# Patient Record
Sex: Male | Born: 1965 | Race: White | Hispanic: No | Marital: Married | Smoking: Current every day smoker
Health system: Southern US, Community
[De-identification: ages and names within clinical notes are randomized; demographics above are authoritative.]

## PROBLEM LIST (undated history)

## (undated) DIAGNOSIS — K219 Gastro-esophageal reflux disease without esophagitis: Secondary | ICD-10-CM

## (undated) DIAGNOSIS — Z8614 Personal history of Methicillin resistant Staphylococcus aureus infection: Secondary | ICD-10-CM

## (undated) DIAGNOSIS — R0602 Shortness of breath: Secondary | ICD-10-CM

## (undated) DIAGNOSIS — I471 Supraventricular tachycardia, unspecified: Secondary | ICD-10-CM

## (undated) DIAGNOSIS — M7110 Other infective bursitis, unspecified site: Secondary | ICD-10-CM

## (undated) DIAGNOSIS — J189 Pneumonia, unspecified organism: Secondary | ICD-10-CM

## (undated) DIAGNOSIS — M1990 Unspecified osteoarthritis, unspecified site: Secondary | ICD-10-CM

## (undated) DIAGNOSIS — Z9889 Other specified postprocedural states: Principal | ICD-10-CM

## (undated) DIAGNOSIS — J449 Chronic obstructive pulmonary disease, unspecified: Secondary | ICD-10-CM

## (undated) DIAGNOSIS — M0630 Rheumatoid nodule, unspecified site: Secondary | ICD-10-CM

## (undated) DIAGNOSIS — T7840XA Allergy, unspecified, initial encounter: Secondary | ICD-10-CM

## (undated) DIAGNOSIS — A4901 Methicillin susceptible Staphylococcus aureus infection, unspecified site: Secondary | ICD-10-CM

## (undated) HISTORY — DX: Personal history of Methicillin resistant Staphylococcus aureus infection: Z86.14

## (undated) HISTORY — PX: CHOLECYSTECTOMY: SHX55

## (undated) HISTORY — DX: Other infective bursitis, unspecified site: M71.10

## (undated) HISTORY — DX: Methicillin susceptible Staphylococcus aureus infection, unspecified site: A49.01

## (undated) HISTORY — DX: Gastro-esophageal reflux disease without esophagitis: K21.9

## (undated) HISTORY — DX: Other specified postprocedural states: Z98.890

## (undated) HISTORY — PX: COLONOSCOPY: SHX174

## (undated) HISTORY — PX: EPIDURAL BLOCK INJECTION: SHX1516

## (undated) HISTORY — PX: EYE SURGERY: SHX253

## (undated) HISTORY — DX: Rheumatoid nodule, unspecified site: M06.30

## (undated) HISTORY — PX: BACK SURGERY: SHX140

## (undated) HISTORY — PX: OTHER SURGICAL HISTORY: SHX169

## (undated) HISTORY — DX: Allergy, unspecified, initial encounter: T78.40XA

---

## 2000-09-15 ENCOUNTER — Emergency Department (HOSPITAL_COMMUNITY): Admission: EM | Admit: 2000-09-15 | Discharge: 2000-09-15 | Payer: Self-pay | Admitting: Internal Medicine

## 2003-08-11 ENCOUNTER — Encounter: Admission: RE | Admit: 2003-08-11 | Discharge: 2003-08-11 | Payer: Self-pay | Admitting: Family Medicine

## 2004-05-30 ENCOUNTER — Ambulatory Visit: Payer: Self-pay | Admitting: Internal Medicine

## 2004-10-22 ENCOUNTER — Encounter: Admission: RE | Admit: 2004-10-22 | Discharge: 2004-10-22 | Payer: Self-pay | Admitting: Family Medicine

## 2004-11-22 ENCOUNTER — Ambulatory Visit (HOSPITAL_COMMUNITY): Admission: RE | Admit: 2004-11-22 | Discharge: 2004-11-22 | Payer: Self-pay | Admitting: General Surgery

## 2005-01-03 ENCOUNTER — Ambulatory Visit (HOSPITAL_COMMUNITY): Admission: RE | Admit: 2005-01-03 | Discharge: 2005-01-03 | Payer: Self-pay | Admitting: General Surgery

## 2005-01-03 ENCOUNTER — Encounter (INDEPENDENT_AMBULATORY_CARE_PROVIDER_SITE_OTHER): Payer: Self-pay | Admitting: Specialist

## 2005-04-15 ENCOUNTER — Emergency Department (HOSPITAL_COMMUNITY): Admission: EM | Admit: 2005-04-15 | Discharge: 2005-04-15 | Payer: Self-pay | Admitting: Emergency Medicine

## 2005-11-28 ENCOUNTER — Emergency Department (HOSPITAL_COMMUNITY): Admission: EM | Admit: 2005-11-28 | Discharge: 2005-11-28 | Payer: Self-pay | Admitting: Emergency Medicine

## 2006-03-10 ENCOUNTER — Ambulatory Visit: Payer: Self-pay | Admitting: Gastroenterology

## 2006-03-26 ENCOUNTER — Ambulatory Visit: Payer: Self-pay | Admitting: Gastroenterology

## 2006-06-12 ENCOUNTER — Emergency Department (HOSPITAL_COMMUNITY): Admission: EM | Admit: 2006-06-12 | Discharge: 2006-06-12 | Payer: Self-pay | Admitting: Emergency Medicine

## 2009-05-18 ENCOUNTER — Ambulatory Visit (HOSPITAL_COMMUNITY): Admission: RE | Admit: 2009-05-18 | Discharge: 2009-05-18 | Payer: Self-pay | Admitting: Rheumatology

## 2010-08-23 NOTE — Op Note (Signed)
NAME:  Jeff Russell, Jeff Russell               ACCOUNT NO.:  [id number]   MEDICAL RECORD NO.:  [id number]          PATIENT TYPE:  AMB   LOCATION:  DAY                          FACILITY:  [facility]   PHYSICIAN:  Leonie Man, M.D.   DATE OF BIRTH:  [birth year]   DATE OF PROCEDURE:  01/03/2005  DATE OF DISCHARGE:                                 OPERATIVE REPORT   PREOPERATIVE DIAGNOSIS:  Chronic cholecystitis and biliary dyskinesia.   POSTOPERATIVE DIAGNOSIS:  Chronic cholecystitis and biliary dyskinesia.   PROCEDURE:  Laparoscopic cholecystectomy with intraoperative cholangiogram.   SURGEON:  Leonie Man, M.D.   ASSISTANT:  Consuello Bossier, M.D.   ANESTHESIA:  General.   SPECIMENS TO LAB:  Gallbladder.   ESTIMATED BLOOD LOSS:  Minimal.   COMPLICATIONS:  None.   The patient returned to the PACU in excellent condition.   HISTORY:  The patient is a 45 year old man presenting with epigastric pain  and associated nausea. He had an ultrasound which showed sludge and some  gallbladder wall thickening. Gallbladder function studies showed he had a  gallbladder ejection fraction of approximately 20 percent at 1 hour and at 4  hours. The patient comes to the operation today for laparoscopic  cholecystectomy and cholangiogram after the risks and potential benefits of  surgery have been fully discussed with him all, questions answered and  consent obtained.   DESCRIPTION OF PROCEDURE:  Following induction of satisfactory general  anesthesia with the patient positioned supinely, the abdomen was routinely  prepped and draped to be included in a sterile operative field. Open  laparoscopy created at the umbilicus with insertion of a Hassan cannula and  insufflation of the peritoneal cavity to 14 mmHg pressure using carbon  dioxide. Camera was inserted and the patient positioned. Visual exploration  of the abdomen showed the gallbladder be chronically scarred. The liver  edges were sharp, liver  surfaces smooth, duodenal sweep and anterior gastric  wall appeared to be normal. There were no abnormalities noted within the  area of the small bowel or large intestine noted. Under direct vision,  epigastric and lateral ports were placed gallbladder was grasped and  retracted cephalad. Dissection carried down to the region of the ampulla  with isolation of the cystic artery and cystic duct, the cystic duct being  traced up to the gallbladder cystic duct junction and cystic artery being  traced to its entry into the gallbladder wall. The cystic duct was clipped  proximally and opened, the cystic duct cholangiogram was done by passing a  Cook catheter into the abdomen into the cystic duct through which I injected  one-half strength Hypaque dye under fluoroscopic guidance. The resulting  cholangiogram showed free flow of contrast into the duodenum, normal caliber  ducts and no filling defects. The cholangiocatheter was removed and the  cystic duct triply clipped and transected. The cystic artery isolated,  triply clipped and transected and the gallbladder dissected free from the  liver bed using electrocautery and maintaining hemostasis throughout the  course of the dissection. At the end of this dissection, the liver bed was  again thoroughly checked  for hemostasis and additional bleeding points  treated electrocautery. The gallbladder was placed in an Endopouch and  retrieved through the umbilical wound without difficulty. The right upper  quadrant was thoroughly irrigated and aspirated. The sponge, instrument and  sharp counts were verified and the pneumoperitoneum released after the  trocars were removed under direct vision. The wounds were then closed in  layers as follows. The umbilical wound in two layers with #0 Vicryl and 4-0  Monocryl, epigastric and lateral flank wounds closed with 4-0 Monocryl  sutures. All wounds were reinforced with Steri-Strips and sterile dressings  applied.  Anesthetic reversed. The patient removed from the operating room to  the recovery room in stable condition. He tolerated the procedure well.      Leonie Man, M.D.  Electronically Signed     PB/MEDQ  D:  01/03/2005  T:  01/03/2005  Job:  [id number]

## 2010-10-02 ENCOUNTER — Ambulatory Visit
Admission: RE | Admit: 2010-10-02 | Discharge: 2010-10-02 | Disposition: A | Discharge status: Home / Self Care | Payer: BC Managed Care – PPO | Source: Ambulatory Visit | Attending: Rheumatology | Admitting: Rheumatology

## 2010-10-02 ENCOUNTER — Other Ambulatory Visit: Payer: Self-pay | Admitting: Rheumatology

## 2010-10-02 DIAGNOSIS — M0520 Rheumatoid vasculitis with rheumatoid arthritis of unspecified site: Secondary | ICD-10-CM

## 2011-01-14 ENCOUNTER — Other Ambulatory Visit: Payer: Self-pay | Admitting: Family Medicine

## 2011-01-14 ENCOUNTER — Ambulatory Visit
Admission: RE | Admit: 2011-01-14 | Discharge: 2011-01-14 | Disposition: A | Discharge status: Home / Self Care | Payer: BC Managed Care – PPO | Source: Ambulatory Visit | Attending: Family Medicine | Admitting: Family Medicine

## 2011-03-24 ENCOUNTER — Encounter (HOSPITAL_COMMUNITY): Payer: Self-pay

## 2011-03-24 ENCOUNTER — Encounter (HOSPITAL_COMMUNITY): Payer: Self-pay | Admitting: General Practice

## 2011-03-24 ENCOUNTER — Inpatient Hospital Stay (HOSPITAL_COMMUNITY)
Admission: AD | Admit: 2011-03-24 | Discharge: 2011-03-28 | DRG: 264 | Disposition: A | Discharge status: Home Health | Payer: BC Managed Care – PPO | Source: Ambulatory Visit | Attending: Orthopedic Surgery | Admitting: Orthopedic Surgery

## 2011-03-24 ENCOUNTER — Other Ambulatory Visit: Payer: Self-pay

## 2011-03-24 ENCOUNTER — Other Ambulatory Visit: Payer: Self-pay | Admitting: Physician Assistant

## 2011-03-24 ENCOUNTER — Encounter (HOSPITAL_COMMUNITY)
Admission: AD | Disposition: A | Discharge status: Home Health | Payer: Self-pay | Source: Ambulatory Visit | Attending: Orthopedic Surgery

## 2011-03-24 ENCOUNTER — Inpatient Hospital Stay (HOSPITAL_COMMUNITY): Payer: BC Managed Care – PPO

## 2011-03-24 DIAGNOSIS — M76899 Other specified enthesopathies of unspecified lower limb, excluding foot: Secondary | ICD-10-CM | POA: Diagnosis present

## 2011-03-24 DIAGNOSIS — K219 Gastro-esophageal reflux disease without esophagitis: Secondary | ICD-10-CM | POA: Diagnosis present

## 2011-03-24 DIAGNOSIS — M00869 Arthritis due to other bacteria, unspecified knee: Secondary | ICD-10-CM

## 2011-03-24 DIAGNOSIS — L988 Other specified disorders of the skin and subcutaneous tissue: Secondary | ICD-10-CM | POA: Diagnosis present

## 2011-03-24 DIAGNOSIS — F17200 Nicotine dependence, unspecified, uncomplicated: Secondary | ICD-10-CM | POA: Diagnosis present

## 2011-03-24 DIAGNOSIS — R0602 Shortness of breath: Secondary | ICD-10-CM | POA: Diagnosis present

## 2011-03-24 DIAGNOSIS — L03119 Cellulitis of unspecified part of limb: Principal | ICD-10-CM | POA: Diagnosis present

## 2011-03-24 DIAGNOSIS — A4901 Methicillin susceptible Staphylococcus aureus infection, unspecified site: Secondary | ICD-10-CM | POA: Diagnosis present

## 2011-03-24 DIAGNOSIS — L02419 Cutaneous abscess of limb, unspecified: Principal | ICD-10-CM | POA: Diagnosis present

## 2011-03-24 DIAGNOSIS — M069 Rheumatoid arthritis, unspecified: Secondary | ICD-10-CM | POA: Diagnosis present

## 2011-03-24 HISTORY — PX: I&D EXTREMITY: SHX5045

## 2011-03-24 HISTORY — DX: Pneumonia, unspecified organism: J18.9

## 2011-03-24 HISTORY — DX: Unspecified osteoarthritis, unspecified site: M19.90

## 2011-03-24 HISTORY — DX: Shortness of breath: R06.02

## 2011-03-24 LAB — URINE MICROSCOPIC-ADD ON

## 2011-03-24 LAB — SURGICAL PCR SCREEN: MRSA, PCR: NEGATIVE

## 2011-03-24 LAB — URINALYSIS, ROUTINE W REFLEX MICROSCOPIC
Glucose, UA: NEGATIVE mg/dL
Ketones, ur: NEGATIVE mg/dL
Nitrite: NEGATIVE
Specific Gravity, Urine: 1.025 (ref 1.005–1.030)
pH: 7 (ref 5.0–8.0)

## 2011-03-24 LAB — DIFFERENTIAL
Basophils Relative: 0 % (ref 0–1)
Lymphocytes Relative: 10 % — ABNORMAL LOW (ref 12–46)
Lymphs Abs: 1.4 10*3/uL (ref 0.7–4.0)
Monocytes Absolute: 1.1 10*3/uL — ABNORMAL HIGH (ref 0.1–1.0)
Monocytes Relative: 8 % (ref 3–12)
Neutro Abs: 11.7 10*3/uL — ABNORMAL HIGH (ref 1.7–7.7)
Neutrophils Relative %: 82 % — ABNORMAL HIGH (ref 43–77)

## 2011-03-24 LAB — PROTIME-INR: INR: 1.08 (ref 0.00–1.49)

## 2011-03-24 LAB — CBC
HCT: 42.5 % (ref 39.0–52.0)
MCHC: 33.9 g/dL (ref 30.0–36.0)
MCV: 99.1 fL (ref 78.0–100.0)
Platelets: 216 10*3/uL (ref 150–400)
RDW: 12.9 % (ref 11.5–15.5)
WBC: 14.3 10*3/uL — ABNORMAL HIGH (ref 4.0–10.5)

## 2011-03-24 LAB — COMPREHENSIVE METABOLIC PANEL
AST: 24 U/L (ref 0–37)
Albumin: 3.7 g/dL (ref 3.5–5.2)
BUN: 9 mg/dL (ref 6–23)
Calcium: 9.8 mg/dL (ref 8.4–10.5)
Chloride: 99 mEq/L (ref 96–112)
Creatinine, Ser: 0.93 mg/dL (ref 0.50–1.35)
Total Bilirubin: 0.6 mg/dL (ref 0.3–1.2)
Total Protein: 8.4 g/dL — ABNORMAL HIGH (ref 6.0–8.3)

## 2011-03-24 LAB — APTT: aPTT: 29 seconds (ref 24–37)

## 2011-03-24 SURGERY — IRRIGATION AND DEBRIDEMENT EXTREMITY
Anesthesia: General | Site: Knee | Laterality: Right | Wound class: Dirty or Infected

## 2011-03-24 MED ORDER — HYDROMORPHONE 0.3 MG/ML IV SOLN
INTRAVENOUS | Status: DC
  Administered 2011-03-24: 22:00:00 via INTRAVENOUS
  Administered 2011-03-25: 1.8 mg via INTRAVENOUS
  Administered 2011-03-25 (×2): 0.9 mg via INTRAVENOUS
  Administered 2011-03-25: 19:00:00 via INTRAVENOUS
  Administered 2011-03-25: 1.13 mg via INTRAVENOUS
  Administered 2011-03-25 (×2): 0.6 mg via INTRAVENOUS
  Administered 2011-03-25: 2.7 mg via INTRAVENOUS
  Administered 2011-03-26 (×3): 0.6 mg via INTRAVENOUS
  Administered 2011-03-27: 0.9 mg via INTRAVENOUS
  Filled 2011-03-24 (×2): qty 25

## 2011-03-24 MED ORDER — METOCLOPRAMIDE HCL 10 MG PO TABS: 5.0000 mg | ORAL_TABLET | Freq: Three times a day (TID) | ORAL | Status: DC | PRN

## 2011-03-24 MED ORDER — ONDANSETRON HCL 4 MG/2ML IJ SOLN: 4.0000 mg | Freq: Four times a day (QID) | INTRAMUSCULAR | Status: DC | PRN

## 2011-03-24 MED ORDER — SODIUM CHLORIDE 0.9 % IJ SOLN: 9.0000 mL | INTRAMUSCULAR | Status: DC | PRN

## 2011-03-24 MED ORDER — MORPHINE SULFATE 2 MG/ML IJ SOLN: 0.0500 mg/kg | INTRAMUSCULAR | Status: DC | PRN

## 2011-03-24 MED ORDER — DIPHENHYDRAMINE HCL 12.5 MG/5ML PO ELIX
12.5000 mg | ORAL_SOLUTION | ORAL | Status: DC | PRN
Start: 2011-03-24 — End: 2011-03-28
  Filled 2011-03-24: qty 10

## 2011-03-24 MED ORDER — POLYETHYLENE GLYCOL 3350 17 G PO PACK
17.0000 g | PACK | Freq: Every day | ORAL | Status: DC | PRN
  Administered 2011-03-25: 17 g via ORAL
  Filled 2011-03-24 (×2): qty 1

## 2011-03-24 MED ORDER — SODIUM CHLORIDE 0.9 % IR SOLN
Status: DC | PRN
  Administered 2011-03-24: 1000 mL

## 2011-03-24 MED ORDER — MAGNESIUM CITRATE PO SOLN
1.0000 | Freq: Once | ORAL | Status: AC | PRN
  Filled 2011-03-24: qty 296

## 2011-03-24 MED ORDER — ONDANSETRON HCL 4 MG/2ML IJ SOLN
INTRAMUSCULAR | Status: DC | PRN
  Administered 2011-03-24: 4 mg via INTRAVENOUS

## 2011-03-24 MED ORDER — SODIUM CHLORIDE 0.9 % IV SOLN
INTRAVENOUS | Status: DC
  Administered 2011-03-24 – 2011-03-27 (×4): via INTRAVENOUS

## 2011-03-24 MED ORDER — VANCOMYCIN HCL 1000 MG IV SOLR
1000.0000 mg | INTRAVENOUS | Status: DC | PRN
  Administered 2011-03-24: 1000 mg via INTRAVENOUS

## 2011-03-24 MED ORDER — DIPHENHYDRAMINE HCL 12.5 MG/5ML PO ELIX
12.5000 mg | ORAL_SOLUTION | Freq: Four times a day (QID) | ORAL | Status: DC | PRN
  Filled 2011-03-24: qty 5

## 2011-03-24 MED ORDER — LACTATED RINGERS IV SOLN
INTRAVENOUS | Status: DC | PRN
  Administered 2011-03-24: 17:00:00 via INTRAVENOUS

## 2011-03-24 MED ORDER — DIPHENHYDRAMINE HCL 50 MG/ML IJ SOLN: 12.5000 mg | Freq: Four times a day (QID) | INTRAMUSCULAR | Status: DC | PRN

## 2011-03-24 MED ORDER — VANCOMYCIN HCL IN DEXTROSE 1-5 GM/200ML-% IV SOLN
INTRAVENOUS | Status: AC
  Filled 2011-03-24: qty 200

## 2011-03-24 MED ORDER — CHLORHEXIDINE GLUCONATE 4 % EX LIQD
60.0000 mL | Freq: Once | CUTANEOUS | Status: AC
  Administered 2011-03-24: 4 via TOPICAL
  Filled 2011-03-24: qty 60

## 2011-03-24 MED ORDER — MIDAZOLAM HCL 5 MG/5ML IJ SOLN
INTRAMUSCULAR | Status: DC | PRN
  Administered 2011-03-24: 2 mg via INTRAVENOUS

## 2011-03-24 MED ORDER — PROPOFOL 10 MG/ML IV EMUL
INTRAVENOUS | Status: DC | PRN
  Administered 2011-03-24: 200 mg via INTRAVENOUS

## 2011-03-24 MED ORDER — DOCUSATE SODIUM 100 MG PO CAPS
100.0000 mg | ORAL_CAPSULE | Freq: Two times a day (BID) | ORAL | Status: DC
  Administered 2011-03-24 – 2011-03-28 (×8): 100 mg via ORAL
  Filled 2011-03-24 (×9): qty 1

## 2011-03-24 MED ORDER — SENNA 8.6 MG PO TABS
1.0000 | ORAL_TABLET | Freq: Two times a day (BID) | ORAL | Status: DC
  Administered 2011-03-24 – 2011-03-28 (×8): 8.6 mg via ORAL
  Filled 2011-03-24 (×9): qty 1

## 2011-03-24 MED ORDER — LACTATED RINGERS IV SOLN
INTRAVENOUS | Status: DC
  Administered 2011-03-24: 16:00:00 via INTRAVENOUS

## 2011-03-24 MED ORDER — FENTANYL CITRATE 0.05 MG/ML IJ SOLN
INTRAMUSCULAR | Status: DC | PRN
  Administered 2011-03-24 (×4): 50 ug via INTRAVENOUS

## 2011-03-24 MED ORDER — PROMETHAZINE HCL 25 MG/ML IJ SOLN: 6.2500 mg | INTRAMUSCULAR | Status: DC | PRN

## 2011-03-24 MED ORDER — NALOXONE HCL 0.4 MG/ML IJ SOLN: 0.4000 mg | INTRAMUSCULAR | Status: DC | PRN

## 2011-03-24 MED ORDER — HYDROMORPHONE HCL PF 1 MG/ML IJ SOLN
0.2500 mg | INTRAMUSCULAR | Status: DC | PRN
  Administered 2011-03-24 (×2): 0.5 mg via INTRAVENOUS

## 2011-03-24 MED ORDER — METOCLOPRAMIDE HCL 5 MG/ML IJ SOLN
5.0000 mg | Freq: Three times a day (TID) | INTRAMUSCULAR | Status: DC | PRN
  Filled 2011-03-24: qty 2

## 2011-03-24 MED ORDER — SODIUM CHLORIDE 0.9 % IR SOLN
Status: DC | PRN
  Administered 2011-03-24: 3000 mL

## 2011-03-24 MED ORDER — ONDANSETRON HCL 4 MG PO TABS: 4.0000 mg | ORAL_TABLET | Freq: Four times a day (QID) | ORAL | Status: DC | PRN

## 2011-03-24 MED ORDER — VANCOMYCIN HCL IN DEXTROSE 1-5 GM/200ML-% IV SOLN
1000.0000 mg | Freq: Two times a day (BID) | INTRAVENOUS | Status: DC
  Administered 2011-03-25: 1000 mg via INTRAVENOUS
  Filled 2011-03-24 (×2): qty 200

## 2011-03-24 MED ORDER — LACTATED RINGERS IV SOLN: INTRAVENOUS | Status: DC

## 2011-03-24 MED ORDER — FOLIC ACID 1 MG PO TABS
1.0000 mg | ORAL_TABLET | Freq: Two times a day (BID) | ORAL | Status: DC
  Administered 2011-03-24 – 2011-03-28 (×8): 1 mg via ORAL
  Filled 2011-03-24 (×9): qty 1

## 2011-03-24 MED ORDER — MIDAZOLAM HCL 2 MG/2ML IJ SOLN: 0.5000 mg | Freq: Once | INTRAMUSCULAR | Status: DC | PRN

## 2011-03-24 MED ORDER — HYDROCODONE-ACETAMINOPHEN 5-325 MG PO TABS
1.0000 | ORAL_TABLET | ORAL | Status: DC | PRN
  Administered 2011-03-24: 1 via ORAL
  Filled 2011-03-24: qty 1

## 2011-03-24 MED ORDER — ENOXAPARIN SODIUM 30 MG/0.3ML ~~LOC~~ SOLN
30.0000 mg | Freq: Two times a day (BID) | SUBCUTANEOUS | Status: DC
  Administered 2011-03-24 – 2011-03-28 (×8): 30 mg via SUBCUTANEOUS
  Filled 2011-03-24 (×11): qty 0.3

## 2011-03-24 MED ORDER — HYDROMORPHONE HCL PF 1 MG/ML IJ SOLN: 0.2500 mg | INTRAMUSCULAR | Status: DC | PRN

## 2011-03-24 MED ORDER — MEPERIDINE HCL 25 MG/ML IJ SOLN: 6.2500 mg | INTRAMUSCULAR | Status: DC | PRN

## 2011-03-24 MED ORDER — NICOTINE 14 MG/24HR TD PT24
14.0000 mg | MEDICATED_PATCH | Freq: Every day | TRANSDERMAL | Status: DC
  Administered 2011-03-25 – 2011-03-28 (×5): 14 mg via TRANSDERMAL
  Filled 2011-03-24 (×5): qty 1

## 2011-03-24 SURGICAL SUPPLY — 51 items
BANDAGE ELASTIC 4 VELCRO ST LF (GAUZE/BANDAGES/DRESSINGS) IMPLANT
BANDAGE ELASTIC 6 VELCRO ST LF (GAUZE/BANDAGES/DRESSINGS) IMPLANT
BANDAGE ESMARK 6X9 LF (GAUZE/BANDAGES/DRESSINGS) IMPLANT
BANDAGE GAUZE ELAST BULKY 4 IN (GAUZE/BANDAGES/DRESSINGS) IMPLANT
BLADE SURG 10 STRL SS (BLADE) ×2 IMPLANT
BNDG COHESIVE 4X5 TAN STRL (GAUZE/BANDAGES/DRESSINGS) ×2 IMPLANT
BNDG ESMARK 6X9 LF (GAUZE/BANDAGES/DRESSINGS)
CANISTER WOUND CARE 500ML ATS (WOUND CARE) ×2 IMPLANT
CLOTH BEACON ORANGE TIMEOUT ST (SAFETY) ×2 IMPLANT
CUFF TOURNIQUET SINGLE 18IN (TOURNIQUET CUFF) ×2 IMPLANT
CUFF TOURNIQUET SINGLE 24IN (TOURNIQUET CUFF) IMPLANT
CUFF TOURNIQUET SINGLE 34IN LL (TOURNIQUET CUFF) ×2 IMPLANT
CUFF TOURNIQUET SINGLE 44IN (TOURNIQUET CUFF) IMPLANT
DRAPE INCISE IOBAN 66X45 STRL (DRAPES) ×2 IMPLANT
DRAPE ORTHO SPLIT 77X108 STRL (DRAPES) ×2
DRAPE SURG ORHT 6 SPLT 77X108 (DRAPES) ×2 IMPLANT
DRAPE U-SHAPE 47X51 STRL (DRAPES) ×2 IMPLANT
DRSG ADAPTIC 3X8 NADH LF (GAUZE/BANDAGES/DRESSINGS) IMPLANT
DRSG EMULSION OIL 3X3 NADH (GAUZE/BANDAGES/DRESSINGS) IMPLANT
DRSG PAD ABDOMINAL 8X10 ST (GAUZE/BANDAGES/DRESSINGS) IMPLANT
DRSG VAC ATS SM SENSATRAC (GAUZE/BANDAGES/DRESSINGS) ×4 IMPLANT
ELECT REM PT RETURN 9FT ADLT (ELECTROSURGICAL)
ELECTRODE REM PT RTRN 9FT ADLT (ELECTROSURGICAL) IMPLANT
GLOVE BIOGEL PI IND STRL 6.5 (GLOVE) ×1 IMPLANT
GLOVE BIOGEL PI IND STRL 8 (GLOVE) ×2 IMPLANT
GLOVE BIOGEL PI INDICATOR 6.5 (GLOVE) ×1
GLOVE BIOGEL PI INDICATOR 8 (GLOVE) ×2
GLOVE ORTHO TXT STRL SZ7.5 (GLOVE) ×4 IMPLANT
GLOVE SURG ORTHO 8.0 STRL STRW (GLOVE) ×4 IMPLANT
GOWN PREVENTION PLUS XLARGE (GOWN DISPOSABLE) ×4 IMPLANT
GOWN STRL NON-REIN LRG LVL3 (GOWN DISPOSABLE) ×4 IMPLANT
HANDPIECE INTERPULSE COAX TIP (DISPOSABLE)
KIT BASIN OR (CUSTOM PROCEDURE TRAY) ×2 IMPLANT
KIT ROOM TURNOVER OR (KITS) ×2 IMPLANT
MANIFOLD NEPTUNE II (INSTRUMENTS) ×2 IMPLANT
NS IRRIG 1000ML POUR BTL (IV SOLUTION) ×2 IMPLANT
PACK ORTHO EXTREMITY (CUSTOM PROCEDURE TRAY) ×2 IMPLANT
PAD ARMBOARD 7.5X6 YLW CONV (MISCELLANEOUS) ×4 IMPLANT
PADDING CAST COTTON 6X4 STRL (CAST SUPPLIES) IMPLANT
SET HNDPC FAN SPRY TIP SCT (DISPOSABLE) IMPLANT
SPONGE GAUZE 4X4 12PLY (GAUZE/BANDAGES/DRESSINGS) IMPLANT
SPONGE LAP 18X18 X RAY DECT (DISPOSABLE) ×2 IMPLANT
SPONGE LAP 4X18 X RAY DECT (DISPOSABLE) IMPLANT
STOCKINETTE IMPERVIOUS 9X36 MD (GAUZE/BANDAGES/DRESSINGS) ×2 IMPLANT
TOWEL OR 17X24 6PK STRL BLUE (TOWEL DISPOSABLE) ×2 IMPLANT
TOWEL OR 17X26 10 PK STRL BLUE (TOWEL DISPOSABLE) ×2 IMPLANT
TUBE ANAEROBIC SPECIMEN COL (MISCELLANEOUS) IMPLANT
TUBE CONNECTING 12X1/4 (SUCTIONS) ×2 IMPLANT
UNDERPAD 30X30 INCONTINENT (UNDERPADS AND DIAPERS) ×2 IMPLANT
WATER STERILE IRR 1000ML POUR (IV SOLUTION) ×2 IMPLANT
YANKAUER SUCT BULB TIP NO VENT (SUCTIONS) ×2 IMPLANT

## 2011-03-24 NOTE — H&P (View-Only) (Signed)
Jeff Russell is an 45 y.o. male.   Chief Complaint: Infected Right Knee HPI: Patient is a 45 yo male with history of RA, presented to the orthopaedic urgent care this weekend with erythema, drainage from his right knee.  States he first became symptomatic Thursday of last week ( 03/20/11) with some erythema he then hyperflexed the knee and the skin broke open with copious, malodorous drainage.  It was recommended he be admitted in the for surgical intervention this weekend but patient refused, he presented to the office today, now positive labs showing abundant gram positive cocci in pairs and clusters.  Patient is taking Doxycycline started 03/21/11.  He has stopped his methotrexate.    PMH: Rheumatoid arhritis, GERD No past surgical history on file. Gallbladder, Eye surgery,   No family history on file. Prostate Cancer  Social History: Married, smoker, works in lawn care  Allergies: NKDA  No current outpatient prescriptions on file as of 03/24/2011.     No current facility-administered medications on file as of 03/24/2011.  Current Medications: Sulfasalazine, methotrexate, plaquenil, folic acid, zegerid, hydrocodone, tramadol, arthrotec  No results found for this or any previous visit (from the past 48 hour(s)). No results found.  Review of Systems  Constitutional: Positive for chills. Negative for fever.  HENT: Negative.   Eyes: Negative.   Respiratory: Positive for cough and shortness of breath.   Cardiovascular: Negative.   Gastrointestinal: Negative.   Genitourinary: Negative.   Musculoskeletal: Positive for joint pain.  Skin:       Redness and drainage with foul odor right knee  Neurological: Negative.     There were no vitals taken for this visit. Physical Exam  Constitutional: He appears well-developed.  HENT:  Head: Normocephalic.  Eyes: EOM are normal. Pupils are equal, round, and reactive to light.  Neck: Normal range of motion. Neck supple.    Musculoskeletal: He exhibits tenderness.       Right knee: He exhibits decreased range of motion, swelling and erythema. tenderness found.  Skin: There is erythema.        Assessment/Plan Infected right knee  Plan is for OR today, I&D possible wound vac application.  We will consult ID for antibiotic recommendation.  Risks and benefits of surgery discussed with patient and he wishes to proceed.    Jeff Russell 03/24/2011, 9:36 AM    

## 2011-03-24 NOTE — Transfer of Care (Signed)
Immediate Anesthesia Transfer of Care Note  Patient: Jeff Russell  Procedure(s) Performed:  IRRIGATION AND DEBRIDEMENT EXTREMITY - Irrigation and debridement right knee abcess and Bursa,  application of wound vac  Patient Location: PACU  Anesthesia Type: General  Level of Consciousness: awake, alert , oriented and patient cooperative  Airway & Oxygen Therapy: Patient Spontanous Breathing and Patient connected to face mask  Post-op Assessment: Report given to PACU RN, Post -op Vital signs reviewed and stable and Patient moving all extremities X 4  Post vital signs: Reviewed and stable  Complications: No apparent anesthesia complications

## 2011-03-24 NOTE — Anesthesia Postprocedure Evaluation (Signed)
  Anesthesia Post-op Note  Patient: Jeff Russell  Procedure(s) Performed:  IRRIGATION AND DEBRIDEMENT EXTREMITY - Irrigation and debridement right knee abcess and Bursa,  application of wound vac  Patient Location: PACU  Anesthesia Type: General  Level of Consciousness: awake  Airway and Oxygen Therapy: Patient Spontanous Breathing  Post-op Pain: mild  Post-op Assessment: Post-op Vital signs reviewed  Post-op Vital Signs: stable  Complications: No apparent anesthesia complications

## 2011-03-24 NOTE — Interval H&P Note (Signed)
History and Physical Interval Note:  03/24/2011 4:51 PM  Jeff Russell  has presented today for surgery, with the diagnosis of right knee infection  The various methods of treatment have been discussed with the patient and family. After consideration of risks, benefits and other options for treatment, the patient has consented to  Procedure(s): IRRIGATION AND DEBRIDEMENT EXTREMITY as a surgical intervention .  The patients' history has been reviewed, patient examined, no change in status, stable for surgery.  I have reviewed the patients' chart and labs.  Questions were answered to the patient's satisfaction.     Dorthula Bier JR,W D

## 2011-03-24 NOTE — Interval H&P Note (Signed)
History and Physical Interval Note:  03/24/2011 4:54 PM  Jeff Russell  has presented today for surgery, with the diagnosis of right knee infection  The various methods of treatment have been discussed with the patient and family. After consideration of risks, benefits and other options for treatment, the patient has consented to  Procedure(s): IRRIGATION AND DEBRIDEMENT EXTREMITY as a surgical intervention .  The patients' history has been reviewed, patient examined, no change in status, stable for surgery.  I have reviewed the patients' chart and labs.  Questions were answered to the patient's satisfaction.     [name],W D

## 2011-03-24 NOTE — Op Note (Signed)
Dictated [phone]

## 2011-03-24 NOTE — Anesthesia Preprocedure Evaluation (Addendum)
Anesthesia Evaluation  Patient identified by MRN, date of birth, ID band Patient awake    Reviewed: Allergy & Precautions, H&P , NPO status , Patient's Chart, lab work & pertinent test results, reviewed documented beta blocker date and time   Airway Mallampati: II TM Distance: >3 FB   Mouth opening: Limited Mouth Opening  Dental  (+) Teeth Intact and Dental Advisory Given   Pulmonary shortness of breath, with exertion, at rest and lying, pneumonia , Current Smoker,  Sob past 3 days per pt.         Cardiovascular neg cardio ROS     Neuro/Psych    GI/Hepatic GERD-  Medicated,  Endo/Other  Negative Endocrine ROS  Renal/GU negative Renal ROS     Musculoskeletal  (+) Arthritis -, Rheumatoid disorders,    Abdominal   Peds  Hematology negative hematology ROS (+)   Anesthesia Other Findings   Reproductive/Obstetrics negative OB ROS                           Anesthesia Physical Anesthesia Plan  ASA: II  Anesthesia Plan: General   Post-op Pain Management:    Induction: Intravenous  Airway Management Planned: LMA  Additional Equipment:   Intra-op Plan:   Post-operative Plan: Extubation in OR  Informed Consent: I have reviewed the patients History and Physical, chart, labs and discussed the procedure including the risks, benefits and alternatives for the proposed anesthesia with the patient or authorized representative who has indicated his/her understanding and acceptance.     Plan Discussed with: CRNA and Surgeon  Anesthesia Plan Comments:         Anesthesia Quick Evaluation

## 2011-03-24 NOTE — H&P (Signed)
Jeff Russell is an 45 y.o. male.   Chief Complaint: Infected Right Knee HPI: Patient is a 45 yo male with history of RA, presented to the orthopaedic urgent care this weekend with erythema, drainage from his right knee.  States he first became symptomatic Thursday of last week ( 03/20/11) with some erythema he then hyperflexed the knee and the skin broke open with copious, malodorous drainage.  It was recommended he be admitted in the for surgical intervention this weekend but patient refused, he presented to the office today, now positive labs showing abundant gram positive cocci in pairs and clusters.  Patient is taking Doxycycline started 03/21/11.  He has stopped his methotrexate.    PMH: Rheumatoid arhritis, GERD No past surgical history on file. Gallbladder, Eye surgery,   No family history on file. Prostate Cancer  Social History: Married, smoker, works in Therapist, music care  Allergies: NKDA  No current outpatient prescriptions on file as of 03/24/2011.     No current facility-administered medications on file as of 03/24/2011.  Current Medications: Sulfasalazine, methotrexate, plaquenil, folic acid, zegerid, hydrocodone, tramadol, arthrotec  No results found for this or any previous visit (from the past 48 hour(s)). No results found.  Review of Systems  Constitutional: Positive for chills. Negative for fever.  HENT: Negative.   Eyes: Negative.   Respiratory: Positive for cough and shortness of breath.   Cardiovascular: Negative.   Gastrointestinal: Negative.   Genitourinary: Negative.   Musculoskeletal: Positive for joint pain.  Skin:       Redness and drainage with foul odor right knee  Neurological: Negative.     There were no vitals taken for this visit. Physical Exam  Constitutional: He appears well-developed.  HENT:  Head: Normocephalic.  Eyes: EOM are normal. Pupils are equal, round, and reactive to light.  Neck: Normal range of motion. Neck supple.    Musculoskeletal: He exhibits tenderness.       Right knee: He exhibits decreased range of motion, swelling and erythema. tenderness found.  Skin: There is erythema.        Assessment/Plan Infected right knee  Plan is for OR today, I&D possible wound vac application.  We will consult ID for antibiotic recommendation.  Risks and benefits of surgery discussed with patient and he wishes to proceed.    Margart Sickles 03/24/2011, 9:36 AM

## 2011-03-24 NOTE — Brief Op Note (Signed)
03/24/2011  6:16 PM  PATIENT:  Jeff Russell  45 y.o. male  PRE-OPERATIVE DIAGNOSIS:  right knee infection  POST-OPERATIVE DIAGNOSIS:  right knee infection  PROCEDURE:  Procedure(s): IRRIGATION AND DEBRIDEMENT EXTREMITY  SURGEON:  Surgeon(s): Thera Flake., MD  PHYSICIAN ASSISTANT:   ASSISTANTS: Margart Sickles, PA-C  ANESTHESIA:   general  EBL:  Total I/O In: 1000 [I.V.:1000] Out: -   BLOOD ADMINISTERED:none  DRAINS: Wound Vac continuous suction 125  LOCAL MEDICATIONS USED:  NONE  SPECIMEN:  No Specimen  DISPOSITION OF SPECIMEN:  N/A  COUNTS:  YES  TOURNIQUET:   Total Tourniquet Time Documented: Thigh (Right) - 22 minutes  DICTATION: .Other Dictation: Dictation Number   PLAN OF CARE: Admit to inpatient   PATIENT DISPOSITION:  PACU - hemodynamically stable.   Delay start of Pharmacological VTE agent (>24hrs) due to surgical blood loss or risk of bleeding:  {YES/NO/NOT APPLICABLE:20182

## 2011-03-25 ENCOUNTER — Encounter (HOSPITAL_COMMUNITY): Payer: Self-pay | Admitting: Orthopedic Surgery

## 2011-03-25 DIAGNOSIS — A4901 Methicillin susceptible Staphylococcus aureus infection, unspecified site: Secondary | ICD-10-CM

## 2011-03-25 DIAGNOSIS — L089 Local infection of the skin and subcutaneous tissue, unspecified: Secondary | ICD-10-CM

## 2011-03-25 LAB — URINE CULTURE
Colony Count: NO GROWTH
Culture  Setup Time: 201212171704

## 2011-03-25 LAB — COMPREHENSIVE METABOLIC PANEL
ALT: 30 U/L (ref 0–53)
AST: 23 U/L (ref 0–37)
Alkaline Phosphatase: 133 U/L — ABNORMAL HIGH (ref 39–117)
CO2: 26 mEq/L (ref 19–32)
Calcium: 8.9 mg/dL (ref 8.4–10.5)
GFR calc Af Amer: >90 mL/min (ref >90)
Glucose, Bld: 102 mg/dL — ABNORMAL HIGH (ref 70–99)
Potassium: 3.6 mEq/L (ref 3.5–5.1)
Sodium: 137 mEq/L (ref 135–145)
Total Protein: 6.8 g/dL (ref 6.0–8.3)

## 2011-03-25 MED ORDER — SULFASALAZINE 500 MG PO TBEC
1000.0000 mg | DELAYED_RELEASE_TABLET | Freq: Two times a day (BID) | ORAL | Status: DC
  Administered 2011-03-25 – 2011-03-28 (×7): 1000 mg via ORAL
  Filled 2011-03-25 (×8): qty 2

## 2011-03-25 MED ORDER — PANTOPRAZOLE SODIUM 40 MG PO TBEC
40.0000 mg | DELAYED_RELEASE_TABLET | Freq: Every day | ORAL | Status: DC
  Administered 2011-03-25 – 2011-03-28 (×4): 40 mg via ORAL
  Filled 2011-03-25 (×4): qty 1

## 2011-03-25 MED ORDER — HYDROXYCHLOROQUINE SULFATE 200 MG PO TABS
200.0000 mg | ORAL_TABLET | Freq: Two times a day (BID) | ORAL | Status: DC
  Administered 2011-03-25 – 2011-03-28 (×7): 200 mg via ORAL
  Filled 2011-03-25 (×8): qty 1

## 2011-03-25 MED ORDER — DEXTROSE 5 % IV SOLN
2000.0000 mg | Freq: Three times a day (TID) | INTRAVENOUS | Status: DC
  Administered 2011-03-25 – 2011-03-28 (×10): 2000 mg via INTRAVENOUS
  Filled 2011-03-25 (×12): qty 20

## 2011-03-25 NOTE — Consult Note (Addendum)
Date of Admission:  03/24/2011  Date of Consult:  03/25/2011  Reason for Consult: MSSA soft tissue infection right knee (but not in joint)_ Referring Physician: Dr. Madelon Lips   HPI: Jeff Russell is an 45 y.o. male with hx of RA on methotrexate, who presented with worsening pain, erythema and spontaneous drainage of purulent material from right knee. States he first became symptomatic Thursday of last week ( 03/20/11) with some erythema he then hyperflexed the knee and the skin broke open with copious, malodorous drainage. He was seen in orthopedics office and culture obtained from purulent abscess that is now growing MSSA (PCN r only). He was brought to the OR yesterday and underwent I and D with cultures sent.    Past Medical History  Diagnosis Date  . Shortness of breath   . Pneumonia   . Arthritis     Past Surgical History  Procedure Date  . Cholecystectomy   . Carpel tunnel   . I&d extremity 03/24/2011    Procedure: IRRIGATION AND DEBRIDEMENT EXTREMITY;  Surgeon: Thera Flake., MD;  Location: [facility] OR;  Service: Orthopedics;  Laterality: Right;  Irrigation and debridement right knee abcess and Bursa,  application of wound vac  ergies:   No Known Allergies   Medications: I have reviewed patients current medications as documented in Epic Anti-infectives     Start     Dose/Rate Route Frequency Ordered Stop   03/25/11 1400   ceFAZolin (ANCEF) 2,000 mg in dextrose 5 % 50 mL IVPB        2,000 mg 140 mL/hr over 30 Minutes Intravenous 3 times per day 03/25/11 1245     03/25/11 1030  hydroxychloroquine (PLAQUENIL) tablet 200 mg       200 mg Oral 2 times daily 03/25/11 0940     03/25/11 0530   vancomycin (VANCOCIN) IVPB 1000 mg/200 mL premix  Status:  Discontinued        1,000 mg 200 mL/hr over 60 Minutes Intravenous Every 12 hours 03/24/11 2050 03/25/11 1144          Social History:  reports that he has been smoking Cigarettes.  He has a 60 pack-year smoking history. He has  never used smokeless tobacco. He reports that he does not drink alcohol or use illicit drugs.  History reviewed. No pertinent family history.  As in HPI and primary teams notes otherwise 12 point review of systems is negative  Blood pressure 109/69, pulse 84, temperature 98.5 F (36.9 C), temperature source Oral, resp. rate 18, height 6\' 3"  (1.905 m), weight 240 lb (108.863 kg), SpO2 93.00%. General: Alert and awake, oriented x3, not in any acute distress. HEENT: anicteric sclera, pupils reactive to light and accommodation, EOMI, oropharynx clear and without exudate CVS regular rate, normal r,  no murmur rubs or gallops Chest: clear to auscultation bilaterally, no wheezing, rales or rhonchi Abdomen: soft nontender, nondistended, normal bowel sounds, Extremities: right side with wound vaccuum, Skin: excoriated areas on hands bilaterallly Neuro: nonfocal, strength and sensation intact   Results for orders placed during the hospital encounter of 03/24/11 (from the past 48 hour(s))  APTT     Status: Normal   Collection Time   03/24/11 10:45 AM      Component Value Range Comment   aPTT 29  24 - 37 (seconds)   CBC     Status: Abnormal   Collection Time   03/24/11 10:45 AM      Component Value Range Comment  WBC 14.3 (*) 4.0 - 10.5 (K/uL)    RBC 4.29  4.22 - 5.81 (MIL/uL)    Hemoglobin 14.4  13.0 - 17.0 (g/dL)    HCT 16.1  09.6 - 04.5 (%)    MCV 99.1  78.0 - 100.0 (fL)    MCH 33.6  26.0 - 34.0 (pg)    MCHC 33.9  30.0 - 36.0 (g/dL)    RDW 40.9  81.1 - 91.4 (%)    Platelets 216  150 - 400 (K/uL)   COMPREHENSIVE METABOLIC PANEL     Status: Abnormal   Collection Time   03/24/11 10:45 AM      Component Value Range Comment   Sodium 137  135 - 145 (mEq/L)    Potassium 3.7  3.5 - 5.1 (mEq/L)    Chloride 99  96 - 112 (mEq/L)    CO2 27  19 - 32 (mEq/L)    Glucose, Bld 91  70 - 99 (mg/dL)    BUN 9  6 - 23 (mg/dL)    Creatinine, Ser 7.82  0.50 - 1.35 (mg/dL)    Calcium 9.8  8.4 - 10.5  (mg/dL)    Total Protein 8.4 (*) 6.0 - 8.3 (g/dL)    Albumin 3.7  3.5 - 5.2 (g/dL)    AST 24  0 - 37 (U/L)    ALT 30  0 - 53 (U/L)    Alkaline Phosphatase 167 (*) 39 - 117 (U/L)    Total Bilirubin 0.6  0.3 - 1.2 (mg/dL)    GFR calc non Af Amer >90  >90 (mL/min)    GFR calc Af Amer >90  >90 (mL/min)   DIFFERENTIAL     Status: Abnormal   Collection Time   03/24/11 10:45 AM      Component Value Range Comment   Neutrophils Relative 82 (*) 43 - 77 (%)    Neutro Abs 11.7 (*) 1.7 - 7.7 (K/uL)    Lymphocytes Relative 10 (*) 12 - 46 (%)    Lymphs Abs 1.4  0.7 - 4.0 (K/uL)    Monocytes Relative 8  3 - 12 (%)    Monocytes Absolute 1.1 (*) 0.1 - 1.0 (K/uL)    Eosinophils Relative 0  0 - 5 (%)    Eosinophils Absolute 0.0  0.0 - 0.7 (K/uL)    Basophils Relative 0  0 - 1 (%)    Basophils Absolute 0.0  0.0 - 0.1 (K/uL)   PROTIME-INR     Status: Normal   Collection Time   03/24/11 10:45 AM      Component Value Range Comment   Prothrombin Time 14.2  11.6 - 15.2 (seconds)    INR 1.08  0.00 - 1.49    SURGICAL PCR SCREEN     Status: Normal   Collection Time   03/24/11 10:48 AM      Component Value Range Comment   MRSA, PCR NEGATIVE  NEGATIVE     Staphylococcus aureus NEGATIVE  NEGATIVE    URINALYSIS, ROUTINE W REFLEX MICROSCOPIC     Status: Abnormal   Collection Time   03/24/11  4:01 PM      Component Value Range Comment   Color, Urine AMBER (*) YELLOW  BIOCHEMICALS MAY BE AFFECTED BY COLOR   APPearance CLEAR  CLEAR     Specific Gravity, Urine 1.025  1.005 - 1.030     pH 7.0  5.0 - 8.0     Glucose, UA NEGATIVE  NEGATIVE (mg/dL)  Hgb urine dipstick NEGATIVE  NEGATIVE     Bilirubin Urine SMALL (*) NEGATIVE     Ketones, ur NEGATIVE  NEGATIVE (mg/dL)    Protein, ur 30 (*) NEGATIVE (mg/dL)    Urobilinogen, UA 1.0  0.0 - 1.0 (mg/dL)    Nitrite NEGATIVE  NEGATIVE     Leukocytes, UA TRACE (*) NEGATIVE    URINE MICROSCOPIC-ADD ON     Status: Normal   Collection Time   03/24/11  4:01 PM       Component Value Range Comment   Squamous Epithelial / LPF RARE  RARE     WBC, UA 0-2  <3 (WBC/hpf)    Bacteria, UA RARE  RARE     Urine-Other MUCOUS PRESENT     COMPREHENSIVE METABOLIC PANEL     Status: Abnormal   Collection Time   03/25/11  6:15 AM      Component Value Range Comment   Sodium 137  135 - 145 (mEq/L)    Potassium 3.6  3.5 - 5.1 (mEq/L)    Chloride 102  96 - 112 (mEq/L)    CO2 26  19 - 32 (mEq/L)    Glucose, Bld 102 (*) 70 - 99 (mg/dL)    BUN 10  6 - 23 (mg/dL)    Creatinine, Ser 1.61  0.50 - 1.35 (mg/dL)    Calcium 8.9  8.4 - 10.5 (mg/dL)    Total Protein 6.8  6.0 - 8.3 (g/dL)    Albumin 2.8 (*) 3.5 - 5.2 (g/dL)    AST 23  0 - 37 (U/L)    ALT 30  0 - 53 (U/L)    Alkaline Phosphatase 133 (*) 39 - 117 (U/L)    Total Bilirubin 0.5  0.3 - 1.2 (mg/dL)    GFR calc non Af Amer >90  >90 (mL/min)    GFR calc Af Amer >90  >90 (mL/min)    No results found for this basename: sdes, specrequest, cult, reptstatus   Dg Chest Portable 1 View  03/24/2011  *RADIOLOGY REPORT*  Clinical Data: Preop, cough, short of breath, smoking history  PORTABLE CHEST - 1 VIEW  Comparison: Chest x-ray of 01/14/2011  Findings: Basilar linear atelectasis and scarring is present.  The lungs are not optimally aerated.  No focal infiltrate or effusion is seen.  The heart is mildly enlarged and stable.  No bony abnormality is seen.  IMPRESSION: Suboptimal inspiration with bibasilar atelectasis and/or scarring. No definite active process.  Original Report Authenticated By: Juline Patch, M.D.     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

## 2011-03-25 NOTE — Progress Notes (Signed)
Subjective: 1 Day Post-Op Procedure(s) (LRB): IRRIGATION AND DEBRIDEMENT EXTREMITY (Right) Patient reports pain as mild.    Objective: Vital signs in last 24 hours: Temp:  [97.9 F (36.6 C)-100.7 F (38.2 C)] 97.9 F (36.6 C) (12/18 0600) Pulse Rate:  [68-106] 68  (12/18 0600) Resp:  [16-23] 20  (12/18 0859) BP: (101-134)/(57-75) 101/57 mmHg (12/18 0600) SpO2:  [90 %-98 %] 93 % (12/18 0859) FiO2 (%):  [32 %] 32 % (12/18 0025) Weight:  [108.863 kg (240 lb)] 240 lb (108.863 kg) (12/17 1550)  Intake/Output from previous day: 12/17 0701 - 12/18 0700 In: 1000 [I.V.:1000] Out: 400 [Urine:400] Intake/Output this shift:     Basename 03/24/11 1045  HGB 14.4    Basename 03/24/11 1045  WBC 14.3*  RBC 4.29  HCT 42.5  PLT 216    Basename 03/25/11 0615 03/24/11 1045  NA 137 137  K 3.6 3.7  CL 102 99  CO2 26 27  BUN 10 9  CREATININE 0.86 0.93  GLUCOSE 102* 91  CALCIUM 8.9 9.8    Basename 03/24/11 1045  LABPT --  INR 1.08    Neurovascular intact Sensation intact distally Intact pulses distally Dorsiflexion/Plantar flexion intact Incision: wound vac in place, functioning properly, little output  Assessment/Plan: 1 Day Post-Op Procedure(s) (LRB): IRRIGATION AND DEBRIDEMENT EXTREMITY (Right) Continue antibiotic therapy, pt on vancomycin for abundant staph aureus hx of MRSA in same leg Consult ID for antibiotic rec's and continued treatment Plan for wound vac take down and possible skin closure 03/28/11 with Dr. Madelon Lips Pain control as needed Start his RA meds as ordered Continue TDWB RLE with wound vac   Jeff Russell 03/25/2011, 9:58 AM

## 2011-03-25 NOTE — Op Note (Signed)
NAME:  Jeff Russell, Jeff Russell               ACCOUNT NO.:  [id number]  MEDICAL RECORD NO.:  [id number]  LOCATION:  5006                         FACILITY:  [facility]  PHYSICIAN:  Dyke Brackett, M.D.    DATE OF BIRTH:  1966/03/11  DATE OF PROCEDURE:  03/24/2011 DATE OF DISCHARGE:                              OPERATIVE REPORT   INDICATION:  This is a 45 year old patient of mine who I have not seen. He had been seen in Urgent Care this past Thursday and whole Friday, and over the weekend he had been advised that he should be admitted to the hospital for definitive I and D, but he deferred that despite clear advice.  He presented to my office on Monday morning at approximately 8:30 with a fluctuant abscess in the front of his leg.  He has a history being on methotrexate and Plaquenil.  We immediately advised admission for preoperative evaluation, IV antibiotics and I and D.  PREOPERATIVE DIAGNOSIS:  Severe infection of right knee with septic bursitis with extension into soft tissues with necrotic tissue.  POSTOPERATIVE DIAGNOSIS:  Severe infection of right knee with septic bursitis with extension into soft tissues with necrotic tissue.  OPERATION: 1. I and D abscess of leg. 2. I and D bursa of leg. 3. Application of VAC.  SURGEON:  Dyke Brackett, MD  ASSISTANT:  Margart Sickles, PA-C  TOURNIQUET TIME:  Approximately 35 minutes.  DESCRIPTION OF PROCEDURE:  Sterile prep and drape, elevation of legs, with inflation of the tourniquet to 250 without exsanguination. Necrotic skin tissue was debrided.  Midline incision was made.  Large amount of bloody purulent material was expressed, and then we irrigated this with 3000 mL of pulsatile lavage.  Aggressive debridement of the soft tissues was carried out, excising necrotic the subcutaneous tissues that were aggressively debrided and this was a severe soft tissue abscess with no obvious extension into the joint.  We then applied a VAC and a light  compressive sterile dressing, taken to recovery room in stable condition.     Dyke Brackett, M.D.     WDC/MEDQ  D:  03/24/2011  T:  03/25/2011  Job:  [id number]

## 2011-03-25 NOTE — Consult Note (Signed)
ANTIBIOTIC CONSULT NOTE - INITIAL  Pharmacy Consult for Ancef (Cefazolin) Indication: S/P  I & D Infected Right Knee  No Known Allergies  Patient Measurements: Height: 6\' 3"  (190.5 cm) Weight: 240 lb (108.863 kg) IBW/kg (Calculated) : 84.5    Vital Signs: Temp: 97.9 F (36.6 C) (12/18 0600) Temp src: Oral (12/18 0600) BP: 101/57 mmHg (12/18 0600) Pulse Rate: 68  (12/18 0600) Intake/Output from previous day: 12/17 0701 - 12/18 0700 In: 1000 [I.V.:1000] Out: 400 [Urine:400] Intake/Output from this shift:    Labs:  Basename 03/25/11 0615 03/24/11 1045  WBC -- 14.3*  HGB -- 14.4  PLT -- 216  LABCREA -- --  CREATININE 0.86 0.93   Estimated Creatinine Clearance: 144.7 ml/min (by C-G formula based on Cr of 0.86).  Microbiology: Recent Results (from the past 720 hour(s))  SURGICAL PCR SCREEN     Status: Normal   Collection Time   03/24/11 10:48 AM      Component Value Range Status Comment   MRSA, PCR NEGATIVE  NEGATIVE  Final    Staphylococcus aureus NEGATIVE  NEGATIVE  Final     Medical History: Past Medical History  Diagnosis Date  . Shortness of breath   . Pneumonia   . Arthritis     Medications:  Prescriptions prior to admission  Medication Sig Dispense Refill  . folic acid (FOLVITE) 1 MG tablet Take 1 mg by mouth 2 (two) times daily.        Marland Kitchen DISCONTD: aspirin EC 81 MG tablet Take 81 mg by mouth daily.        Marland Kitchen DISCONTD: diclofenac-misoprostol (ARTHROTEC 75) 75-200 MG-MCG per tablet Take 1 tablet by mouth 2 (two) times daily.        Marland Kitchen DISCONTD: doxycycline (VIBRA-TABS) 100 MG tablet Take 100 mg by mouth 3 (three) times daily.        Marland Kitchen DISCONTD: HYDROcodone-acetaminophen (LORTAB) 10-500 MG per tablet Take 1 tablet by mouth at bedtime.        Marland Kitchen DISCONTD: hydroxychloroquine (PLAQUENIL) 200 MG tablet Take 200 mg by mouth 2 (two) times daily.        Marland Kitchen DISCONTD: Omeprazole-Sodium Bicarbonate (ZEGERID PO) Take 1 tablet by mouth daily.        Marland Kitchen DISCONTD:  sulfaSALAzine (AZULFIDINE) 500 MG tablet Take 500 mg by mouth 2 (two) times daily.        Marland Kitchen DISCONTD: traMADol (ULTRAM) 50 MG tablet Take 50 mg by mouth every 8 (eight) hours as needed. Maximum dose= 8 tablets per day. As needed for pain.       Marland Kitchen DISCONTD: methotrexate (RHEUMATREX) 2.5 MG tablet Take 2.5 mg by mouth every Wednesday. Caution:Chemotherapy. Protect from light.        Scheduled:    . docusate sodium  100 mg Oral BID  . enoxaparin  30 mg Subcutaneous Q12H  . folic acid  1 mg Oral BID  . HYDROmorphone PCA 0.3 mg/mL   Intravenous Q4H  . hydroxychloroquine  200 mg Oral BID  . nicotine  14 mg Transdermal Daily  . pantoprazole  40 mg Oral Q1200  . senna  1 tablet Oral BID  . sulfaSALAzine  1,000 mg Oral BID  . DISCONTD: vancomycin  1,000 mg Intravenous Q12H   Infusions:    . sodium chloride 75 mL/hr at 03/24/11 2207  . DISCONTD: lactated ringers 20 mL/hr at 03/24/11 1618  . DISCONTD: lactated ringers     Anti-infectives     Start  Dose/Rate Route Frequency Ordered Stop   03/25/11 1030   hydroxychloroquine (PLAQUENIL) tablet 200 mg        200 mg Oral 2 times daily 03/25/11 0940     03/25/11 0530   vancomycin (VANCOCIN) IVPB 1000 mg/200 mL premix  Status:  Discontinued        1,000 mg 200 mL/hr over 60 Minutes Intravenous Every 12 hours 03/24/11 2050 03/25/11 1144         Assessment: 1.  Patient is a 45 y/o male s/p Irrigation and Debridement R-knee infection.  He has been on Doxycycline prior to admission. 2.  MRSA PCR negative, Staph Aureus negative.    Plan:  1.  Begin Ancef 2 gms IV q 8 hours. 2.  Follow cultures/sensitivities.  Velda Shell J 03/25/2011,12:33 PM

## 2011-03-26 LAB — COMPREHENSIVE METABOLIC PANEL
AST: 24 U/L (ref 0–37)
BUN: 8 mg/dL (ref 6–23)
CO2: 27 mEq/L (ref 19–32)
Calcium: 9 mg/dL (ref 8.4–10.5)
Chloride: 104 mEq/L (ref 96–112)
Creatinine, Ser: 0.85 mg/dL (ref 0.50–1.35)
GFR calc Af Amer: >90 mL/min (ref >90)
GFR calc non Af Amer: >90 mL/min (ref >90)
Total Bilirubin: 0.2 mg/dL — ABNORMAL LOW (ref 0.3–1.2)

## 2011-03-26 LAB — HIV ANTIBODY (ROUTINE TESTING W REFLEX): HIV: NONREACTIVE

## 2011-03-26 LAB — C-REACTIVE PROTEIN: CRP: 10.57 mg/dL — ABNORMAL HIGH (ref <0.60)

## 2011-03-26 LAB — SEDIMENTATION RATE: Sed Rate: 51 mm/hr — ABNORMAL HIGH (ref 0–16)

## 2011-03-26 NOTE — Progress Notes (Signed)
Subjective: No new complaints  Objective: Weight change:   Intake/Output Summary (Last 24 hours) at 03/26/11 1830 Last data filed at 03/26/11 1100  Gross per 24 hour  Intake 4266.25 ml  Output   1050 ml  Net 3216.25 ml   Blood pressure 104/69, pulse 76, temperature 97.8 F (36.6 C), temperature source Oral, resp. rate 18, height 6\' 3"  (1.905 m), weight 240 lb (108.863 kg), SpO2 96.00%. Temp:  [97.8 F (36.6 C)-98 F (36.7 C)] 97.8 F (36.6 C) (12/19 1256) Pulse Rate:  [76] 76  (12/19 0530) Resp:  [18-20] 18  (12/19 1256) BP: (104-113)/(65-69) 104/69 mmHg (12/19 1256) SpO2:  [95 %-97 %] 96 % (12/19 1256)  Physical Exam: General: Alert and awake, oriented x3, not in any acute distress. HEENT: anicteric sclera, pupils reactive to light and accommodation, EOMI CVS regular rate, normal r,  no murmur rubs or gallops Chest: clear to auscultation bilaterally, no wheezing, rales or rhonchi Abdomen: soft nontender, nondistended, normal bowel sounds, Extremities: no  clubbing or edema noted bilaterally Skin: no rashes Lymph: no new lymphadenopathy Neuro: nonfocal  Lab Results:  Basename 03/24/11 1045  WBC 14.3*  HGB 14.4  HCT 42.5  PLT 216   BMET  Basename 03/26/11 0522 03/25/11 0615  NA 139 137  K 3.6 3.6  CL 104 102  CO2 27 26  GLUCOSE 93 102*  BUN 8 10  CREATININE 0.85 0.86  CALCIUM 9.0 8.9    Micro Results: Recent Results (from the past 240 hour(s))  SURGICAL PCR SCREEN     Status: Normal   Collection Time   03/24/11 10:48 AM      Component Value Range Status Comment   MRSA, PCR NEGATIVE  NEGATIVE  Final    Staphylococcus aureus NEGATIVE  NEGATIVE  Final   URINE CULTURE     Status: Normal   Collection Time   03/24/11  4:01 PM      Component Value Range Status Comment   Specimen Description URINE, CLEAN CATCH   Final    Special Requests Immunocompromised   Final    Setup Time [id number]   Final    Colony Count NO GROWTH   Final    Culture NO GROWTH    Final    Report Status 03/25/2011 FINAL   Final   CULTURE, BLOOD (ROUTINE X 2)     Status: Normal (Preliminary result)   Collection Time   03/24/11  5:30 PM      Component Value Range Status Comment   Specimen Description BLOOD ARM RIGHT   Final    Special Requests BOTTLES DRAWN AEROBIC AND ANAEROBIC 10CC   Final    Setup Time [id number]   Final    Culture     Final    Value:        BLOOD CULTURE RECEIVED NO GROWTH TO DATE CULTURE WILL BE HELD FOR 5 DAYS BEFORE ISSUING A FINAL NEGATIVE REPORT   Report Status PENDING   Incomplete   CULTURE, BLOOD (ROUTINE X 2)     Status: Normal (Preliminary result)   Collection Time   03/24/11  6:25 PM      Component Value Range Status Comment   Specimen Description BLOOD RIGHT ARM   Final    Special Requests BOTTLES DRAWN AEROBIC AND ANAEROBIC 10CC   Final    Setup Time [id number]   Final    Culture     Final    Value:        BLOOD  CULTURE RECEIVED NO GROWTH TO DATE CULTURE WILL BE HELD FOR 5 DAYS BEFORE ISSUING A FINAL NEGATIVE REPORT   Report Status PENDING   Incomplete     Studies/Results: Dg Chest Portable 1 View  03/24/2011  *RADIOLOGY REPORT*  Clinical Data: Preop, cough, short of breath, smoking history  PORTABLE CHEST - 1 VIEW  Comparison: Chest x-ray of 01/14/2011  Findings: Basilar linear atelectasis and scarring is present.  The lungs are not optimally aerated.  No focal infiltrate or effusion is seen.  The heart is mildly enlarged and stable.  No bony abnormality is seen.  IMPRESSION: Suboptimal inspiration with bibasilar atelectasis and/or scarring. No definite active process.  Original Report Authenticated By: Juline Patch, M.D.    Antibiotics:  Anti-infectives     Start     Dose/Rate Route Frequency Ordered Stop   03/25/11 1400   ceFAZolin (ANCEF) 2,000 mg in dextrose 5 % 50 mL IVPB        2,000 mg 140 mL/hr over 30 Minutes Intravenous 3 times per day 03/25/11 1245     03/25/11 1030   hydroxychloroquine (PLAQUENIL) tablet 200  mg        200 mg Oral 2 times daily 03/25/11 0940     03/25/11 0530   vancomycin (VANCOCIN) IVPB 1000 mg/200 mL premix  Status:  Discontinued        1,000 mg 200 mL/hr over 60 Minutes Intravenous Every 12 hours 03/24/11 2050 03/25/11 1144          Medications: Scheduled Meds:   . ceFAZolin (ANCEF) IVPB - custom  2,000 mg Intravenous Q8H  . docusate sodium  100 mg Oral BID  . enoxaparin  30 mg Subcutaneous Q12H  . folic acid  1 mg Oral BID  . HYDROmorphone PCA 0.3 mg/mL   Intravenous Q4H  . hydroxychloroquine  200 mg Oral BID  . nicotine  14 mg Transdermal Daily  . pantoprazole  40 mg Oral Q1200  . senna  1 tablet Oral BID  . sulfaSALAzine  1,000 mg Oral BID   Continuous Infusions:   . sodium chloride 75 mL/hr at 03/26/11 1658   PRN Meds:.diphenhydrAMINE, diphenhydrAMINE, diphenhydrAMINE, metoCLOPramide (REGLAN) injection, metoCLOPramide, naloxone, ondansetron (ZOFRAN) IV, ondansetron (ZOFRAN) IV, ondansetron, polyethylene glycol, sodium chloride  Assessment/Plan: Jeff Russell is a 45 y.o. 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

## 2011-03-26 NOTE — Progress Notes (Signed)
Subjective: 2 Days Post-Op Procedure(s) (LRB): IRRIGATION AND DEBRIDEMENT EXTREMITY (Right) Patient reports pain as mild.    Objective: Vital signs in last 24 hours: Temp:  [98 F (36.7 C)-98.5 F (36.9 C)] 98 F (36.7 C) (12/19 0530) Pulse Rate:  [76-84] 76  (12/19 0530) Resp:  [18-20] 20  (12/19 0530) BP: (109-113)/(65-69) 113/65 mmHg (12/19 0530) SpO2:  [93 %-97 %] 96 % (12/19 0530)  Intake/Output from previous day: 12/18 0701 - 12/19 0700 In: 3426.3 [P.O.:960; I.V.:2466.3] Out: 500 [Urine:500] Intake/Output this shift:     Basename 03/24/11 1045  HGB 14.4    Basename 03/24/11 1045  WBC 14.3*  RBC 4.29  HCT 42.5  PLT 216    Basename 03/26/11 0522 03/25/11 0615  NA 139 137  K 3.6 3.6  CL 104 102  CO2 27 26  BUN 8 10  CREATININE 0.85 0.86  GLUCOSE 93 102*  CALCIUM 9.0 8.9    Basename 03/24/11 1045  LABPT --  INR 1.08    Neurovascular intact Sensation intact distally Dorsiflexion/Plantar flexion intact Incision: wound vac in place and working properly, minimal output  Assessment/Plan: 2 Days Post-Op Procedure(s) (LRB): IRRIGATION AND DEBRIDEMENT EXTREMITY (Right) Continue ABX per ID rec's  Plan for OR Friday wound vac removal, repeat I&D, possible closure  Christine Morton 03/26/2011, 8:33 AM

## 2011-03-27 LAB — COMPREHENSIVE METABOLIC PANEL
AST: 24 U/L (ref 0–37)
Albumin: 2.9 g/dL — ABNORMAL LOW (ref 3.5–5.2)
BUN: 7 mg/dL (ref 6–23)
Calcium: 9.3 mg/dL (ref 8.4–10.5)
Creatinine, Ser: 0.82 mg/dL (ref 0.50–1.35)
Total Bilirubin: 0.2 mg/dL — ABNORMAL LOW (ref 0.3–1.2)
Total Protein: 6.4 g/dL (ref 6.0–8.3)

## 2011-03-27 LAB — CBC
HCT: 38.8 % — ABNORMAL LOW (ref 39.0–52.0)
MCHC: 33 g/dL (ref 30.0–36.0)
MCV: 99.5 fL (ref 78.0–100.0)
RDW: 12.8 % (ref 11.5–15.5)
WBC: 7.4 10*3/uL (ref 4.0–10.5)

## 2011-03-27 LAB — BASIC METABOLIC PANEL
BUN: 7 mg/dL (ref 6–23)
CO2: 27 mEq/L (ref 19–32)
Chloride: 101 mEq/L (ref 96–112)
Creatinine, Ser: 0.84 mg/dL (ref 0.50–1.35)

## 2011-03-27 LAB — TYPE AND SCREEN

## 2011-03-27 MED ORDER — OXYCODONE HCL 5 MG PO TABS: 10.0000 mg | ORAL_TABLET | ORAL | Status: DC | PRN

## 2011-03-27 MED ORDER — SODIUM CHLORIDE 0.9 % IJ SOLN
10.0000 mL | INTRAMUSCULAR | Status: DC | PRN
  Administered 2011-03-28: 10 mL

## 2011-03-27 MED ORDER — HYDROMORPHONE HCL PF 1 MG/ML IJ SOLN: 1.0000 mg | INTRAMUSCULAR | Status: DC | PRN

## 2011-03-27 MED ORDER — OXYCODONE HCL 5 MG PO TABS
5.0000 mg | ORAL_TABLET | ORAL | Status: DC | PRN
  Administered 2011-03-28: 5 mg via ORAL
  Filled 2011-03-27: qty 1
  Filled 2011-03-27: qty 2

## 2011-03-27 NOTE — Progress Notes (Signed)
Utilization review completed. Makarios Madlock, RN, BSN. 03/27/11  

## 2011-03-27 NOTE — Discharge Summary (Signed)
PATIENT ID:      Jeff Russell  MRN:     [id number] DOB/AGE:    [birth year] / 45 y.o.     DISCHARGE SUMMARY  ADMISSION DATE:    2011-03-27 DISCHARGE DATE:   03/27/2011   ADMISSION DIAGNOSIS: right knee infection infection right knee  (right knee infection)  DISCHARGE DIAGNOSIS:  right knee infection    ADDITIONAL DIAGNOSIS: Active Problems:  * No active hospital problems. *   Past Medical History  Diagnosis Date  . Shortness of breath   . Pneumonia   . Arthritis     PROCEDURE: Procedure(s): IRRIGATION AND DEBRIDEMENT EXTREMITY on 27-Mar-2011 REPEAT IRRIGATION AND DEBRIDEMENT EXTREMITY on 03/28/2011  CONSULTS:   Infectious Disease  HISTORY:  See H&P in chart  HOSPITAL COURSE:  Jeff Russell is a 45 y.o. admitted on 03-27-11 and found to have a diagnosis of right knee infection.  After appropriate laboratory studies were obtained  they were taken to the operating room on 03-27-2011 and underwent Procedure(s): IRRIGATION AND DEBRIDEMENT EXTREMITY.   They were given perioperative antibiotics:  Anti-infectives     Start     Dose/Rate Route Frequency Ordered Stop   03/25/11 1400   ceFAZolin (ANCEF) 2,000 mg in dextrose 5 % 50 mL IVPB        2,000 mg 140 mL/hr over 30 Minutes Intravenous 3 times per day 03/25/11 1245     03/25/11 1030  hydroxychloroquine (PLAQUENIL) tablet 200 mg       200 mg Oral 2 times daily 03/25/11 0940     03/25/11 0530   vancomycin (VANCOCIN) IVPB 1000 mg/200 mL premix  Status:  Discontinued        1,000 mg 200 mL/hr over 60 Minutes Intravenous Every 12 hours 03/27/2011 2050 03/25/11 1144        . Blood products given:none  A wound vac was placed in the OR which remained on with continuous suction until repeat I&D on 03/28/11.  The rest of the hospital course consisted of pain control and IV antibiotics as recommended by ID.  Home IV access was placed.     DIAGNOSTIC STUDIES: Recent vital signs: Patient Vitals for the past 24 hrs:  BP Temp  Temp src Pulse Resp SpO2  03/27/11 1400 123/77 mmHg 97.7 F (36.5 C) Oral 89  18  96 %  03/27/11 0844 - - - - 18  -  03/27/11 0600 117/69 mmHg 98 F (36.7 C) Oral 78  18  96 %  03/27/11 0410 - - - - 20  97 %  03/27/11 0200 115/71 mmHg 98.5 F (36.9 C) Oral 83  18  96 %  03/26/11 2358 - - - - 20  98 %  03/26/11 2000 122/80 mmHg 98.1 F (36.7 C) Oral 85  19  95 %       Recent laboratory studies:  Basename 03/27/11 1631 03-27-11 1045  WBC 7.4 14.3*  HGB 12.8* 14.4  HCT 38.8* 42.5  PLT 229 216    Basename 03/27/11 1631 03/27/11 0615 03/26/11 0522 03/25/11 0615 03-27-2011 1045  NA 136 138 139 137 137  K 3.7 3.9 3.6 3.6 3.7  CL 101 104 104 102 99  CO2 27 25 27 26 27   BUN 7 7 8 10 9   CREATININE 0.84 0.82 0.85 0.86 0.93  GLUCOSE 112* 95 93 102* 91  CALCIUM 9.1 9.3 9.0 8.9 9.8   Lab Results  Component Value Date   INR 1.08 03-27-11  Recent Radiographic Studies :  Dg Chest Portable 1 View  03/24/2011  *RADIOLOGY REPORT*  Clinical Data: Preop, cough, short of breath, smoking history  PORTABLE CHEST - 1 VIEW  Comparison: Chest x-ray of 01/14/2011  Findings: Basilar linear atelectasis and scarring is present.  The lungs are not optimally aerated.  No focal infiltrate or effusion is seen.  The heart is mildly enlarged and stable.  No bony abnormality is seen.  IMPRESSION: Suboptimal inspiration with bibasilar atelectasis and/or scarring. No definite active process.  Original Report Authenticated By: Juline Patch, M.D.    DISCHARGE INSTRUCTIONS: Discharge Orders    Future Appointments: Provider: Department: Dept Phone: Center:   04/17/2011 2:00 PM Acey Lav, MD [facility] [id number] [facility]      DISCHARGE MEDICATIONS:  Current Discharge Medication List    CONTINUE these medications which have NOT CHANGED   Details  folic acid (FOLVITE) 1 MG tablet Take 1 mg by mouth 2 (two) times daily.        STOP taking these medications     aspirin EC 81 MG tablet       diclofenac-misoprostol (ARTHROTEC 75) 75-200 MG-MCG per tablet      doxycycline (VIBRA-TABS) 100 MG tablet      HYDROcodone-acetaminophen (LORTAB) 10-500 MG per tablet      hydroxychloroquine (PLAQUENIL) 200 MG tablet      Omeprazole-Sodium Bicarbonate (ZEGERID PO)      sulfaSALAzine (AZULFIDINE) 500 MG tablet      traMADol (ULTRAM) 50 MG tablet      methotrexate (RHEUMATREX) 2.5 MG tablet         FOLLOW UP VISIT:   Follow-up Information    Follow up with Acey Lav, MD on 04/17/2011. (at 2pm)    Contact information:   [address] [place] [address] [phone]       Call CAFFREY JR,W D, MD. (03/31/11)    Contact information:   [facility]y [facility], [facility] [address] [place] [address] [phone]          DISPOSITION:  Home  Final discharge disposition not confirmed [facility] for [facility]. Patient will receive 3 weeks of IV ancef as recommended by ID, f/u with Dr. Daiva Eves.  Weekly lab draw CBC and BMP while on Ancef results to Dr. Daiva Eves.   F/u Dr. Madelon Lips on 03/31/11.  Planned D/C home 03/28/11 from PACU once stable.  CONDITION:  Stable   Margart Sickles 03/27/2011, 5:43 PM

## 2011-03-27 NOTE — Progress Notes (Signed)
Subjective: 3 Days Post-Op Procedure(s) (LRB): IRRIGATION AND DEBRIDEMENT EXTREMITY (Right) Patient reports pain as mild.    Objective: Vital signs in last 24 hours: Temp:  [98 F (36.7 C)-98.5 F (36.9 C)] 98 F (36.7 C) (12/20 0600) Pulse Rate:  [78-85] 78  (12/20 0600) Resp:  [18-20] 18  (12/20 0844) BP: (115-122)/(69-80) 117/69 mmHg (12/20 0600) SpO2:  [95 %-98 %] 96 % (12/20 0600)  Intake/Output from previous day: 12/19 0701 - 12/20 0700 In: 3280 [P.O.:1480; I.V.:1800] Out: 1550 [Urine:1550] Intake/Output this shift:    No results found for this basename: HGB:5 in the last 72 hours No results found for this basename: WBC:2,RBC:2,HCT:2,PLT:2 in the last 72 hours  Basename 03/27/11 0615 03/26/11 0522  NA 138 139  K 3.9 3.6  CL 104 104  CO2 25 27  BUN 7 8  CREATININE 0.82 0.85  GLUCOSE 95 93  CALCIUM 9.3 9.0   No results found for this basename: LABPT:2,INR:2 in the last 72 hours  Neurovascular intact Sensation intact distally Dorsiflexion/Plantar flexion intact Incision: wound vac in place functioning properly  Assessment/Plan: 3 Days Post-Op Procedure(s) (LRB): IRRIGATION AND DEBRIDEMENT EXTREMITY (Right) Plan for OR tomorrow for repeat I&D, possible closure Possible d/c home tomorrow with IV abx per ID rec's D/c PCA dilaudid, ordered po oxycodone with breakthrough ivp dilaudid NPO after midnight for OR tomorrow  Margart Sickles 03/27/2011, 1:52 PM

## 2011-03-27 NOTE — Progress Notes (Signed)
Subjective: No new complaints  Objective: Weight change:   Intake/Output Summary (Last 24 hours) at 03/27/11 1844 Last data filed at 03/27/11 1400  Gross per 24 hour  Intake   2440 ml  Output   1000 ml  Net   1440 ml   Blood pressure 123/77, pulse 89, temperature 97.7 F (36.5 C), temperature source Oral, resp. rate 18, height 6\' 3"  (1.905 m), weight 240 lb (108.863 kg), SpO2 96.00%. Temp:  [97.7 F (36.5 C)-98.5 F (36.9 C)] 97.7 F (36.5 C) (12/20 1400) Pulse Rate:  [78-89] 89  (12/20 1400) Resp:  [18-20] 18  (12/20 1400) BP: (115-123)/(69-80) 123/77 mmHg (12/20 1400) SpO2:  [95 %-98 %] 96 % (12/20 1400)  Physical Exam: General: Alert and awake, oriented x3, not in any acute distress. HEENT: anicteric sclera, pupils reactive to light and accommodation, EOMI CVS regular rate, normal r,  no murmur rubs or gallops Chest: clear to auscultation bilaterally, no wheezing, rales or rhonchi Abdomen: soft nontender, nondistended, normal bowel sounds, Extremities: no  clubbing or edema noted bilaterally Skin: no rashes Lymph: no new lymphadenopathy Neuro: nonfocal  Lab Results:  Basename 03/27/11 1631  WBC 7.4  HGB 12.8*  HCT 38.8*  PLT 229   BMET  Basename 03/27/11 1631 03/27/11 0615  NA 136 138  K 3.7 3.9  CL 101 104  CO2 27 25  GLUCOSE 112* 95  BUN 7 7  CREATININE 0.84 0.82  CALCIUM 9.1 9.3    Micro Results: Recent Results (from the past 240 hour(s))  SURGICAL PCR SCREEN     Status: Normal   Collection Time   03/24/11 10:48 AM      Component Value Range Status Comment   MRSA, PCR NEGATIVE  NEGATIVE  Final    Staphylococcus aureus NEGATIVE  NEGATIVE  Final   URINE CULTURE     Status: Normal   Collection Time   03/24/11  4:01 PM      Component Value Range Status Comment   Specimen Description URINE, CLEAN CATCH   Final    Special Requests Immunocompromised   Final    Setup Time [id number]   Final    Colony Count NO GROWTH   Final    Culture NO GROWTH    Final    Report Status 03/25/2011 FINAL   Final   CULTURE, BLOOD (ROUTINE X 2)     Status: Normal (Preliminary result)   Collection Time   03/24/11  5:30 PM      Component Value Range Status Comment   Specimen Description BLOOD ARM RIGHT   Final    Special Requests BOTTLES DRAWN AEROBIC AND ANAEROBIC 10CC   Final    Setup Time [id number]   Final    Culture     Final    Value:        BLOOD CULTURE RECEIVED NO GROWTH TO DATE CULTURE WILL BE HELD FOR 5 DAYS BEFORE ISSUING A FINAL NEGATIVE REPORT   Report Status PENDING   Incomplete   CULTURE, BLOOD (ROUTINE X 2)     Status: Normal (Preliminary result)   Collection Time   03/24/11  6:25 PM      Component Value Range Status Comment   Specimen Description BLOOD RIGHT ARM   Final    Special Requests BOTTLES DRAWN AEROBIC AND ANAEROBIC 10CC   Final    Setup Time [id number]   Final    Culture     Final    Value:  BLOOD CULTURE RECEIVED NO GROWTH TO DATE CULTURE WILL BE HELD FOR 5 DAYS BEFORE ISSUING A FINAL NEGATIVE REPORT   Report Status PENDING   Incomplete     Studies/Results: Dg Chest Portable 1 View  03/24/2011  *RADIOLOGY REPORT*  Clinical Data: Preop, cough, short of breath, smoking history  PORTABLE CHEST - 1 VIEW  Comparison: Chest x-ray of 01/14/2011  Findings: Basilar linear atelectasis and scarring is present.  The lungs are not optimally aerated.  No focal infiltrate or effusion is seen.  The heart is mildly enlarged and stable.  No bony abnormality is seen.  IMPRESSION: Suboptimal inspiration with bibasilar atelectasis and/or scarring. No definite active process.  Original Report Authenticated By: Juline Patch, M.D.    Antibiotics:  Anti-infectives     Start     Dose/Rate Route Frequency Ordered Stop   03/25/11 1400   ceFAZolin (ANCEF) 2,000 mg in dextrose 5 % 50 mL IVPB        2,000 mg 140 mL/hr over 30 Minutes Intravenous 3 times per day 03/25/11 1245     03/25/11 1030   hydroxychloroquine (PLAQUENIL) tablet  200 mg        200 mg Oral 2 times daily 03/25/11 0940     03/25/11 0530   vancomycin (VANCOCIN) IVPB 1000 mg/200 mL premix  Status:  Discontinued        1,000 mg 200 mL/hr over 60 Minutes Intravenous Every 12 hours 03/24/11 2050 03/25/11 1144          Medications: Scheduled Meds:    . ceFAZolin (ANCEF) IVPB - custom  2,000 mg Intravenous Q8H  . docusate sodium  100 mg Oral BID  . enoxaparin  30 mg Subcutaneous Q12H  . folic acid  1 mg Oral BID  . hydroxychloroquine  200 mg Oral BID  . nicotine  14 mg Transdermal Daily  . pantoprazole  40 mg Oral Q1200  . senna  1 tablet Oral BID  . sulfaSALAzine  1,000 mg Oral BID  . DISCONTD: HYDROmorphone PCA 0.3 mg/mL   Intravenous Q4H   Continuous Infusions:    . sodium chloride 75 mL/hr at 03/27/11 0610   PRN Meds:.diphenhydrAMINE, HYDROmorphone (DILAUDID) injection, metoCLOPramide (REGLAN) injection, metoCLOPramide, ondansetron (ZOFRAN) IV, ondansetron, oxyCODONE, polyethylene glycol, DISCONTD: diphenhydrAMINE, DISCONTD: diphenhydrAMINE, DISCONTD: naloxone, DISCONTD: ondansetron (ZOFRAN) IV, DISCONTD: oxyCODONE, DISCONTD: sodium chloride  Assessment/Plan: Jeff Russell is a 45 y.o. male ith RA and prior hx of MRSA infection in "leg" now with necrotizing MSSA soft tissue in bursa sp I and D  1) Right knee MSSA bursistis: --continue IV ancef for 3 weeks post surgery on Friday --home care will need to check weekly cbc and bmp while pt is on ancef   2)Screening: HIV negative, hep panel pending   Patient has hospital follow-up with me on 04/17/2011 at 2pm  and has my card and I put this information into the discharge manager.  I will sign off for now. Please call with further questions.    LOS: 3 days   Acey Lav 03/27/2011, 6:44 PM

## 2011-03-28 ENCOUNTER — Encounter (HOSPITAL_COMMUNITY): Payer: Self-pay | Admitting: Anesthesiology

## 2011-03-28 ENCOUNTER — Inpatient Hospital Stay (HOSPITAL_COMMUNITY): Payer: BC Managed Care – PPO | Admitting: Anesthesiology

## 2011-03-28 ENCOUNTER — Encounter (HOSPITAL_COMMUNITY)
Admission: AD | Disposition: A | Discharge status: Home Health | Payer: Self-pay | Source: Ambulatory Visit | Attending: Orthopedic Surgery

## 2011-03-28 HISTORY — PX: I&D EXTREMITY: SHX5045

## 2011-03-28 SURGERY — IRRIGATION AND DEBRIDEMENT EXTREMITY
Anesthesia: General | Site: Knee | Laterality: Right | Wound class: Clean

## 2011-03-28 MED ORDER — MIDAZOLAM HCL 5 MG/5ML IJ SOLN
INTRAMUSCULAR | Status: DC | PRN
  Administered 2011-03-28: 2 mg via INTRAVENOUS

## 2011-03-28 MED ORDER — ONDANSETRON HCL 4 MG/2ML IJ SOLN: 4.0000 mg | Freq: Four times a day (QID) | INTRAMUSCULAR | Status: DC | PRN

## 2011-03-28 MED ORDER — LIDOCAINE HCL (CARDIAC) 10 MG/ML IV SOLN
INTRAVENOUS | Status: DC | PRN
  Administered 2011-03-28: 60 mg via INTRAVENOUS

## 2011-03-28 MED ORDER — HYDROMORPHONE HCL PF 1 MG/ML IJ SOLN
0.2500 mg | INTRAMUSCULAR | Status: DC | PRN
  Administered 2011-03-28 (×4): 0.5 mg via INTRAVENOUS

## 2011-03-28 MED ORDER — FENTANYL CITRATE 0.05 MG/ML IJ SOLN
INTRAMUSCULAR | Status: DC | PRN
  Administered 2011-03-28: 50 ug via INTRAVENOUS
  Administered 2011-03-28 (×2): 100 ug via INTRAVENOUS
  Administered 2011-03-28: 50 ug via INTRAVENOUS
  Administered 2011-03-28: 100 ug via INTRAVENOUS

## 2011-03-28 MED ORDER — HYDROMORPHONE HCL PF 1 MG/ML IJ SOLN
INTRAMUSCULAR | Status: AC
  Filled 2011-03-28: qty 1

## 2011-03-28 MED ORDER — BUPIVACAINE HCL 0.25 % IJ SOLN
INTRAMUSCULAR | Status: DC | PRN
  Administered 2011-03-28: 10 mL

## 2011-03-28 MED ORDER — SODIUM CHLORIDE 0.9 % IR SOLN
Status: DC | PRN
  Administered 2011-03-28: 1000 mL

## 2011-03-28 MED ORDER — LACTATED RINGERS IV SOLN
INTRAVENOUS | Status: DC | PRN
  Administered 2011-03-28: 11:00:00 via INTRAVENOUS

## 2011-03-28 MED ORDER — PROPOFOL 10 MG/ML IV EMUL
INTRAVENOUS | Status: DC | PRN
  Administered 2011-03-28: 200 mg via INTRAVENOUS

## 2011-03-28 MED ORDER — ONDANSETRON HCL 4 MG/2ML IJ SOLN
INTRAMUSCULAR | Status: DC | PRN
  Administered 2011-03-28: 4 mg via INTRAVENOUS

## 2011-03-28 MED ORDER — OXYCODONE HCL 5 MG PO TABS: 5.0000 mg | ORAL_TABLET | ORAL | Status: AC | PRN

## 2011-03-28 SURGICAL SUPPLY — 46 items
BANDAGE ACE 4 STERILE (GAUZE/BANDAGES/DRESSINGS) ×2 IMPLANT
BANDAGE ELASTIC 4 VELCRO ST LF (GAUZE/BANDAGES/DRESSINGS) IMPLANT
BANDAGE ELASTIC 6 VELCRO ST LF (GAUZE/BANDAGES/DRESSINGS) ×2 IMPLANT
BANDAGE ESMARK 6X9 LF (GAUZE/BANDAGES/DRESSINGS) IMPLANT
BANDAGE GAUZE ELAST BULKY 4 IN (GAUZE/BANDAGES/DRESSINGS) ×2 IMPLANT
BLADE SURG 10 STRL SS (BLADE) ×2 IMPLANT
BNDG COHESIVE 4X5 TAN STRL (GAUZE/BANDAGES/DRESSINGS) ×2 IMPLANT
BNDG ESMARK 6X9 LF (GAUZE/BANDAGES/DRESSINGS)
CLOTH BEACON ORANGE TIMEOUT ST (SAFETY) ×2 IMPLANT
CUFF TOURNIQUET SINGLE 18IN (TOURNIQUET CUFF) IMPLANT
CUFF TOURNIQUET SINGLE 24IN (TOURNIQUET CUFF) IMPLANT
CUFF TOURNIQUET SINGLE 34IN LL (TOURNIQUET CUFF) IMPLANT
CUFF TOURNIQUET SINGLE 44IN (TOURNIQUET CUFF) IMPLANT
DRSG ADAPTIC 3X8 NADH LF (GAUZE/BANDAGES/DRESSINGS) ×2 IMPLANT
DRSG EMULSION OIL 3X3 NADH (GAUZE/BANDAGES/DRESSINGS) IMPLANT
DRSG PAD ABDOMINAL 8X10 ST (GAUZE/BANDAGES/DRESSINGS) ×2 IMPLANT
ELECT REM PT RETURN 9FT ADLT (ELECTROSURGICAL)
ELECTRODE REM PT RTRN 9FT ADLT (ELECTROSURGICAL) IMPLANT
GLOVE BIOGEL PI IND STRL 8 (GLOVE) ×1 IMPLANT
GLOVE BIOGEL PI INDICATOR 8 (GLOVE) ×1
GLOVE ORTHO TXT STRL SZ7.5 (GLOVE) ×2 IMPLANT
GLOVE SURG ORTHO 8.0 STRL STRW (GLOVE) ×2 IMPLANT
GOWN PREVENTION PLUS XLARGE (GOWN DISPOSABLE) ×2 IMPLANT
GOWN STRL NON-REIN LRG LVL3 (GOWN DISPOSABLE) ×2 IMPLANT
HANDPIECE INTERPULSE COAX TIP (DISPOSABLE)
KIT BASIN OR (CUSTOM PROCEDURE TRAY) ×2 IMPLANT
KIT ROOM TURNOVER OR (KITS) ×2 IMPLANT
MANIFOLD NEPTUNE II (INSTRUMENTS) ×2 IMPLANT
NS IRRIG 1000ML POUR BTL (IV SOLUTION) ×2 IMPLANT
PACK ORTHO EXTREMITY (CUSTOM PROCEDURE TRAY) ×2 IMPLANT
PAD ARMBOARD 7.5X6 YLW CONV (MISCELLANEOUS) ×2 IMPLANT
PADDING CAST COTTON 6X4 STRL (CAST SUPPLIES) ×2 IMPLANT
SET HNDPC FAN SPRY TIP SCT (DISPOSABLE) IMPLANT
SPONGE GAUZE 4X4 12PLY (GAUZE/BANDAGES/DRESSINGS) ×2 IMPLANT
SPONGE LAP 18X18 X RAY DECT (DISPOSABLE) ×2 IMPLANT
SPONGE LAP 4X18 X RAY DECT (DISPOSABLE) IMPLANT
STOCKINETTE IMPERVIOUS 9X36 MD (GAUZE/BANDAGES/DRESSINGS) ×2 IMPLANT
SUT ETHILON 2 0 FS 18 (SUTURE) ×2 IMPLANT
SUT PROLENE 1 CT (SUTURE) ×4 IMPLANT
TOWEL OR 17X24 6PK STRL BLUE (TOWEL DISPOSABLE) ×2 IMPLANT
TOWEL OR 17X26 10 PK STRL BLUE (TOWEL DISPOSABLE) ×2 IMPLANT
TUBE ANAEROBIC SPECIMEN COL (MISCELLANEOUS) IMPLANT
TUBE CONNECTING 12X1/4 (SUCTIONS) ×2 IMPLANT
UNDERPAD 30X30 INCONTINENT (UNDERPADS AND DIAPERS) ×2 IMPLANT
WATER STERILE IRR 1000ML POUR (IV SOLUTION) IMPLANT
YANKAUER SUCT BULB TIP NO VENT (SUCTIONS) ×2 IMPLANT

## 2011-03-28 NOTE — Op Note (Signed)
Dictated

## 2011-03-28 NOTE — Anesthesia Postprocedure Evaluation (Signed)
Anesthesia Post Note  Patient: Jeff Russell  Procedure(s) Performed:  IRRIGATION AND DEBRIDEMENT EXTREMITY - I&D right knee with wound closure  Anesthesia type: General  Patient location: PACU  Post pain: Pain level controlled and Adequate analgesia  Post assessment: Post-op Vital signs reviewed, Patient's Cardiovascular Status Stable, Respiratory Function Stable, Patent Airway and Pain level controlled  Last Vitals:  Filed Vitals:   03/28/11 1233  BP:   Pulse: 91  Temp:   Resp: 18    Post vital signs: Reviewed and stable  Level of consciousness: awake, alert  and oriented  Complications: No apparent anesthesia complications

## 2011-03-28 NOTE — Transfer of Care (Signed)
Immediate Anesthesia Transfer of Care Note  Patient: Jeff Russell  Procedure(s) Performed:  IRRIGATION AND DEBRIDEMENT EXTREMITY - I&D right knee with wound closure  Patient Location: PACU  Anesthesia Type: General  Level of Consciousness: awake, alert , oriented and patient cooperative  Airway & Oxygen Therapy: Patient Spontanous Breathing and Patient connected to face mask oxygen  Post-op Assessment: Report given to PACU RN, Post -op Vital signs reviewed and stable and Patient moving all extremities X 4  Post vital signs: Reviewed and stable  Complications: No apparent anesthesia complications

## 2011-03-28 NOTE — Anesthesia Procedure Notes (Signed)
Procedure Name: LMA Insertion Date/Time: 03/28/2011 11:10 AM Performed by: Leona Singleton A. Oxygen Delivery Method: Circle System Utilized Preoxygenation: Pre-oxygenation with 100% oxygen Intubation Type: IV induction LMA: LMA inserted LMA Size: 5.0 Tube type: Oral Number of attempts: 1 Placement Confirmation: positive ETCO2 and breath sounds checked- equal and bilateral Tube secured with: Tape Dental Injury: Teeth and Oropharynx as per pre-operative assessment

## 2011-03-28 NOTE — Anesthesia Preprocedure Evaluation (Addendum)
Anesthesia Evaluation  Patient identified by MRN, date of birth, ID band Patient awake    Reviewed: Allergy & Precautions, H&P , NPO status , Patient's Chart, lab work & pertinent test results  Airway Mallampati: II  Neck ROM: full    Dental  (+) Teeth Intact and Dental Advisory Given   Pulmonary shortness of breath, pneumonia , Current Smoker,  clear to auscultation        Cardiovascular Exercise Tolerance: Good neg cardio ROS Regular Normal    Neuro/Psych Negative Neurological ROS     GI/Hepatic negative GI ROS, Neg liver ROS,   Endo/Other  Negative Endocrine ROS  Renal/GU negative Renal ROS  Genitourinary negative   Musculoskeletal  (+) Arthritis -, Rheumatoid disorders,    Abdominal (+) obese,   Peds  Hematology negative hematology ROS (+)   Anesthesia Other Findings   Reproductive/Obstetrics negative OB ROS                          Anesthesia Physical Anesthesia Plan  ASA: II  Anesthesia Plan: General   Post-op Pain Management:    Induction: Intravenous  Airway Management Planned: LMA  Additional Equipment:   Intra-op Plan:   Post-operative Plan:   Informed Consent: I have reviewed the patients History and Physical, chart, labs and discussed the procedure including the risks, benefits and alternatives for the proposed anesthesia with the patient or authorized representative who has indicated his/her understanding and acceptance.     Plan Discussed with: CRNA and Surgeon  Anesthesia Plan Comments:         Anesthesia Quick Evaluation

## 2011-03-28 NOTE — Progress Notes (Addendum)
CARE MANAGEMENT NOTE 03/28/2011 Discharge planning. Patient will need IV Ancef 2 grams every 8 hrs.  until Jan.11, 2013.Will need CBC & BMP results faxed to Dr. Algis Liming [phone].

## 2011-03-28 NOTE — Preoperative (Signed)
Beta Blockers   Reason not to administer Beta Blockers:Not Applicable 

## 2011-03-28 NOTE — Interval H&P Note (Signed)
History and Physical Interval Note:  03/28/2011 10:03 AM  Jeff Russell  has presented today for surgery, with the diagnosis of infection right knee  The various methods of treatment have been discussed with the patient and family. After consideration of risks, benefits and other options for treatment, the patient has consented to  Procedure(s): IRRIGATION AND DEBRIDEMENT EXTREMITY as a surgical intervention .  The patients' history has been reviewed, patient examined, no change in status, stable for surgery.  I have reviewed the patients' chart and labs.  Questions were answered to the patient's satisfaction.     Kirke Breach JR,W D

## 2011-03-28 NOTE — Progress Notes (Signed)
ANTIBIOTIC CONSULT NOTE - FOLLOW UP  Pharmacy Consult for: Ancef Indication: R knee MSSA bursitis  No Known Allergies  Vital Signs: Temp: 97.6 F (36.4 C) (12/21 0515) Temp src: Oral (12/21 0515) BP: 126/80 mmHg (12/21 0515) Pulse Rate: 83  (12/21 0515) Intake/Output from previous day: 12/20 0701 - 12/21 0700 In: 600 [I.V.:600] Out: 850 [Urine:850]  Labs:  [facility] 03/27/11 1631 03/27/11 0615 03/26/11 0522  WBC 7.4 -- --  HGB 12.8* -- --  PLT 229 -- --  LABCREA -- -- --  CREATININE 0.84 0.82 0.85   Estimated Creatinine Clearance: 148.1 ml/min (by C-G formula based on Cr of 0.84).  Microbiology: Recent Results (from the past 720 hour(s))  SURGICAL PCR SCREEN     Status: Normal   Collection Time   03/24/11 10:48 AM      Component Value Range Status Comment   MRSA, PCR NEGATIVE  NEGATIVE  Final    Staphylococcus aureus NEGATIVE  NEGATIVE  Final   URINE CULTURE     Status: Normal   Collection Time   03/24/11  4:01 PM      Component Value Range Status Comment   Specimen Description URINE, CLEAN CATCH   Final    Special Requests Immunocompromised   Final    Setup Time [id number]   Final    Colony Count NO GROWTH   Final    Culture NO GROWTH   Final    Report Status 03/25/2011 FINAL   Final   CULTURE, BLOOD (ROUTINE X 2)     Status: Normal (Preliminary result)   Collection Time   03/24/11  5:30 PM      Component Value Range Status Comment   Specimen Description BLOOD ARM RIGHT   Final    Special Requests BOTTLES DRAWN AEROBIC AND ANAEROBIC 10CC   Final    Setup Time [id number]   Final    Culture     Final    Value:        BLOOD CULTURE RECEIVED NO GROWTH TO DATE CULTURE WILL BE HELD FOR 5 DAYS BEFORE ISSUING A FINAL NEGATIVE REPORT   Report Status PENDING   Incomplete   CULTURE, BLOOD (ROUTINE X 2)     Status: Normal (Preliminary result)   Collection Time   03/24/11  6:25 PM      Component Value Range Status Comment   Specimen Description BLOOD RIGHT ARM    Final    Special Requests BOTTLES DRAWN AEROBIC AND ANAEROBIC 10CC   Final    Setup Time [id number]   Final    Culture     Final    Value:        BLOOD CULTURE RECEIVED NO GROWTH TO DATE CULTURE WILL BE HELD FOR 5 DAYS BEFORE ISSUING A FINAL NEGATIVE REPORT   Report Status PENDING   Incomplete     Anti-infectives     Start     Dose/Rate Route Frequency Ordered Stop   03/25/11 1400   ceFAZolin (ANCEF) 2,000 mg in dextrose 5 % 50 mL IVPB        2,000 mg 140 mL/hr over 30 Minutes Intravenous 3 times per day 03/25/11 1245     03/25/11 1030   hydroxychloroquine (PLAQUENIL) tablet 200 mg        200 mg Oral 2 times daily 03/25/11 0940     03/25/11 0530   vancomycin (VANCOCIN) IVPB 1000 mg/200 mL premix  Status:  Discontinued        1,000  mg 200 mL/hr over 60 Minutes Intravenous Every 12 hours 03/24/11 2050 03/25/11 1144         Assessment: 45yom continues on ancef day #4 for right knee MSSA bursitis s/p I&D 12/17 and back to OR today for repeat I&D. Renal function has remained stable. Ancef dose appropriate. Noted plan to continue ancef for 3 weeks upon d/c today.  Goal of Therapy:  Resolution of infection  Plan:  1) Continue ancef 2g IV q8  Fredrik Rigger 03/28/2011,8:58 AM

## 2011-03-29 NOTE — Op Note (Signed)
NAME:  Jeff Russell, Jeff Russell               ACCOUNT NO.:  [id number]  MEDICAL RECORD NO.:  [id number]  LOCATION:  5006                         FACILITY:  [facility]  PHYSICIAN:  Dyke Brackett, M.D.    DATE OF BIRTH:  02-Oct-1965  DATE OF PROCEDURE:  03/28/2011 DATE OF DISCHARGE:  03/28/2011                              OPERATIVE REPORT   INDICATIONS:  A 45 year old, severe septic infection relative to bacterial Staph infection of his knee status post I and D, VAC application thought to be amenable to repeat I and D and possible closure.  PREOPERATIVE DIAGNOSIS:  Infected right knee.  POSTOPERATIVE DIAGNOSIS:  Infected right knee.  OPERATION: 1. Irrigation and debridement of right knee. 2. Closure.  SURGEON:  Dyke Brackett, MD  DESCRIPTION OF PROCEDURE:  Sterile prep.  We did not use tourniquet.  We lavaged the knee with approximately 1500 mL saline.  There was a really significant amount of healthy granulation tissue.  He had some skin necrosis in the very center of the wound where it created somewhat of a stellate wound due to skin necrosis.  We used some very heavy Prolene #1, got to the central area of the wound and then through the combination of using smaller nylon, we basically closed the wound or I would say that the necrotic area that was transversely oriented was somewhat tenuous when we closed it although this was to me not a large enough area to leave open.  We then applied a bulky dressing with a lighter sterile dressing with the knee in full extension, and an immobilizer.  Followup plans include that we discharge the patient.  IV antibiotics per Infectious Disease.  I will probably change his dressing myself Monday in the office and he will probably be discharged home today.     Dyke Brackett, M.D.     WDC/MEDQ  D:  03/28/2011  T:  03/29/2011  Job:  [id number]

## 2011-03-31 LAB — CULTURE, BLOOD (ROUTINE X 2)
Culture  Setup Time: 201212180145
Culture: NO GROWTH

## 2011-04-03 ENCOUNTER — Encounter (HOSPITAL_COMMUNITY): Payer: Self-pay | Admitting: Orthopedic Surgery

## 2011-04-17 ENCOUNTER — Inpatient Hospital Stay: Payer: BC Managed Care – PPO | Admitting: Infectious Disease

## 2011-04-17 ENCOUNTER — Telehealth: Payer: Self-pay | Admitting: Licensed Clinical Social Worker

## 2011-04-17 NOTE — Telephone Encounter (Signed)
Patient's antibiotics will end tomorrow via his picc line and he would like to know if the home health nurse could pull his picc tomorrow. He had an appointment today but it was rescheduled by our office. Per Dr. Daiva Eves ok to have picc pulled and stop antibiotics.

## 2011-04-23 ENCOUNTER — Inpatient Hospital Stay: Payer: BC Managed Care – PPO | Admitting: Infectious Disease

## 2011-04-23 ENCOUNTER — Telehealth: Payer: Self-pay | Admitting: *Deleted

## 2011-04-23 NOTE — Telephone Encounter (Signed)
Called patient to reschedule his missed appointment. Patient said he thought his appt was at 2pm today. Advised him that the person who schedules hospital F/U is not in until this afternoon and I will have her call him back with available appointments.

## 2011-04-29 ENCOUNTER — Encounter: Payer: Self-pay | Admitting: Infectious Disease

## 2011-05-01 ENCOUNTER — Ambulatory Visit (INDEPENDENT_AMBULATORY_CARE_PROVIDER_SITE_OTHER): Payer: BC Managed Care – PPO | Admitting: Infectious Disease

## 2011-05-01 ENCOUNTER — Encounter: Payer: Self-pay | Admitting: Infectious Disease

## 2011-05-01 VITALS — BP 103/70 | HR 80 | Temp 97.8°F | Ht 76.0 in | Wt 253.8 lb

## 2011-05-01 DIAGNOSIS — A4901 Methicillin susceptible Staphylococcus aureus infection, unspecified site: Secondary | ICD-10-CM

## 2011-05-01 DIAGNOSIS — M7110 Other infective bursitis, unspecified site: Secondary | ICD-10-CM

## 2011-05-01 DIAGNOSIS — M7150 Other bursitis, not elsewhere classified, unspecified site: Secondary | ICD-10-CM

## 2011-05-01 DIAGNOSIS — T148XXA Other injury of unspecified body region, initial encounter: Secondary | ICD-10-CM

## 2011-05-01 DIAGNOSIS — M069 Rheumatoid arthritis, unspecified: Secondary | ICD-10-CM

## 2011-05-01 DIAGNOSIS — T07XXXA Unspecified multiple injuries, initial encounter: Secondary | ICD-10-CM

## 2011-05-01 HISTORY — DX: Methicillin susceptible Staphylococcus aureus infection, unspecified site: A49.01

## 2011-05-01 HISTORY — DX: Other infective bursitis, unspecified site: M71.10

## 2011-05-01 MED ORDER — CHLORHEXIDINE GLUCONATE 4 % EX LIQD: 60.0000 mL | Freq: Every day | CUTANEOUS | Status: AC

## 2011-05-01 MED ORDER — MUPIROCIN 2 % EX OINT: TOPICAL_OINTMENT | Freq: Two times a day (BID) | CUTANEOUS | Status: AC

## 2011-05-01 NOTE — Progress Notes (Signed)
  Subjective:    Patient ID: Jeff Russell, male    DOB: September 10, 1965, 46 y.o.   MRN: [id number]  HPI  Jeff Russell is a 46 y.o. male ith RA and prior hx of MRSA infection in "leg" now with necrotizing MSSA soft tissue in bursa sp I and D and nearly a month of IV ancef that was stopped 10 days ago. Knee swellign is much improved. HE has no fevers, chills, or malaise. He had a furuncle show up on his right thigh without drainage. He continues to have problems with scars on hands and erythema surrounding this which a Dermatologist Dr.Lupton has worked up and tried to treat. WE discussed trying a decolonization regimen of hibiclens and intranasal mupirocin for both the pt and his wife.   Review of Systems  Constitutional: Negative for fever, chills, diaphoresis, activity change, appetite change, fatigue and unexpected weight change.  HENT: Negative for congestion, sore throat, rhinorrhea, sneezing, trouble swallowing and sinus pressure.   Eyes: Negative for photophobia and visual disturbance.  Respiratory: Negative for cough, chest tightness, shortness of breath, wheezing and stridor.   Cardiovascular: Negative for chest pain, palpitations and leg swelling.  Gastrointestinal: Negative for nausea, vomiting, abdominal pain, diarrhea, constipation, blood in stool, abdominal distention and anal bleeding.  Genitourinary: Negative for dysuria, hematuria, flank pain and difficulty urinating.  Musculoskeletal: Positive for arthralgias. Negative for myalgias, back pain, joint swelling and gait problem.  Skin: Positive for rash and wound. Negative for color change and pallor.  Neurological: Negative for dizziness, tremors, weakness and light-headedness.  Hematological: Negative for adenopathy. Does not bruise/bleed easily.  Psychiatric/Behavioral: Negative for behavioral problems, confusion, sleep disturbance, dysphoric mood, decreased concentration and agitation.       Objective:   Physical Exam    Constitutional: He is oriented to person, place, and time. He appears well-developed and well-nourished. No distress.  HENT:  Head: Normocephalic and atraumatic.  Mouth/Throat: Oropharynx is clear and moist. No oropharyngeal exudate.  Eyes: Conjunctivae and EOM are normal. Pupils are equal, round, and reactive to light. No scleral icterus.  Neck: Normal range of motion. Neck supple. No JVD present.  Cardiovascular: Normal rate, regular rhythm and normal heart sounds.  Exam reveals no gallop and no friction rub.   No murmur heard. Pulmonary/Chest: Effort normal and breath sounds normal. No respiratory distress. He has no wheezes. He has no rales. He exhibits no tenderness.  Abdominal: He exhibits no distension and no mass. There is no tenderness. There is no rebound and no guarding.  Musculoskeletal: He exhibits no edema and no tenderness.  Lymphadenopathy:    He has no cervical adenopathy.  Neurological: He is alert and oriented to person, place, and time. He has normal reflexes. He exhibits normal muscle tone. Coordination normal.  Skin: Skin is warm and dry. He is not diaphoretic. No erythema. No pallor.     Psychiatric: He has a normal mood and affect. His behavior is normal. Judgment and thought content normal.          Assessment & Plan:  Septic bursitis Sp 4 weeks of therapy post I and D. SHould have cleared.   MSSA (methicillin susceptible Staphylococcus aureus) infection Try to decolononize with hibiclens and intranasal mupirocin along with his wife  Rheumatoid arthritis Currently not being aggressively treated. ESR may be high from this rather than infection  Excoriation ? Trauma fro his work causing this. >? Erythema that pops up around this> Defer to Dr. Terri Piedra

## 2011-05-01 NOTE — Assessment & Plan Note (Signed)
?   Trauma fro his work causing this. >? Erythema that pops up around this> Defer to Dr. Terri Piedra

## 2011-05-01 NOTE — Assessment & Plan Note (Signed)
Sp 4 weeks of therapy post I and D. SHould have cleared.

## 2011-05-01 NOTE — Assessment & Plan Note (Signed)
Currently not being aggressively treated. ESR may be high from this rather than infection

## 2011-05-01 NOTE — Assessment & Plan Note (Signed)
Try to decolononize with hibiclens and intranasal mupirocin along with his wife

## 2011-10-02 ENCOUNTER — Ambulatory Visit: Payer: BC Managed Care – PPO | Admitting: Infectious Disease

## 2011-11-05 ENCOUNTER — Encounter (HOSPITAL_COMMUNITY): Payer: Self-pay

## 2011-11-05 ENCOUNTER — Emergency Department (HOSPITAL_COMMUNITY)
Admission: EM | Admit: 2011-11-05 | Discharge: 2011-11-06 | Disposition: A | Discharge status: Home / Self Care | Payer: BC Managed Care – PPO | Attending: Emergency Medicine | Admitting: Emergency Medicine

## 2011-11-05 DIAGNOSIS — Y998 Other external cause status: Secondary | ICD-10-CM | POA: Insufficient documentation

## 2011-11-05 DIAGNOSIS — R21 Rash and other nonspecific skin eruption: Secondary | ICD-10-CM | POA: Insufficient documentation

## 2011-11-05 DIAGNOSIS — S0180XA Unspecified open wound of other part of head, initial encounter: Secondary | ICD-10-CM | POA: Insufficient documentation

## 2011-11-05 DIAGNOSIS — Y9389 Activity, other specified: Secondary | ICD-10-CM | POA: Insufficient documentation

## 2011-11-05 DIAGNOSIS — S0181XA Laceration without foreign body of other part of head, initial encounter: Secondary | ICD-10-CM

## 2011-11-05 DIAGNOSIS — M129 Arthropathy, unspecified: Secondary | ICD-10-CM | POA: Insufficient documentation

## 2011-11-05 DIAGNOSIS — F17200 Nicotine dependence, unspecified, uncomplicated: Secondary | ICD-10-CM | POA: Insufficient documentation

## 2011-11-05 DIAGNOSIS — W208XXA Other cause of strike by thrown, projected or falling object, initial encounter: Secondary | ICD-10-CM | POA: Insufficient documentation

## 2011-11-05 NOTE — ED Notes (Signed)
Pt states he was working on his garage door and the spring broke free and hit him in the chin causing a laceration-happened at 1930-unknown last DT

## 2011-11-06 MED ORDER — TETANUS-DIPHTH-ACELL PERTUSSIS 5-2.5-18.5 LF-MCG/0.5 IM SUSP
0.5000 mL | Freq: Once | INTRAMUSCULAR | Status: AC
  Administered 2011-11-06: 0.5 mL via INTRAMUSCULAR
  Filled 2011-11-06: qty 0.5

## 2011-11-06 MED ORDER — PERMETHRIN 5 % EX CREA: TOPICAL_CREAM | CUTANEOUS | Status: AC

## 2011-11-06 NOTE — ED Provider Notes (Signed)
Medical screening examination/treatment/procedure(s) were performed by non-physician practitioner and as supervising physician I was immediately available for consultation/collaboration.   Lyanne Co, MD 11/06/11 [phone]

## 2011-11-06 NOTE — ED Provider Notes (Signed)
History     CSN: [id number]  Arrival date & time 11/05/11  2059   First MD Initiated Contact with Patient 11/05/11 2323      Chief Complaint  Patient presents with  . Facial Laceration    (Consider location/radiation/quality/duration/timing/severity/associated sxs/prior treatment) Patient is a 46 y.o. male presenting with skin laceration. The history is provided by the patient.  Laceration  The incident occurred 3 to 5 hours ago. Pain location: chin. The laceration mechanism was a a blunt object. The pain is at a severity of 3/10. The pain is mild. His tetanus status is out of date.  Pt states he was messing around with a garage door when it fell and hit him on a chin. Pt reports laceration to the chin. Bleeding. No headache, no LOC, no other injuries  Past Medical History  Diagnosis Date  . Shortness of breath   . Pneumonia   . Arthritis     Past Surgical History  Procedure Date  . Cholecystectomy   . Carpel tunnel   . I&d extremity 03/24/2011    Procedure: IRRIGATION AND DEBRIDEMENT EXTREMITY;  Surgeon: Thera Flake., MD;  Location: [facility] OR;  Service: Orthopedics;  Laterality: Right;  Irrigation and debridement right knee abcess and Bursa,  application of wound vac  . I&d extremity 03/28/2011    Procedure: IRRIGATION AND DEBRIDEMENT EXTREMITY;  Surgeon: Thera Flake., MD;  Location: [facility] OR;  Service: Orthopedics;  Laterality: Right;  I&D right knee with wound closure    No family history on file.  History  Substance Use Topics  . Smoking status: Current Everyday Smoker -- 1.5 packs/day for 30 years    Types: Cigarettes  . Smokeless tobacco: Never Used  . Alcohol Use: No      Review of Systems  Constitutional: Negative for fever and chills.  Respiratory: Negative.   Cardiovascular: Negative.   Skin: Positive for wound.  Neurological: Negative for dizziness, weakness and headaches.    Allergies  Review of patient's allergies indicates no known  allergies.  Home Medications   Current Outpatient Rx  Name Route Sig Dispense Refill  . ASPIRIN 81 MG PO TABS Oral Take 81 mg by mouth daily.    Marland Kitchen DICLOFENAC-MISOPROSTOL 75-200 MG-MCG PO TABS Oral Take 1 tablet by mouth 2 (two) times daily.    Marland Kitchen FOLIC ACID 1 MG PO TABS Oral Take 1 mg by mouth 2 (two) times daily.      Marland Kitchen HYDROCODONE-ACETAMINOPHEN 5-500 MG PO TABS Oral Take 1 tablet by mouth at bedtime.    Marland Kitchen HYDROXYCHLOROQUINE SULFATE 200 MG PO TABS Oral Take 200 mg by mouth 2 (two) times daily.    Marland Kitchen OMEPRAZOLE-SODIUM BICARBONATE 20-1100 MG PO CAPS Oral Take 1 capsule by mouth daily before breakfast.    . SULFASALAZINE 500 MG PO TBEC Oral Take 500 mg by mouth 2 (two) times daily.    . TRAMADOL HCL 50 MG PO TABS Oral Take 50 mg by mouth every 6 (six) hours as needed.      BP 149/91  Pulse 77  Temp 97.8 F (36.6 C) (Oral)  Resp 20  Ht 6\' 3"  (1.905 m)  Wt 230 lb (104.327 kg)  BMI 28.75 kg/m2  SpO2 99%  Physical Exam  Nursing note and vitals reviewed. Constitutional: He is oriented to person, place, and time. He appears well-developed and well-nourished. No distress.  HENT:  Head: Normocephalic.       3cm gaping laceration to the right chin.  hemostatic  Eyes: Conjunctivae and EOM are normal. Pupils are equal, round, and reactive to light.  Neck: Neck supple.  Cardiovascular: Normal rate, regular rhythm and normal heart sounds.   Pulmonary/Chest: Effort normal and breath sounds normal. No respiratory distress. He has no wheezes. He has no rales.  Neurological: He is alert and oriented to person, place, and time.  Skin: Skin is warm and dry.  Psychiatric: He has a normal mood and affect.    ED Course  Procedures (including critical care time)  Laceration to the chin. No other injuries. No HA, n/v. No neurodeficits.   LACERATION REPAIR Performed by: Lottie Mussel Authorized by: Jaynie Crumble A Consent: Verbal consent obtained. Risks and benefits: risks, benefits  and alternatives were discussed Consent given by: patient Patient identity confirmed: provided demographic data Prepped and Draped in normal sterile fashion Wound explored  Laceration Location: right chin  Laceration Length: 3cm  No Foreign Bodies seen or palpated  Anesthesia: local infiltration  Local anesthetic: lidocaine 2% w epinephrine  Anesthetic total: 3 ml  Irrigation method: syringe Amount of cleaning: standard  Skin closure: prolene 6.0  Number of sutures: 6  Technique: simple interrupted.   Patient tolerance: Patient tolerated the procedure well with no immediate complications.  12:06 AM Laceration repaired. Will d/c home with follow up for suture repair. Tetanus updated.    1. Laceration of chin   2. Rash       MDM          Lottie Mussel, PA 11/06/11 0134

## 2013-05-29 IMAGING — CR DG CHEST 1V PORT
1 series · 1 of 1 positions shown · non-contrast
Comparison: Chest x-ray of 01/14/2011

CLINICAL DATA: Preop, cough, short of breath, smoking history

PORTABLE CHEST - 1 VIEW

[view not recorded]
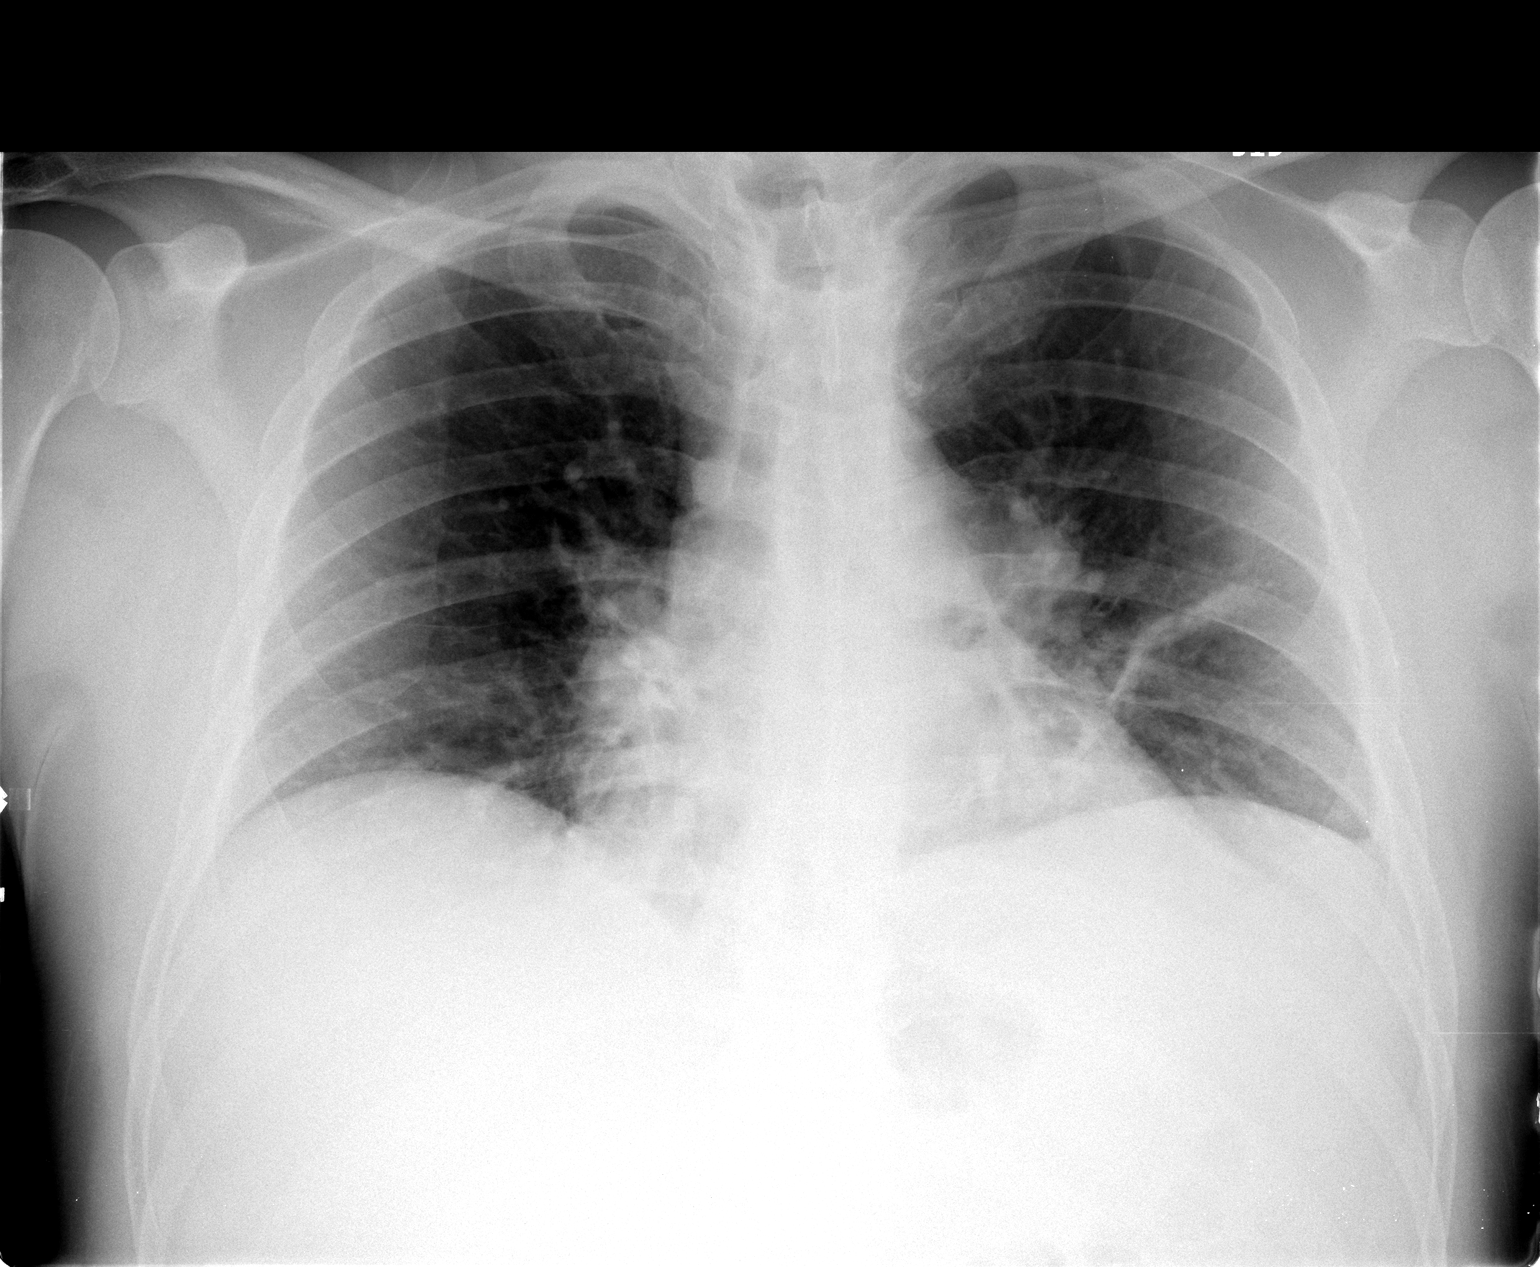

[1 of 1 positions shown; findings below may reference images not displayed]

FINDINGS: Basilar linear atelectasis and scarring is present.  The
lungs are not optimally aerated.  No focal infiltrate or effusion
is seen.  The heart is mildly enlarged and stable.  No bony
abnormality is seen.
IMPRESSION: Suboptimal inspiration with bibasilar atelectasis and/or scarring.
No definite active process.

## 2014-05-16 ENCOUNTER — Encounter: Payer: Self-pay | Admitting: Family Medicine

## 2014-05-16 ENCOUNTER — Ambulatory Visit (INDEPENDENT_AMBULATORY_CARE_PROVIDER_SITE_OTHER): Payer: BLUE CROSS/BLUE SHIELD | Admitting: Family Medicine

## 2014-05-16 VITALS — BP 100/64 | HR 77 | Temp 97.9°F | Ht 72.5 in | Wt 250.9 lb

## 2014-05-16 DIAGNOSIS — F17210 Nicotine dependence, cigarettes, uncomplicated: Secondary | ICD-10-CM | POA: Insufficient documentation

## 2014-05-16 DIAGNOSIS — Z9109 Other allergy status, other than to drugs and biological substances: Secondary | ICD-10-CM | POA: Insufficient documentation

## 2014-05-16 DIAGNOSIS — Z720 Tobacco use: Secondary | ICD-10-CM

## 2014-05-16 DIAGNOSIS — Z7689 Persons encountering health services in other specified circumstances: Secondary | ICD-10-CM

## 2014-05-16 DIAGNOSIS — Z7189 Other specified counseling: Secondary | ICD-10-CM

## 2014-05-16 DIAGNOSIS — Z91048 Other nonmedicinal substance allergy status: Secondary | ICD-10-CM

## 2014-05-16 DIAGNOSIS — F17200 Nicotine dependence, unspecified, uncomplicated: Secondary | ICD-10-CM

## 2014-05-16 NOTE — Progress Notes (Signed)
Pre visit review using our clinic review tool, if applicable. No additional management support is needed unless otherwise documented below in the visit note. 

## 2014-05-16 NOTE — Patient Instructions (Signed)
BEFORE YOU LEAVE: -physical exam in 3-4 months - come fasting but drink plenty of water  Quit smoking  Follow up with your opthomologist  We recommend the following healthy lifestyle measures: - eat a healthy diet consisting of lots of vegetables, fruits, beans, nuts, seeds, healthy meats such as white chicken and fish and whole grains.  - avoid fried foods, fast food, processed foods, sodas, red meet and other fattening foods.  - get a least 150 minutes of aerobic exercise per week.

## 2014-05-16 NOTE — Progress Notes (Signed)
HPI:  Jeff Russell is here to establish care. Used to see Dr. Rex Kras - this is more convenient as wife comes here and did not like Dr. Rex Kras. Gets labs every few months with rheumatoloists. Sees dentist and opthomologist on a regular basis.  Has the following chronic problems that require follow up and concerns today:  Seasonal Allergies: -reports saw and allergist in the past and allergic to everything -symptoms nasal congestion, clear discharge both eye -has apptoinment with his opthomologist about dry eyes  Rheumatoid Arthritis: -managed by rheumatologist, [name] -on enbrel, sulfasalazine, gabapentin, robaxin, tramadol during the day and norvco at night -works in Teacher, adult education care  Tobacco use: -"I don't want to quit" -he understands risks -does not want to quit -used to do cocaine and MJ, but does not want to quit smoking -has some cough -denies: hemoptysis, weight loss  ROS negative for unless reported above: fevers, unintentional weight loss, hearing or vision loss, chest pain, palpitations, struggling to breath, hemoptysis, melena, hematochezia, hematuria, falls, loc, si, thoughts of self harm  Past Medical History  Diagnosis Date  . Shortness of breath   . Pneumonia   . Arthritis   . GERD (gastroesophageal reflux disease)   . History of MRSA infection     right leg per patient  . Septic bursitis 05/01/2011  . MSSA (methicillin susceptible Staphylococcus aureus) infection 05/01/2011    Past Surgical History  Procedure Laterality Date  . Cholecystectomy    . Carpel tunnel    . I&d extremity  03/24/2011    Procedure: IRRIGATION AND DEBRIDEMENT EXTREMITY;  Surgeon: Yvette Rack., MD;  Location: [place];  Service: Orthopedics;  Laterality: Right;  Irrigation and debridement right knee abcess and Bursa,  application of wound vac  . I&d extremity  03/28/2011    Procedure: IRRIGATION AND DEBRIDEMENT EXTREMITY;  Surgeon: Yvette Rack., MD;  Location: [facility];   Service: Orthopedics;  Laterality: Right;  I&D right knee with wound closure  . Tonsillectomy      Family History  Problem Relation Age of Onset  . Arthritis Maternal Grandmother   . Colon cancer Paternal Grandfather     deceased  . Prostate cancer Father   . Hypercholesterolemia Mother   . Heart disease Mother     palpitations, ?heart failure  . Hypertension Mother     History   Social History  . Marital Status: Married    Spouse Name: N/A    Number of Children: N/A  . Years of Education: N/A   Social History Main Topics  . Smoking status: Current Every Day Smoker -- 2.00 packs/day for 30 years    Types: Cigarettes  . Smokeless tobacco: Never Used  . Alcohol Use: No  . Drug Use: No  . Sexual Activity: Yes   Other Topics Concern  . None   Social History Narrative   Work or School: Art gallery manager Situation: lives with wife whom sees me as well       Spiritual Beliefs: believes in God; does not go to church      Lifestyle: no CV exercise - but physically active at work; diet is poor           Current outpatient prescriptions:  .  ENBREL SURECLICK 50 MG/ML injection, 50 mg once a week. , Disp: , Rfl: 1 .  Esomeprazole Magnesium (NEXIUM PO), Take 22.3 mg by mouth daily. , Disp: , Rfl:  .  gabapentin (NEURONTIN) 300 MG capsule, 2 (two) times daily. , Disp: , Rfl: 1 .  HYDROcodone-acetaminophen (NORCO/VICODIN) 5-325 MG per tablet, , Disp: , Rfl: 0 .  methocarbamol (ROBAXIN) 500 MG tablet, , Disp: , Rfl: 2 .  sulfaSALAzine (AZULFIDINE) 500 MG EC tablet, Take 500 mg by mouth 2 (two) times daily., Disp: , Rfl:  .  traMADol (ULTRAM) 50 MG tablet, Take 50 mg by mouth 3 (three) times daily as needed. , Disp: , Rfl:   EXAM:  Filed Vitals:   05/16/14 1113  BP: 100/64  Pulse: 77  Temp: 97.9 F (36.6 C)    Body mass index is 33.54 kg/(m^2).  GENERAL: vitals reviewed and listed above, alert, oriented, appears well hydrated and in no acute distress  HEENT:  atraumatic, conjunttiva clear, no obvious abnormalities on inspection of external nose and ears  NECK: no obvious masses on inspection  LUNGS: clear to auscultation bilaterally, no wheezes, rales or rhonchi, good air movement  CV: HRRR, no peripheral edema  MS: moves all extremities without noticeable abnormality  PSYCH: pleasant and cooperative, no obvious depression or anxiety  ASSESSMENT AND PLAN:  Discussed the following assessment and plan:  Multiple environmental allergies -consider antihistamine  Tobacco use disorder -advised to quit and counseled on risks, options to help - > 3<10 minutes  Encounter to establish care -We reviewed the PMH, PSH, FH, SH, Meds and Allergies. -We provided refills for any medications we will prescribe as needed. -We addressed current concerns per orders and patient instructions. -We have asked for records for pertinent exams, studies, vaccines and notes from previous providers. -We have advised patient to follow up per instructions below.   -Patient advised to return or notify a doctor immediately if symptoms worsen or persist or new concerns arise.  Patient Instructions  BEFORE YOU LEAVE: -physical exam in 3-4 months - come fasting but drink plenty of water  Quit smoking  Follow up with your opthomologist  We recommend the following healthy lifestyle measures: - eat a healthy diet consisting of lots of vegetables, fruits, beans, nuts, seeds, healthy meats such as white chicken and fish and whole grains.  - avoid fried foods, fast food, processed foods, sodas, red meet and other fattening foods.  - get a least 150 minutes of aerobic exercise per week.       Colin Benton R.

## 2014-05-17 ENCOUNTER — Telehealth: Payer: Self-pay | Admitting: Family Medicine

## 2014-05-17 NOTE — Telephone Encounter (Signed)
emmi mailed  °

## 2014-08-06 DIAGNOSIS — M0630 Rheumatoid nodule, unspecified site: Secondary | ICD-10-CM

## 2014-08-06 HISTORY — DX: Rheumatoid nodule, unspecified site: M06.30

## 2014-08-08 ENCOUNTER — Other Ambulatory Visit: Payer: Self-pay | Admitting: Rheumatology

## 2014-08-08 ENCOUNTER — Ambulatory Visit
Admission: RE | Admit: 2014-08-08 | Discharge: 2014-08-08 | Disposition: A | Discharge status: Home / Self Care | Payer: BLUE CROSS/BLUE SHIELD | Source: Ambulatory Visit | Attending: Rheumatology | Admitting: Rheumatology

## 2014-08-08 DIAGNOSIS — F17200 Nicotine dependence, unspecified, uncomplicated: Secondary | ICD-10-CM

## 2014-08-09 ENCOUNTER — Other Ambulatory Visit: Payer: Self-pay | Admitting: Rheumatology

## 2014-08-09 DIAGNOSIS — R911 Solitary pulmonary nodule: Secondary | ICD-10-CM

## 2014-08-14 ENCOUNTER — Ambulatory Visit
Admission: RE | Admit: 2014-08-14 | Discharge: 2014-08-14 | Disposition: A | Discharge status: Home / Self Care | Payer: BLUE CROSS/BLUE SHIELD | Source: Ambulatory Visit | Attending: Rheumatology | Admitting: Rheumatology

## 2014-08-14 DIAGNOSIS — R911 Solitary pulmonary nodule: Secondary | ICD-10-CM

## 2014-08-14 MED ORDER — IOPAMIDOL (ISOVUE-300) INJECTION 61%
75.0000 mL | Freq: Once | INTRAVENOUS | Status: AC | PRN
  Administered 2014-08-14: 75 mL via INTRAVENOUS

## 2014-08-21 ENCOUNTER — Encounter: Payer: Self-pay | Admitting: Family Medicine

## 2014-08-21 ENCOUNTER — Ambulatory Visit (INDEPENDENT_AMBULATORY_CARE_PROVIDER_SITE_OTHER): Payer: BLUE CROSS/BLUE SHIELD | Admitting: Family Medicine

## 2014-08-21 VITALS — BP 108/68 | HR 75 | Temp 97.9°F | Ht 73.5 in | Wt 241.6 lb

## 2014-08-21 DIAGNOSIS — Z720 Tobacco use: Secondary | ICD-10-CM | POA: Diagnosis not present

## 2014-08-21 DIAGNOSIS — Z0000 Encounter for general adult medical examination without abnormal findings: Secondary | ICD-10-CM | POA: Diagnosis not present

## 2014-08-21 DIAGNOSIS — F17200 Nicotine dependence, unspecified, uncomplicated: Secondary | ICD-10-CM

## 2014-08-21 LAB — LIPID PANEL
CHOLESTEROL: 134 mg/dL (ref 0–200)
HDL: 31.1 mg/dL — AB (ref >39.00)
LDL Cholesterol: 73 mg/dL (ref 0–99)
NonHDL: 102.9
TRIGLYCERIDES: 148 mg/dL (ref 0.0–149.0)
Total CHOL/HDL Ratio: 4
VLDL: 29.6 mg/dL (ref 0.0–40.0)

## 2014-08-21 LAB — HEMOGLOBIN A1C: Hgb A1c MFr Bld: 5.1 % (ref 4.6–6.5)

## 2014-08-21 NOTE — Progress Notes (Signed)
d HPI:  Here for CPE:  -Concerns and/or follow up today:   Seasonal Allergies: -reports saw and allergist in the past and allergic to everything -symptoms nasal congestion, clear discharge both eye -has apptoinment with his opthomologist about dry eyes  Rheumatoid Arthritis: -managed by rheumatologist, 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 Corena Pilgrim  -Hx of HTN: no  -Vaccines: UTD  -sexual activity: yes, male partner, no new partners  -wants STI testing, Hep C screening (if born 65-1965): no  -FH colon or prstate ca: see FH Last colon cancer screening: n/a Last prostate ca screening: father had prostate, no symptoms - he declined  -Alcohol, Tobacco, drug use: see social history  Review of Systems - no fevers, unintentional weight loss, vision loss, hearing loss, chest pain, sob, hemoptysis, melena, hematochezia, hematuria, genital discharge, changing or concerning skin lesions, bleeding, bruising, loc, thoughts of self harm or SI  Past Medical History  Diagnosis Date  . Shortness of breath   . Pneumonia   . Arthritis   . GERD (gastroesophageal reflux disease)   . History of MRSA infection     right leg per patient  . Septic bursitis 05/01/2011  . MSSA (methicillin susceptible Staphylococcus aureus) infection 05/01/2011  . Rheumatoid nodules 08/06/2014    right lung-per CT scan ordered by Dr Estanislado Pandy    Past Surgical History  Procedure Laterality  Date  . Cholecystectomy    . Carpel tunnel    . I&d extremity  03/24/2011    Procedure: IRRIGATION AND DEBRIDEMENT EXTREMITY;  Surgeon: Yvette Rack., MD;  Location: [place];  Service: Orthopedics;  Laterality: Right;  Irrigation and debridement right knee abcess and Bursa,  application of wound vac  . I&d extremity  03/28/2011    Procedure: IRRIGATION AND DEBRIDEMENT EXTREMITY;  Surgeon: Yvette Rack., MD;  Location: [place];  Service: Orthopedics;  Laterality: Right;  I&D right knee with wound closure  . Tonsillectomy      Family History  Problem Relation Age of Onset  . Arthritis Maternal Grandmother   . Colon cancer Paternal Grandfather     deceased  . Prostate cancer Father   . Hypercholesterolemia Mother   . Heart disease Mother     palpitations, ?heart failure  . Hypertension Mother   . Congestive Heart Failure Paternal Grandmother   . Angina Maternal Grandmother   . Other Mother     Heart enlargement,left ventricle problems    History   Social History  . Marital Status: Married    Spouse Name: N/A  . Number of Children: N/A  . Years of Education: N/A   Social History Main Topics  . Smoking status: Current Every Day Smoker -- 2.00 packs/day for 30 years    Types: Cigarettes  . Smokeless tobacco: Never Used  . Alcohol Use: No  . Drug Use: No  . Sexual Activity: Yes   Other Topics Concern  . None   Social History Narrative   Work or  School: lawn care      Home Situation: lives with wife whom sees me as well       Spiritual Beliefs: believes in God; does not go to church      Lifestyle: no CV exercise - but physically active at work; diet is poor           Current outpatient prescriptions:  .  ENBREL SURECLICK 50 MG/ML injection, 50 mg once a week. , Disp: , Rfl: 1 .  Esomeprazole Magnesium (NEXIUM PO), Take 22.3 mg by mouth daily. , Disp: , Rfl:  .  gabapentin (NEURONTIN) 300 MG capsule, 2 (two) times daily. , Disp: , Rfl: 1 .   HYDROcodone-acetaminophen (NORCO/VICODIN) 5-325 MG per tablet, , Disp: , Rfl: 0 .  methocarbamol (ROBAXIN) 500 MG tablet, , Disp: , Rfl: 2 .  sulfaSALAzine (AZULFIDINE) 500 MG EC tablet, Take 500 mg by mouth 2 (two) times daily., Disp: , Rfl:  .  traMADol (ULTRAM) 50 MG tablet, Take 50 mg by mouth 3 (three) times daily as needed. , Disp: , Rfl:   EXAM:  Filed Vitals:   08/21/14 0806  BP: 108/68  Pulse: 75  Temp: 97.9 F (36.6 C)  TempSrc: Oral  Height: 6' 1.5" (1.867 m)  Weight: 241 lb 9.6 oz (109.589 kg)    Estimated body mass index is 31.44 kg/(m^2) as calculated from the following:   Height as of this encounter: 6' 1.5" (1.867 m).   Weight as of this encounter: 241 lb 9.6 oz (109.589 kg).  GENERAL: vitals reviewed and listed below, alert, oriented, appears well hydrated and in no acute distress  HEENT: head atraumatic, PERRLA, normal appearance of eyes, ears, nose and mouth. moist mucus membranes.  NECK: supple, no masses or lymphadenopathy  LUNGS: clear to auscultation bilaterally, no rales, rhonchi or wheeze  CV: HRRR, no peripheral edema or cyanosis, normal pedal pulses  ABDOMEN: bowel sounds normal, soft, non tender to palpation, no masses, no rebound or guarding  GU: declined  RECTAL: skin tag, int hemorrhoid  SKIN: no rash or abnormal lesions  MS: normal gait, moves all extremities normally  NEURO: CN II-XII grossly intact, normal muscle strength and sensation to light touch on extremities  PSYCH: normal affect, pleasant and cooperative  ASSESSMENT AND PLAN:  Discussed the following assessment and plan:  Visit for preventive health examination - Plan: Hemoglobin A1C, Lipid Panel  Tobacco use disorder  -Discussed and advised all [place] preventive services health task force level A and B recommendations for age, sex and risks.  -Advised at least 150 minutes of exercise per week and a healthy diet low in saturated fats and sweets and consisting of fresh  fruits and vegetables, lean meats such as fish and white chicken and whole grains.  -offered referral for rectal findings, declined  -FASTING labs, studies and vaccines per orders this encounter   Patient advised to return to clinic immediately if symptoms worsen or persist or new concerns.  Patient Instructions  BEFORE YOU LEAVE: -labs -follow up in 6 months  We recommend the following healthy lifestyle measures: - eat a healthy diet consisting of lots of vegetables, fruits, beans, nuts, seeds, healthy meats such as white chicken and fish and whole grains.  - avoid fried foods, fast food, processed foods, sodas, red meet and other fattening foods.  - get a least 150 minutes of aerobic exercise per week.   -We have ordered labs or studies at this visit. It can take up to 1-2  weeks for results and processing. We will contact you with instructions IF your results are abnormal. Normal results will be released to your [facility] If you have not heard from [place] or can not find your results in [facility] in 2 weeks please contact our office.  Please quit smoking and follow up with [name] about the CT                 No Follow-up on file.   Colin Benton R.

## 2014-08-21 NOTE — Progress Notes (Signed)
Pre visit review using our clinic review tool, if applicable. No additional management support is needed unless otherwise documented below in the visit note. 

## 2014-08-21 NOTE — Patient Instructions (Signed)
BEFORE YOU LEAVE: -labs -follow up in 6 months  We recommend the following healthy lifestyle measures: - eat a healthy diet consisting of lots of vegetables, fruits, beans, nuts, seeds, healthy meats such as white chicken and fish and whole grains.  - avoid fried foods, fast food, processed foods, sodas, red meet and other fattening foods.  - get a least 150 minutes of aerobic exercise per week.   -We have ordered labs or studies at this visit. It can take up to 1-2 weeks for results and processing. We will contact you with instructions IF your results are abnormal. Normal results will be released to your [facility]. If you have not heard from [place] or can not find your results in [facility] in 2 weeks please contact our office.  Please quit smoking and follow up with [name] about the CT

## 2015-02-06 ENCOUNTER — Ambulatory Visit: Payer: BLUE CROSS/BLUE SHIELD | Admitting: *Deleted

## 2015-02-14 ENCOUNTER — Ambulatory Visit: Payer: BLUE CROSS/BLUE SHIELD | Admitting: *Deleted

## 2015-02-20 ENCOUNTER — Ambulatory Visit: Payer: BLUE CROSS/BLUE SHIELD | Admitting: Family Medicine

## 2015-04-08 HISTORY — PX: MENISCUS REPAIR: SHX5179

## 2015-09-27 ENCOUNTER — Emergency Department (HOSPITAL_COMMUNITY): Payer: BLUE CROSS/BLUE SHIELD

## 2015-09-27 ENCOUNTER — Emergency Department (HOSPITAL_COMMUNITY)
Admission: EM | Admit: 2015-09-27 | Discharge: 2015-09-27 | Disposition: A | Discharge status: Home / Self Care | Payer: BLUE CROSS/BLUE SHIELD | Attending: Emergency Medicine | Admitting: Emergency Medicine

## 2015-09-27 ENCOUNTER — Encounter (HOSPITAL_COMMUNITY): Payer: Self-pay | Admitting: Nurse Practitioner

## 2015-09-27 DIAGNOSIS — M79661 Pain in right lower leg: Secondary | ICD-10-CM | POA: Insufficient documentation

## 2015-09-27 DIAGNOSIS — F17210 Nicotine dependence, cigarettes, uncomplicated: Secondary | ICD-10-CM | POA: Insufficient documentation

## 2015-09-27 DIAGNOSIS — Z79899 Other long term (current) drug therapy: Secondary | ICD-10-CM | POA: Diagnosis not present

## 2015-09-27 DIAGNOSIS — M79604 Pain in right leg: Secondary | ICD-10-CM

## 2015-09-27 MED ORDER — ENOXAPARIN SODIUM 120 MG/0.8ML ~~LOC~~ SOLN
110.0000 mg | Freq: Once | SUBCUTANEOUS | Status: AC
  Administered 2015-09-27: 110 mg via SUBCUTANEOUS
  Filled 2015-09-27: qty 0.8

## 2015-09-27 MED ORDER — SULFAMETHOXAZOLE-TRIMETHOPRIM 800-160 MG PO TABS
1.0000 | ORAL_TABLET | Freq: Once | ORAL | Status: AC
  Administered 2015-09-27: 1 via ORAL
  Filled 2015-09-27: qty 1

## 2015-09-27 MED ORDER — SULFAMETHOXAZOLE-TRIMETHOPRIM 800-160 MG PO TABS: 1.0000 | ORAL_TABLET | Freq: Two times a day (BID) | ORAL | Status: DC

## 2015-09-27 NOTE — ED Notes (Signed)
Pt c/o 1 week history RLE pain from knee to foot. Pain is worse today. He denies any injuries. He also reports a sore on his L index finger and fluid filled sac to L elbow this week that are painful. He reports his rheumatologist referred him to ED for evalation of leg pain to r/o blood clots and for possible wound cultures due to history of MRSA.

## 2015-09-27 NOTE — ED Provider Notes (Signed)
CSN: [id number]     Arrival date & time 09/27/15  1639 History  By signing my name below, I, [name], attest that this documentation has been prepared under the direction and in the presence of Jola Schmidt, MD. Electronically Signed: [name], ED Scribe. 09/27/2015. 8:54 PM.   Chief Complaint  Patient presents with  . Leg Pain   The history is provided by the patient. No language interpreter was used.    HPI Comments: Jeff Russell is a 50 y.o. male who presents to the Emergency Department complaining of gradual onset, constant, right lower leg pain radiating to dorsum of right foot x 1 week, worsening today. He also complains of a numbness/tingling sensation to his right foot that began today. The pain is exacerbated with ambulating. No injury, trauma, or fall. Pt has had similar pain a couple of years ago when his leg was infected. Pt's rheumatologist referred him to the ED today to rule out blood clots. Denies weakness, back pain, buttock pain, or any other associated symptoms. Pt is not currently on anticoagulants.   He also complains of left elbow pain and a sore to his left index finger. Pt has hx of MRSA and is concerned the sore could be MRSA.     Past Medical History  Diagnosis Date  . Shortness of breath   . Pneumonia   . Arthritis   . GERD (gastroesophageal reflux disease)   . History of MRSA infection     right leg per patient  . Septic bursitis 05/01/2011  . MSSA (methicillin susceptible Staphylococcus aureus) infection 05/01/2011  . Rheumatoid nodules ([place]) 08/06/2014    right lung-per CT scan ordered by Dr Estanislado Pandy   Past Surgical History  Procedure Laterality Date  . Cholecystectomy    . Carpel tunnel    . I&d extremity  03/24/2011    Procedure: IRRIGATION AND DEBRIDEMENT EXTREMITY;  Surgeon: Yvette Rack., MD;  Location: [place];  Service: Orthopedics;  Laterality: Right;  Irrigation and debridement right knee abcess and Bursa,  application of wound  vac  . I&d extremity  03/28/2011    Procedure: IRRIGATION AND DEBRIDEMENT EXTREMITY;  Surgeon: Yvette Rack., MD;  Location: [place];  Service: Orthopedics;  Laterality: Right;  I&D right knee with wound closure  . Tonsillectomy     Family History  Problem Relation Age of Onset  . Arthritis Maternal Grandmother   . Colon cancer Paternal Grandfather     deceased  . Prostate cancer Father   . Hypercholesterolemia Mother   . Heart disease Mother     palpitations, ?heart failure  . Hypertension Mother   . Congestive Heart Failure Paternal Grandmother   . Angina Maternal Grandmother   . Other Mother     Heart enlargement,left ventricle problems   Social History  Substance Use Topics  . Smoking status: Current Every Day Smoker -- 2.00 packs/day for 30 years    Types: Cigarettes  . Smokeless tobacco: Never Used  . Alcohol Use: No    Review of Systems  All other systems reviewed and are negative.  A complete 10 system review of systems was obtained and all systems are negative except as noted in the HPI and PMH.   Allergies  Doxycycline  Home Medications   Prior to Admission medications   Medication Sig Start Date End Date Taking? Authorizing Provider  ENBREL SURECLICK 50 MG/ML injection 50 mg once a week.  04/20/14   Historical Provider, MD  Esomeprazole Magnesium (NEXIUM PO) Take 22.3 mg by mouth daily.     Historical Provider, MD  gabapentin (NEURONTIN) 300 MG capsule 2 (two) times daily.  04/18/14   Historical Provider, MD  HYDROcodone-acetaminophen (NORCO/VICODIN) 5-325 MG per tablet  04/11/14   Historical Provider, MD  methocarbamol (ROBAXIN) 500 MG tablet  04/24/14   Historical Provider, MD  sulfaSALAzine (AZULFIDINE) 500 MG EC tablet Take 500 mg by mouth 2 (two) times daily.    Historical Provider, MD  traMADol (ULTRAM) 50 MG tablet Take 50 mg by mouth 3 (three) times daily as needed.     Historical Provider, MD   BP 136/92 mmHg  Pulse 70  Temp(Src) 98.1 F (36.7 C)  (Oral)  Resp 14  SpO2 99% Physical Exam  Constitutional: He is oriented to person, place, and time. He appears well-developed and well-nourished.  HENT:  Head: Normocephalic and atraumatic.  Eyes: EOM are normal.  Neck: Normal range of motion.  Cardiovascular: Normal rate, regular rhythm, normal heart sounds and intact distal pulses.   Normal PT and DP pulse right foot  Pulmonary/Chest: Effort normal and breath sounds normal. No respiratory distress.  Abdominal: Soft. He exhibits no distension. There is no tenderness.  Musculoskeletal: Normal range of motion. He exhibits tenderness. He exhibits no edema.  No increased warmth or erythema to right lower leg.  Mild tenderness anterior right tibia Full ROM right ankle and right knee No swelling of RLE as compared to left  Neurological: He is alert and oriented to person, place, and time.  Skin: Skin is warm and dry.  Psychiatric: He has a normal mood and affect. Judgment normal.  Nursing note and vitals reviewed.   ED Course  Procedures (including critical care time)  DIAGNOSTIC STUDIES: Oxygen Saturation is 99% on RA, normal by my interpretation.    COORDINATION OF CARE: 8:49 PM-Discussed treatment plan which includes DG R Tib/Fib with pt at bedside and pt agreed to plan.   Labs Review Labs Reviewed - No data to display  Imaging Review Dg Tibia/fibula Right  09/27/2015  CLINICAL DATA:  Right lower leg pain x 1 wk, pain from knee down, worse today. Multiple wounds and lesions to lower leg, pt sent to ED by rheumatologist for wound culture and possible blood clots. Smoker. No other chest complaints. EXAM: RIGHT TIBIA AND FIBULA - 2 VIEW COMPARISON:  06/25/2015 FINDINGS: There is no evidence of fracture or other focal bone lesions. Small calcaneal spurs. Soft tissues are unremarkable. No radiodense foreign body. No subcutaneous gas. IMPRESSION: No acute abnormality. Electronically Signed   By: Lucrezia Europe M.D.   On: 09/27/2015 22:34    I have personally reviewed and evaluated these images and lab results as part of my medical decision-making.   EKG Interpretation None      MDM   Final diagnoses:  Right leg pain    Normal pulses right foot.  No signs of infection or cellulitis.  No significant swelling of the right lower extremity as compared to left.  Patient be given a single dose Lovenox at this time and brought back to the hospital in the morning for a venous duplex of the right lower extremity.  Doubt myositis.  Plain film of the right tibia is normal.  Patient will need primary care follow-up and a vascular duplex in the morning.  He understands to return to the ER for new or worsening symptoms   I personally performed the services described in this documentation, which was scribed in  my presence. The recorded information has been reviewed and is accurate.         Jola Schmidt, MD 09/27/15 [phone]

## 2015-09-27 NOTE — ED Notes (Signed)
Patient transported to X-ray 

## 2015-09-27 NOTE — ED Notes (Signed)
Pt ambulated to the bathroom.  

## 2015-09-27 NOTE — Discharge Instructions (Signed)
IMPORTANT PATIENT INSTRUCTIONS:  ° °You have been scheduled for an Outpatient Vascular Study at [facility].   ° °If tomorrow is a Saturday or Sunday, please go to the [facility] registration desk at 8 AM tomorrow morning and tell them you are therefore a vascular study. ° °If tomorrow is a weekday (Monday - Friday), please go to the  Admitting Department at 8 AM and tell them you are therefore a vascular study ° ° °

## 2015-09-28 ENCOUNTER — Inpatient Hospital Stay (HOSPITAL_COMMUNITY): Admission: RE | Admit: 2015-09-28 | Payer: BLUE CROSS/BLUE SHIELD | Source: Ambulatory Visit

## 2015-09-28 ENCOUNTER — Other Ambulatory Visit (HOSPITAL_COMMUNITY): Payer: Self-pay | Admitting: Rheumatology

## 2015-09-28 ENCOUNTER — Ambulatory Visit (HOSPITAL_COMMUNITY)
Admission: RE | Admit: 2015-09-28 | Discharge: 2015-09-28 | Disposition: A | Discharge status: Home / Self Care | Payer: BLUE CROSS/BLUE SHIELD | Source: Ambulatory Visit | Attending: Emergency Medicine | Admitting: Emergency Medicine

## 2015-09-28 ENCOUNTER — Ambulatory Visit (HOSPITAL_COMMUNITY)
Admission: RE | Admit: 2015-09-28 | Discharge: 2015-09-28 | Disposition: A | Discharge status: Home / Self Care | Payer: BLUE CROSS/BLUE SHIELD | Source: Ambulatory Visit | Attending: Rheumatology | Admitting: Rheumatology

## 2015-09-28 DIAGNOSIS — M79604 Pain in right leg: Secondary | ICD-10-CM

## 2015-09-28 DIAGNOSIS — M79609 Pain in unspecified limb: Secondary | ICD-10-CM | POA: Diagnosis not present

## 2015-09-28 DIAGNOSIS — K219 Gastro-esophageal reflux disease without esophagitis: Secondary | ICD-10-CM | POA: Diagnosis not present

## 2015-09-28 NOTE — Progress Notes (Signed)
VASCULAR LAB PRELIMINARY  PRELIMINARY  PRELIMINARY  PRELIMINARY  Right lower extremity venous duplex has been completed.    Right:  No evidence of DVT, superficial thrombosis, or Baker's cyst.  Bilaterally: lymph nodes in groin areas.  Janifer Adie, RVT, RDMS 09/28/2015, 9:49 AM

## 2015-10-02 ENCOUNTER — Ambulatory Visit (INDEPENDENT_AMBULATORY_CARE_PROVIDER_SITE_OTHER): Payer: BLUE CROSS/BLUE SHIELD | Admitting: Family Medicine

## 2015-10-02 ENCOUNTER — Encounter: Payer: Self-pay | Admitting: Family Medicine

## 2015-10-02 VITALS — BP 118/78 | HR 84 | Temp 98.0°F | Ht 73.5 in | Wt 260.6 lb

## 2015-10-02 DIAGNOSIS — L03115 Cellulitis of right lower limb: Secondary | ICD-10-CM

## 2015-10-02 DIAGNOSIS — M7989 Other specified soft tissue disorders: Secondary | ICD-10-CM

## 2015-10-02 MED ORDER — SULFAMETHOXAZOLE-TRIMETHOPRIM 800-160 MG PO TABS: 1.0000 | ORAL_TABLET | Freq: Two times a day (BID) | ORAL | Status: DC

## 2015-10-02 MED ORDER — CEPHALEXIN 500 MG PO CAPS
500.0000 mg | ORAL_CAPSULE | Freq: Two times a day (BID) | ORAL | Status: DC
Start: 2015-10-02 — End: 2015-11-20

## 2015-10-02 NOTE — Patient Instructions (Signed)
BEFORE YOU LEAVE: -schedule follow up on Friday  Start the antibiotics  Elevate legs for 30 minutes twice daily  Follow up sooner if needed

## 2015-10-02 NOTE — Progress Notes (Signed)
HPI:  Jeff Russell is here for an acute visit for leg pain and swelling. He reports he had a few sores on his right leg last week. He then reports the development of pain and swelling in the right lower leg in the anterior tibial region. He does not think there was much redness. He saw his rheumatologist for this and was sent to the emergency department to rule out a blood clot or infection. X-rays, ultrasound duplex were negative. He then followed up with his rheumatologist again and an MRI and lab work were done. The labs including a CBC and CMP were unremarkable. The MRI report shows edema, but no soft tissue abscess, mild fasciitis, IMI sodas or osteomyelitis. He reports his rheumatologist told him everything looked good, but then he received a call from a nurse who told him he had cellulitis. He reports his rheumatologist then told him to follow up with me to see if he needed treatment for the cellulitis. Reports he was given one pill of Bactrim last week during this ordeal for possible cellulitis. He is very worried about cellulitis as has a reported history of MRSA infection. He has been on immunosuppressive therapy with his rheumatologist. Denies fevers, malaise, trauma, chest pain, shortness of breath or swelling elsewhere. Has history of venous insufficiency per his report, sees vascular doctor. ROS: See pertinent positives and negatives per HPI.  Past Medical History  Diagnosis Date  . Shortness of breath   . Pneumonia   . Arthritis   . GERD (gastroesophageal reflux disease)   . History of MRSA infection     right leg per patient  . Septic bursitis 05/01/2011  . MSSA (methicillin susceptible Staphylococcus aureus) infection 05/01/2011  . Rheumatoid nodules ([place]) 08/06/2014    right lung-per CT scan ordered by Dr Estanislado Pandy    Past Surgical History  Procedure Laterality Date  . Cholecystectomy    . Carpel tunnel    . I&d extremity  03/24/2011    Procedure: IRRIGATION AND  DEBRIDEMENT EXTREMITY;  Surgeon: Yvette Rack., MD;  Location: [place];  Service: Orthopedics;  Laterality: Right;  Irrigation and debridement right knee abcess and Bursa,  application of wound vac  . I&d extremity  03/28/2011    Procedure: IRRIGATION AND DEBRIDEMENT EXTREMITY;  Surgeon: Yvette Rack., MD;  Location: [place];  Service: Orthopedics;  Laterality: Right;  I&D right knee with wound closure  . Tonsillectomy      Family History  Problem Relation Age of Onset  . Arthritis Maternal Grandmother   . Colon cancer Paternal Grandfather     deceased  . Prostate cancer Father   . Hypercholesterolemia Mother   . Heart disease Mother     palpitations, ?heart failure  . Hypertension Mother   . Congestive Heart Failure Paternal Grandmother   . Angina Maternal Grandmother   . Other Mother     Heart enlargement,left ventricle problems    Social History   Social History  . Marital Status: Married    Spouse Name: N/A  . Number of Children: N/A  . Years of Education: N/A   Social History Main Topics  . Smoking status: Current Every Day Smoker -- 2.00 packs/day for 30 years    Types: Cigarettes  . Smokeless tobacco: Never Used  . Alcohol Use: No  . Drug Use: No  . Sexual Activity: Yes   Other Topics Concern  . 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  acetaminophen (TYLENOL ARTHRITIS PAIN) 650 MG CR tablet, Take 650 mg by mouth 2 (two) times daily. In the morning and at lunch(time), Disp: , Rfl:  .  cyclobenzaprine (FLEXERIL) 10 MG tablet, Take 10 mg by mouth at bedtime., Disp: , Rfl: 0 .  Esomeprazole Magnesium (NEXIUM PO), Take 22.3 mg by mouth every morning. , Disp: , Rfl:  .  fluconazole (DIFLUCAN) 200  MG tablet, , Disp: , Rfl: 0 .  gabapentin (NEURONTIN) 300 MG capsule, Take 600 mg by mouth 3 (three) times daily. , Disp: , Rfl: 1 .  HYDROcodone-acetaminophen (NORCO/VICODIN) 5-325 MG per tablet, Take 1 tablet by mouth at bedtime. , Disp: , Rfl: 0 .  methocarbamol (ROBAXIN) 500 MG tablet, Take 500 mg by mouth 2 (two) times daily as needed for muscle spasms. , Disp: , Rfl: 2 .  sulfaSALAzine (AZULFIDINE) 500 MG EC tablet, Take 500 mg by mouth 2 (two) times daily., Disp: , Rfl:  .  traMADol (ULTRAM) 50 MG tablet, Take 50 mg by mouth 3 (three) times daily as needed. , Disp: , Rfl:  .  XELJANZ XR 11 MG TB24, Take 1 tablet by mouth every morning., Disp: , Rfl: 0 .  cephALEXin (KEFLEX) 500 MG capsule, Take 1 capsule (500 mg total) by mouth 2 (two) times daily., Disp: 10 capsule, Rfl: 0 .  sulfamethoxazole-trimethoprim (BACTRIM DS) 800-160 MG tablet, Take 1 tablet by mouth 2 (two) times daily., Disp: 10 tablet, Rfl: 0  EXAM:  Filed Vitals:   10/02/15 1005  BP: 118/78  Pulse: 84  Temp: 98 F (36.7 C)    Body mass index is 33.91 kg/(m^2).  GENERAL: vitals reviewed and listed above, alert, oriented, appears well hydrated and in no acute distress  HEENT: atraumatic, conjunttiva clear, no obvious abnormalities on inspection of external nose and ears  NECK: no obvious masses on inspection  LUNGS: clear to auscultation bilaterally, no wheezes, rales or rhonchi, good air movement  CV: HRRR, Bilateral trace pitting edema in the lower extremities to the mid calf, worse on the right.  MS: moves all extremities without noticeable abnormality  Skin: One small pustule and 1 small scab over papule on the right anterior lower leg, very mild erythema of the skin in the right lower leg anteriorly with questionable warmth compared to the other side.  PSYCH: pleasant and cooperative, no obvious depression or anxiety  ASSESSMENT AND PLAN:  Discussed the following assessment and plan:  Leg  swelling  Cellulitis of right lower extremity  -This may simply be his chronic venous insufficiency and some painful swelling, swelling is not that impressive on exam -Several skin lesions and mild erythema of the skin, given the MRI report and his concerns after discussion of risk we opted to go ahead and treat for possible cellulitis with close follow-up -Advised he notify his rheumatologist of the plan as well -If symptoms remain, would have him follow-up with his vascular doctor -Patient advised to return or notify a doctor immediately if symptoms worsen or persist or new concerns arise.  Patient Instructions  BEFORE YOU LEAVE: -schedule follow up on Friday  Start the antibiotics  Elevate legs for 30 minutes twice daily  Follow up sooner if needed       Xadrian Craighead R.

## 2015-10-02 NOTE — Progress Notes (Signed)
Pre visit review using our clinic review tool, if applicable. No additional management support is needed unless otherwise documented below in the visit note. 

## 2015-10-04 ENCOUNTER — Other Ambulatory Visit (HOSPITAL_COMMUNITY): Payer: Self-pay | Admitting: Rheumatology

## 2015-10-04 DIAGNOSIS — R918 Other nonspecific abnormal finding of lung field: Secondary | ICD-10-CM

## 2015-10-05 ENCOUNTER — Ambulatory Visit: Payer: BLUE CROSS/BLUE SHIELD | Admitting: Family Medicine

## 2015-10-11 ENCOUNTER — Ambulatory Visit (HOSPITAL_COMMUNITY)
Admission: RE | Admit: 2015-10-11 | Discharge: 2015-10-11 | Disposition: A | Discharge status: Home / Self Care | Payer: BLUE CROSS/BLUE SHIELD | Source: Ambulatory Visit | Attending: Rheumatology | Admitting: Rheumatology

## 2015-10-11 ENCOUNTER — Encounter (HOSPITAL_COMMUNITY): Payer: Self-pay

## 2015-10-11 DIAGNOSIS — R590 Localized enlarged lymph nodes: Secondary | ICD-10-CM | POA: Diagnosis not present

## 2015-10-11 DIAGNOSIS — R918 Other nonspecific abnormal finding of lung field: Secondary | ICD-10-CM | POA: Insufficient documentation

## 2015-10-11 MED ORDER — IOPAMIDOL (ISOVUE-300) INJECTION 61%
75.0000 mL | Freq: Once | INTRAVENOUS | Status: AC | PRN
  Administered 2015-10-11: 75 mL via INTRAVENOUS

## 2015-11-13 ENCOUNTER — Encounter: Payer: Self-pay | Admitting: Family Medicine

## 2015-11-13 ENCOUNTER — Ambulatory Visit (INDEPENDENT_AMBULATORY_CARE_PROVIDER_SITE_OTHER): Payer: BLUE CROSS/BLUE SHIELD | Admitting: Family Medicine

## 2015-11-13 VITALS — BP 100/70 | HR 61 | Temp 97.8°F | Ht 73.5 in | Wt 257.8 lb

## 2015-11-13 DIAGNOSIS — Z01818 Encounter for other preprocedural examination: Secondary | ICD-10-CM | POA: Diagnosis not present

## 2015-11-13 DIAGNOSIS — M25561 Pain in right knee: Secondary | ICD-10-CM | POA: Diagnosis not present

## 2015-11-13 NOTE — Progress Notes (Signed)
No chief complaint on file.   HPI:  Patient is seen for optimization of general medical care prior to surgery. Surgery type: knee arthroscopy - R knee; meniscal tear - pain is debilitating Date of surgery: 11/21/15  Per patient questionnaire and review with pt: Kidney disease? No Prior surgeries/Issues following anesthesia? Adenoidectomy as a child, cholecystectomy, multiple knee surgeries (most recently about 5 years ago), eye surgeries Hx MI, heart arrythmia, CHF, angina or stroke? none Epilepsy or Seizures? none Arthritis or problems with neck or jaw? Rheumatoid arthritis Thyroid disease? none Liver disease? None, normal LFTs with rheum recently Asthma, COPD or chronic lung disease? No reported or symptoms, recent CT scan with rheumatologist show mild emphysematous changes and pulm nodules - will be seeing Dr. Melvyn Novas in pulmonology prior to surgery Diabetes? none  Other: Poor nutrition, Frail or other: no  METS:  ?Can take care of self, such as eat, dress, or use the toilet (1 MET). yes ?Can walk up a flight of steps or a hill (4 METs).yes ?Can do heavy work around the house such as scrubbing floors or lifting or moving heavy furniture (between 4 and 10 METs). yes ?Can participate in strenuous sports such as swimming, singles tennis, football, basketball, and skiing (>10 METs) reports he physically could, does not currently -works and is very active physically with his job . AHA Risks: Major predictors that require intensive management and may lead to delay in or cancellation of the operative procedure unless emergent: NONE of the following:  . Unstable coronary syndromes including unstable or severe angina or recent MI  . Decompensated heart failure including NYHA functional class IV or worsening or new-onset HF  . Significant arrhythmias including high grade AV block, symptomatic ventricular arrhythmias, supraventricular arrhythmias with ventricular rate >100 bpm at rest,  symptomatic bradycardia, and newly recognized ventricular tachycardia  . Severe heart valve disease including severe aortic stenosis or symptomatic mitral stenosis   Other clinical predictors that warrant careful assessment of current status: NONE of the following  . History of ischemic heart disease . History of cerebrovascular disease  . History of compensated heart failure or prior heart failure  . Diabetes mellitus  . Renal insufficiency  Type of surgery and Risk: 1) High risk (reported risk of cardiac death or nonfatal myocardial infarction [MI] often greater than 5 percent):  Marland Kitchen Aortic and other major vascular surgery  . Peripheral artery surgery   2)Intermediate risk (reported risk of cardiac death or nonfatal MI generally 1 to 5 percent):  Marland Kitchen Carotid endarterectomy  . Head and neck surgery  . Intraperitoneal and intrathoracic surgery  . Orthopedic surgery  . Prostate surgery   3)Low risk (reported risk of cardiac death or nonfatal MI generally less than 1 percent):  Marland Kitchen Ambulatory surgery  . Endoscopic procedures  . Superficial procedure  . Cataract surgery  . Breast surgery  Medications that need to be addressed prior to surgery:none that I rx,  rheumatological medications should be addressed by presciber/specialist - he reports he is to hold these for 2 weeks before and after.  Discontinue acei/arbs/non-statin lipid lowering drugs day of surgery  ASA stop 7 days before or discuss with cardiology if CV risks, other anticoagulants discuss with cardiology.  ROS: See pertinent positives and negatives per HPI. 11 point ROS negative except where noted.  Past Medical History:  Diagnosis Date  . Arthritis   . GERD (gastroesophageal reflux disease)   . History of MRSA infection    right leg per patient  .  MSSA (methicillin susceptible Staphylococcus aureus) infection 05/01/2011  . Pneumonia   . Rheumatoid nodules ([place]) 08/06/2014   right lung-per CT scan ordered by Dr Estanislado Pandy   . Septic bursitis 05/01/2011  . Shortness of breath     Past Surgical History:  Procedure Laterality Date  . carpel tunnel    . CHOLECYSTECTOMY    . I&D EXTREMITY  03/24/2011   Procedure: IRRIGATION AND DEBRIDEMENT EXTREMITY;  Surgeon: Yvette Rack., MD;  Location: [place];  Service: Orthopedics;  Laterality: Right;  Irrigation and debridement right knee abcess and Bursa,  application of wound vac  . I&D EXTREMITY  03/28/2011   Procedure: IRRIGATION AND DEBRIDEMENT EXTREMITY;  Surgeon: Yvette Rack., MD;  Location: [place];  Service: Orthopedics;  Laterality: Right;  I&D right knee with wound closure  . TONSILLECTOMY      Family History  Problem Relation Age of Onset  . Arthritis Maternal Grandmother   . Colon cancer Paternal Grandfather     deceased  . Prostate cancer Father   . Hypercholesterolemia Mother   . Heart disease Mother     palpitations, ?heart failure  . Hypertension Mother   . Congestive Heart Failure Paternal Grandmother   . Angina Maternal Grandmother   . Other Mother     Heart enlargement,left ventricle problems    Social History   Social History  . Marital status: Married    Spouse name: N/A  . Number of children: N/A  . Years of education: N/A   Social History Main Topics  . Smoking status: Current Every Day Smoker    Packs/day: 2.00    Years: 30.00    Types: Cigarettes  . Smokeless tobacco: Never Used  . Alcohol use No  . Drug use: No  . Sexual activity: Yes   Other Topics Concern  . None   Social History Narrative   Work or School: Art gallery manager Situation: lives with wife whom sees me as well       Spiritual Beliefs: believes in God; does not go to church      Lifestyle: no CV exercise - but physically active at work; diet is poor           Current Outpatient Prescriptions:  .  acetaminophen (TYLENOL ARTHRITIS PAIN) 650 MG CR tablet, Take 650 mg by mouth 2 (two) times daily. In the morning and at lunch(time), Disp: , Rfl:   .  cephALEXin (KEFLEX) 500 MG capsule, Take 1 capsule (500 mg total) by mouth 2 (two) times daily., Disp: 10 capsule, Rfl: 0 .  cyclobenzaprine (FLEXERIL) 10 MG tablet, Take 10 mg by mouth at bedtime., Disp: , Rfl: 0 .  Esomeprazole Magnesium (NEXIUM PO), Take 22.3 mg by mouth every morning. , Disp: , Rfl:  .  fluconazole (DIFLUCAN) 200 MG tablet, , Disp: , Rfl: 0 .  gabapentin (NEURONTIN) 300 MG capsule, Take 600 mg by mouth 3 (three) times daily. , Disp: , Rfl: 1 .  HYDROcodone-acetaminophen (NORCO/VICODIN) 5-325 MG per tablet, Take 1 tablet by mouth at bedtime. , Disp: , Rfl: 0 .  methocarbamol (ROBAXIN) 500 MG tablet, Take 500 mg by mouth 2 (two) times daily as needed for muscle spasms. , Disp: , Rfl: 2 .  sulfamethoxazole-trimethoprim (BACTRIM DS) 800-160 MG tablet, Take 1 tablet by mouth 2 (two) times daily., Disp: 10 tablet, Rfl: 0 .  sulfaSALAzine (AZULFIDINE) 500 MG EC tablet, Take 500 mg by mouth 2 (two) times  daily., Disp: , Rfl:  .  traMADol (ULTRAM) 50 MG tablet, Take 50 mg by mouth 3 (three) times daily as needed. , Disp: , Rfl:  .  XELJANZ XR 11 MG TB24, Take 1 tablet by mouth every morning., Disp: , Rfl: 0  EXAM:  Vitals:   11/13/15 1601  BP: 100/70  Pulse: 61  Temp: 97.8 F (36.6 C)    Body mass index is 33.55 kg/m.  GENERAL: vitals reviewed and listed above, alert, oriented, appears well hydrated and in no acute distress  HEENT: atraumatic, conjunttiva clear, no obvious abnormalities on inspection of external nose and ears  NECK: no obvious masses on inspection, no carotid bruits  LUNGS: clear to auscultation bilaterally, no wheezes, rales or rhonchi, good air movement  CV: HRRR, no peripheral edema, no JVD, BP normal range, normal radial pulses  MS: moves all extremities without noticeable abnormality  PSYCH: pleasant and cooperative, no obvious depression or anxiety  ASSESSMENT AND PLAN:  Discussed the following assessment and plan: More than 50% of  over 40 minutes spent in total in caring for this patient was spent face-to-face with the patient, counseling and/or coordinating care.   Pre-op examination  Right knee pain  Assessment: -pre-op visit prior to knee arthroscopy -Risk factors: rheumatoid arthritis, smoking history --> advise anesthesiology preo-op exam -Surgery Risks:intermediate -age, nutritional status, fraility: good nutritional status, age <65, no fraility -functional capacity: > 4-10 METs wethout symptoms -comorbidities: no sig or active heart disease, sees rheumatologist for rheumatoid arthritis, will be seeing pulmonologist prior to surgery as well  Recommendations for optimizing general medical care prior to surgery: -advise anesthesia pre-op evaluation prior to surgery given history rheumatoid arthritis, ? emphysema and history smoking, consider EKG prior to surgery at anesthesiologist's decretion -advise to follow rheumatology recommendations perioperatively regarding all rheumatological medications -I am happy that he will be seeing the pulmonologist, no respiratory symptoms -advised patient to discuss specific risks morbidity and mortality of surgery with surgeon, CV risks discussed with patient -advised patient will defer to surgeon for post-op DVT prophylaxis, infection prevention and post op care -form for pre-op optimization of general medical care prior to surgery faxed to surgeon office along with these instructions and notes    There are no Patient Instructions on file for this visit.   Colin Benton R.

## 2015-11-13 NOTE — Progress Notes (Signed)
Pre visit review using our clinic review tool, if applicable. No additional management support is needed unless otherwise documented below in the visit note. 

## 2015-11-20 ENCOUNTER — Ambulatory Visit (INDEPENDENT_AMBULATORY_CARE_PROVIDER_SITE_OTHER): Payer: BLUE CROSS/BLUE SHIELD | Admitting: Internal Medicine

## 2015-11-20 ENCOUNTER — Encounter: Payer: Self-pay | Admitting: Internal Medicine

## 2015-11-20 VITALS — BP 126/72 | HR 95 | Ht 73.5 in | Wt 259.4 lb

## 2015-11-20 DIAGNOSIS — R918 Other nonspecific abnormal finding of lung field: Secondary | ICD-10-CM | POA: Diagnosis not present

## 2015-11-20 DIAGNOSIS — F17210 Nicotine dependence, cigarettes, uncomplicated: Secondary | ICD-10-CM

## 2015-11-20 DIAGNOSIS — Z720 Tobacco use: Secondary | ICD-10-CM | POA: Diagnosis not present

## 2015-11-20 NOTE — Patient Instructions (Addendum)
My strong recommendation is to stop you all smoking before the smoking stops you   Please schedule a follow up visit in 3 months but call sooner if needed with pfts on return and set up follow up for the lung nodules then

## 2015-11-20 NOTE — Progress Notes (Signed)
Subjective:    Patient ID: Dail Balius Hedges, male    DOB: 02-11-1966,    MRN: [id number]  HPI   71 yowm active smoker with RA referred to pulmonary clinic 11/20/2015 by Dr Corena Pilgrim  for [place].    11/20/2015 1st [facility] office visit/ Olivya Sobol   Chief Complaint  Patient presents with  . Advice Only    Referred by [name] for pulm nodules- CT chest in Epic. Pt denies any breathing complaints.     not limited by breathing but by arthritis/ no skin nodules.  No obvious day to day or daytime variability or assoc excess/ purulent sputum or mucus plugs or hemoptysis or cp or chest tightness, subjective wheeze or overt sinus or hb symptoms. No unusual exp hx or h/o childhood pna/ asthma or knowledge of premature birth.  Sleeping ok without nocturnal  or early am exacerbation  of respiratory  c/o's or need for noct saba. Also denies any obvious fluctuation of symptoms with weather or environmental changes or other aggravating or alleviating factors except as outlined above   Current Medications, Allergies, Complete Past Medical History, Past Surgical History, Family History, and Social History were reviewed in [employer] record.       Review of Systems  Constitutional: Negative for fever and unexpected weight change.  HENT: Negative for congestion, dental problem, ear pain, nosebleeds, postnasal drip, rhinorrhea, sinus pressure, sneezing, sore throat and trouble swallowing.   Eyes: Negative for redness and itching.  Respiratory: Negative for cough, chest tightness, shortness of breath and wheezing.   Cardiovascular: Negative for palpitations and leg swelling.  Gastrointestinal: Negative for nausea and vomiting.  Genitourinary: Negative for dysuria.  Musculoskeletal: Negative for joint swelling.  Skin: Negative for rash.  Neurological: Negative for headaches.  Hematological: Does not bruise/bleed easily.  Psychiatric/Behavioral: Negative for dysphoric  mood. The patient is not nervous/anxious.        Objective:   Physical Exam  amb wm gruff voice   Wt Readings from Last 3 Encounters:  11/20/15 259 lb 6.4 oz (117.7 kg)  11/13/15 257 lb 12.8 oz (116.9 kg)  10/02/15 260 lb 9.6 oz (118.2 kg)    Vital signs reviewed  HEENT: nl dentition, turbinates, and oropharynx. Nl external ear canals without cough reflex   NECK :  without JVD/Nodes/TM/ nl carotid upstrokes bilaterally   LUNGS: no acc muscle use,  Nl contour chest which is clear to A and P bilaterally without cough on insp or exp maneuvers   CV:  RRR  no s3 or murmur or increase in P2, no edema   ABD:  soft and nontender with nl inspiratory excursion in the supine position. No bruits or organomegaly, bowel sounds nl  MS:  Nl gait/  no calf tenderness, cyanosis or clubbing Pos RA changes both hands. No extensor nodules   SKIN: warm and dry without lesions    NEURO:  alert, approp, nl sensorium with  no motor deficits    I personally reviewed images and agree with radiology impression as follows:  CT Chest   10/11/15  Vs 08/14/14  1. Again noted bilateral pulmonary nodules predominantly subpleural location. The nodules are stable in size in appearance from prior exam. Largest nodule in right apex measures 2 cm. Largest nodule in right upper lobe anteriorly measures 1.6 x 1.6 cm. No definite new pulmonary nodules are noted. The nodules are stable in size in appearance from prior exam. This suggests a benign etiology such as rheumatoid  nodules, post inflammatory nodules sarcoid, chronic granulomatous disease less likely prior asbestos exposure. Would be atypical for metastatic disease. If there is clinical concern for metastatic disease further correlation with PET scan is again recommended. Follow-up CT scan in 3-6 months is recommended to assure stability. 2. Stable borderline enlarged right hilar lymph node. No mediastinal adenopathy. 3. Degenerative changes thoracic  spine. 4. Mild emphysematous changes bilateral upper lobes again noted.          Assessment & Plan:

## 2015-11-21 ENCOUNTER — Encounter: Payer: Self-pay | Admitting: Internal Medicine

## 2015-11-21 DIAGNOSIS — R918 Other nonspecific abnormal finding of lung field: Secondary | ICD-10-CM | POA: Insufficient documentation

## 2015-11-21 NOTE — Assessment & Plan Note (Addendum)
>   3 min discussion I reviewed the Fletcher curve with the patient that basically indicates  if you quit smoking when your best day FEV1 is [place] well preserved (as is probably Beer  the case here)  it is highly unlikely you will progress to severe disease and informed the patient there was  no medication on the market that has proven to alter the curve/ its downward trajectory  or the likelihood of progression of their disease(unlike other chronic medical conditions such as atheroclerosis where we do think we can change the natural hx with risk reducing meds)    Therefore stopping smoking and maintaining abstinence is the most important aspect of care, not choice of inhalers or for that matter, doctors.   

## 2015-11-21 NOTE — Assessment & Plan Note (Addendum)
1st detected 08/08/14 vs prev neg cxr in 03/2011  - no change by CT chest 10/11/15 > rec f/u 6 m as also active smoker   PET scanning is not specific enough in RA lung dz to distinguish inflammatory nodules from Ca and there are too many to attempt to bx or remove and these lesions are stable over the last year so it is reasonable to repeat the ct in 6 months as per guidelines.  In meantime needs to work on quit smoking (see separate a/p) and return for baseline pfts to eval for copd/ other ILD related to RA   Discussed in detail all the  indications, usual  risks and alternatives  relative to the benefits with patient who agrees to proceed with conservative f/u as outlined   Total time devoted to counseling  = 35/9m review case with pt/ discussion of options/alternatives/ personally creating written instructions  in presence of pt  then going over those specific  Instructions directly with the pt including how to use all of the meds but in particular covering each new medication in detail and the difference between the maintenance/automatic meds and the prns using an action plan format for the latter.

## 2015-12-13 ENCOUNTER — Ambulatory Visit (HOSPITAL_COMMUNITY)
Admission: RE | Admit: 2015-12-13 | Discharge: 2015-12-13 | Disposition: A | Discharge status: Home / Self Care | Payer: BLUE CROSS/BLUE SHIELD | Source: Ambulatory Visit | Attending: Cardiovascular Disease | Admitting: Cardiovascular Disease

## 2015-12-13 ENCOUNTER — Other Ambulatory Visit (HOSPITAL_COMMUNITY): Payer: Self-pay | Admitting: Orthopedic Surgery

## 2015-12-13 DIAGNOSIS — M79604 Pain in right leg: Secondary | ICD-10-CM | POA: Insufficient documentation

## 2015-12-13 DIAGNOSIS — M7989 Other specified soft tissue disorders: Secondary | ICD-10-CM | POA: Diagnosis not present

## 2015-12-13 DIAGNOSIS — Z720 Tobacco use: Secondary | ICD-10-CM | POA: Insufficient documentation

## 2016-01-28 ENCOUNTER — Telehealth: Payer: Self-pay | Admitting: Rheumatology

## 2016-01-28 ENCOUNTER — Telehealth (INDEPENDENT_AMBULATORY_CARE_PROVIDER_SITE_OTHER): Payer: Self-pay | Admitting: Radiology

## 2016-01-28 MED ORDER — TRAMADOL HCL 50 MG PO TABS: 50.0000 mg | ORAL_TABLET | Freq: Three times a day (TID) | ORAL | 1 refills | Status: DC | PRN

## 2016-01-28 NOTE — Telephone Encounter (Signed)
Pt left message requesting refill of Tramadol.   [facility] in [place]. [phone]

## 2016-01-28 NOTE — Telephone Encounter (Signed)
10/17/15 last visit [facility] 08/22/15 next visit in Epic narcotic agreement on file 11/01/15 Mr Jeff Russell approved this on Friday Have called it in

## 2016-01-28 NOTE — Telephone Encounter (Signed)
Pt called and is asking for a refill of tramadol, he needs it soon as he is going to be leaving for the beach, please call him to advise on refill, [phone]- [facility].

## 2016-02-12 ENCOUNTER — Encounter: Payer: Self-pay | Admitting: Rheumatology

## 2016-02-12 ENCOUNTER — Ambulatory Visit (INDEPENDENT_AMBULATORY_CARE_PROVIDER_SITE_OTHER): Payer: BLUE CROSS/BLUE SHIELD | Admitting: Rheumatology

## 2016-02-12 ENCOUNTER — Encounter: Payer: Self-pay | Admitting: Gastroenterology

## 2016-02-12 VITALS — BP 101/70 | HR 85 | Ht 74.0 in | Wt 276.0 lb

## 2016-02-12 DIAGNOSIS — M4850XD Collapsed vertebra, not elsewhere classified, site unspecified, subsequent encounter for fracture with routine healing: Secondary | ICD-10-CM | POA: Diagnosis not present

## 2016-02-12 DIAGNOSIS — M0579 Rheumatoid arthritis with rheumatoid factor of multiple sites without organ or systems involvement: Secondary | ICD-10-CM | POA: Diagnosis not present

## 2016-02-12 DIAGNOSIS — Z79899 Other long term (current) drug therapy: Secondary | ICD-10-CM | POA: Diagnosis not present

## 2016-02-12 DIAGNOSIS — M722 Plantar fascial fibromatosis: Secondary | ICD-10-CM | POA: Diagnosis not present

## 2016-02-12 DIAGNOSIS — R911 Solitary pulmonary nodule: Secondary | ICD-10-CM

## 2016-02-12 DIAGNOSIS — M8580 Other specified disorders of bone density and structure, unspecified site: Secondary | ICD-10-CM | POA: Diagnosis not present

## 2016-02-12 DIAGNOSIS — IMO0001 Reserved for inherently not codable concepts without codable children: Secondary | ICD-10-CM

## 2016-02-12 DIAGNOSIS — M47816 Spondylosis without myelopathy or radiculopathy, lumbar region: Secondary | ICD-10-CM

## 2016-02-12 DIAGNOSIS — F17200 Nicotine dependence, unspecified, uncomplicated: Secondary | ICD-10-CM

## 2016-02-12 DIAGNOSIS — M170 Bilateral primary osteoarthritis of knee: Secondary | ICD-10-CM | POA: Diagnosis not present

## 2016-02-12 DIAGNOSIS — M2240 Chondromalacia patellae, unspecified knee: Secondary | ICD-10-CM

## 2016-02-12 DIAGNOSIS — Z8614 Personal history of Methicillin resistant Staphylococcus aureus infection: Secondary | ICD-10-CM | POA: Diagnosis not present

## 2016-02-12 LAB — COMPLETE METABOLIC PANEL WITH GFR
ALT: 27 U/L (ref 9–46)
AST: 39 U/L — ABNORMAL HIGH (ref 10–35)
Albumin: 4.2 g/dL (ref 3.6–5.1)
Alkaline Phosphatase: 93 U/L (ref 40–115)
BUN: 8 mg/dL (ref 7–25)
CO2: 28 mmol/L (ref 20–31)
Calcium: 9.4 mg/dL (ref 8.6–10.3)
Chloride: 105 mmol/L (ref 98–110)
Creat: 0.97 mg/dL (ref 0.70–1.33)
GFR, Est African American: >89 mL/min (ref >=60)
GLUCOSE: 67 mg/dL (ref 65–99)
POTASSIUM: 4.7 mmol/L (ref 3.5–5.3)
SODIUM: 139 mmol/L (ref 135–146)
Total Bilirubin: 0.3 mg/dL (ref 0.2–1.2)
Total Protein: 6.9 g/dL (ref 6.1–8.1)

## 2016-02-12 LAB — CBC WITH DIFFERENTIAL/PLATELET
BASOS ABS: 65 {cells}/uL (ref 0–200)
Basophils Relative: 1 %
Eosinophils Absolute: 195 cells/uL (ref 15–500)
Eosinophils Relative: 3 %
HCT: 40.1 % (ref 38.5–50.0)
Hemoglobin: 13.4 g/dL (ref 13.2–17.1)
Lymphocytes Relative: 44 %
Lymphs Abs: 2860 cells/uL (ref 850–3900)
MCH: 32.3 pg (ref 27.0–33.0)
MCHC: 33.4 g/dL (ref 32.0–36.0)
MCV: 96.6 fL (ref 80.0–100.0)
MONOS PCT: 9 %
MPV: 9.9 fL (ref 7.5–12.5)
Monocytes Absolute: 585 cells/uL (ref 200–950)
NEUTROS PCT: 43 %
Neutro Abs: 2795 cells/uL (ref 1500–7800)
PLATELETS: 238 10*3/uL (ref 140–400)
RBC: 4.15 MIL/uL — ABNORMAL LOW (ref 4.20–5.80)
RDW: 13.9 % (ref 11.0–15.0)
WBC: 6.5 10*3/uL (ref 3.8–10.8)

## 2016-02-12 MED ORDER — LIDOCAINE HCL 1 % IJ SOLN
0.5000 mL | INTRAMUSCULAR | Status: AC | PRN
  Administered 2016-02-12: .5 mL

## 2016-02-12 MED ORDER — HYDROCODONE-ACETAMINOPHEN 5-325 MG PO TABS: 1.0000 | ORAL_TABLET | Freq: Every day | ORAL | 0 refills | Status: DC

## 2016-02-12 MED ORDER — TRIAMCINOLONE ACETONIDE 40 MG/ML IJ SUSP
10.0000 mg | INTRAMUSCULAR | Status: AC | PRN
  Administered 2016-02-12: 10 mg

## 2016-02-12 NOTE — Patient Instructions (Addendum)
Standing Labs We placed an order today for your standing lab work.    Please come back and get your standing labs in February and every 3 months  We have open lab Monday through Friday from 8:30-11:30 AM and 1-4 PM at the office of Dr. Tresa Moore, PA.   The office is located at [address] [place], [place], [place] [address] No appointment is necessary.   Labs are drawn by [facility].  You may receive a bill from [facility] for your lab work.     You may discontinue sulfasalazine. Please follow-up with your pulmonologist.  Generic Knee Exercises EXERCISES RANGE OF MOTION (ROM) AND STRETCHING EXERCISES These exercises may help you when beginning to rehabilitate your injury. Your symptoms may resolve with or without further involvement from your physician, physical therapist, or athletic trainer. While completing these exercises, remember:   Restoring tissue flexibility helps normal motion to return to the joints. This allows healthier, less painful movement and activity.  An effective stretch should be held for at least 30 seconds.  A stretch should never be painful. You should only feel a gentle lengthening or release in the stretched tissue. STRETCH - Knee Extension, Prone  Lie on your stomach on a firm surface, such as a bed or countertop. Place your right / left knee and leg just beyond the edge of the surface. You may wish to place a towel under the far end of your right / left thigh for comfort.  Relax your leg muscles and allow gravity to straighten your knee. Your clinician may advise you to add an ankle weight if more resistance is helpful for you.  You should feel a stretch in the back of your right / left knee. Hold this position for __________ seconds. Repeat __________ times. Complete this stretch __________ times per day. * Your physician, physical therapist, or athletic trainer may ask you to add ankle weight to enhance your stretch.  RANGE OF MOTION  - Knee Flexion, Active  Lie on your back with both knees straight. (If this causes back discomfort, bend your opposite knee, placing your foot flat on the floor.)  Slowly slide your heel back toward your buttocks until you feel a gentle stretch in the front of your knee or thigh.  Hold for __________ seconds. Slowly slide your heel back to the starting position. Repeat __________ times. Complete this exercise __________ times per day.  STRETCH - Quadriceps, Prone   Lie on your stomach on a firm surface, such as a bed or padded floor.  Bend your right / left knee and grasp your ankle. If you are unable to reach your ankle or pant leg, use a belt around your foot to lengthen your reach.  Gently pull your heel toward your buttocks. Your knee should not slide out to the side. You should feel a stretch in the front of your thigh and/or knee.  Hold this position for __________ seconds. Repeat __________ times. Complete this stretch __________ times per day.  STRETCH - Hamstrings, Supine   Lie on your back. Loop a belt or towel over the ball of your right / left foot.  Straighten your right / left knee and slowly pull on the belt to raise your leg. Do not allow the right / left knee to bend. Keep your opposite leg flat on the floor.  Raise the leg until you feel a gentle stretch behind your right / left knee or thigh. Hold this position for __________ seconds.  Repeat __________ times. Complete this stretch __________ times per day.  STRENGTHENING EXERCISES These exercises may help you when beginning to rehabilitate your injury. They may resolve your symptoms with or without further involvement from your physician, physical therapist, or athletic trainer. While completing these exercises, remember:   Muscles can gain both the endurance and the strength needed for everyday activities through controlled exercises.  Complete these exercises as instructed by your physician, physical therapist, or  athletic trainer. Progress the resistance and repetitions only as guided.  You may experience muscle soreness or fatigue, but the pain or discomfort you are trying to eliminate should never worsen during these exercises. If this pain does worsen, stop and make certain you are following the directions exactly. If the pain is Croker present after adjustments, discontinue the exercise until you can discuss the trouble with your clinician. STRENGTH - Quadriceps, Isometrics  Lie on your back with your right / left leg extended and your opposite knee bent.  Gradually tense the muscles in the front of your right / left thigh. You should see either your knee cap slide up toward your hip or increased dimpling just above the knee. This motion will push the back of the knee down toward the floor/mat/bed on which you are lying.  Hold the muscle as tight as you can without increasing your pain for __________ seconds.  Relax the muscles slowly and completely in between each repetition. Repeat __________ times. Complete this exercise __________ times per day.  STRENGTH - Quadriceps, Short Arcs   Lie on your back. Place a __________ inch towel roll under your knee so that the knee slightly bends.  Raise only your lower leg by tightening the muscles in the front of your thigh. Do not allow your thigh to rise.  Hold this position for __________ seconds. Repeat __________ times. Complete this exercise __________ times per day.  OPTIONAL ANKLE WEIGHTS: Begin with ____________________, but DO NOT exceed ____________________. Increase in 1 pound/0.5 kilogram increments.  STRENGTH - Quadriceps, Straight Leg Raises  Quality counts! Watch for signs that the quadriceps muscle is working to insure you are strengthening the correct muscles and not "cheating" by substituting with healthier muscles.  Lay on your back with your right / left leg extended and your opposite knee bent.  Tense the muscles in the front of your  right / left thigh. You should see either your knee cap slide up or increased dimpling just above the knee. Your thigh may even quiver.  Tighten these muscles even more and raise your leg 4 to 6 inches off the floor. Hold for __________ seconds.  Keeping these muscles tense, lower your leg.  Relax the muscles slowly and completely in between each repetition. Repeat __________ times. Complete this exercise __________ times per day.  STRENGTH - Hamstring, Curls  Lay on your stomach with your legs extended. (If you lay on a bed, your feet may hang over the edge.)  Tighten the muscles in the back of your thigh to bend your right / left knee up to 90 degrees. Keep your hips flat on the bed/floor.  Hold this position for __________ seconds.  Slowly lower your leg back to the starting position. Repeat __________ times. Complete this exercise __________ times per day.  OPTIONAL ANKLE WEIGHTS: Begin with ____________________, but DO NOT exceed ____________________. Increase in 1 pound/0.5 kilogram increments.  STRENGTH - Quadriceps, Squats  Stand in a door frame so that your feet and knees are in line with the  frame.  Use your hands for balance, not support, on the frame.  Slowly lower your weight, bending at the hips and knees. Keep your lower legs upright so that they are parallel with the door frame. Squat only within the range that does not increase your knee pain. Never let your hips drop below your knees.  Slowly return upright, pushing with your legs, not pulling with your hands. Repeat __________ times. Complete this exercise __________ times per day.  STRENGTH - Quadriceps, Wall Slides  Follow guidelines for form closely. Increased knee pain often results from poorly placed feet or knees.  Lean against a smooth wall or door and walk your feet out 18-24 inches. Place your feet hip-width apart.  Slowly slide down the wall or door until your knees bend __________ degrees.* Keep your  knees over your heels, not your toes, and in line with your hips, not falling to either side.  Hold for __________ seconds. Stand up to rest for __________ seconds in between each repetition. Repeat __________ times. Complete this exercise __________ times per day. * Your physician, physical therapist, or athletic trainer will alter this angle based on your symptoms and progress.   This information is not intended to replace advice given to you by your health care provider. Make sure you discuss any questions you have with your health care provider.   Document Released: 02/05/2005 Document Revised: 04/14/2014 Document Reviewed: 07/06/2008 Elsevier Interactive Patient Education [employer].

## 2016-02-12 NOTE — Progress Notes (Signed)
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 male with history of sero positive rheumatoid arthritis. He states his left heel has been hurting for the last 1 week to the point he is having difficulty walking at times. He had right knee joint arthroscopic surgery by Dr. French Ana about a month ago. His right knee joint is doing better. None of the other joints are swollen or painful . He reports weakness in his bilateral knee joints. No joint swelling. Patient states that during his last visit with Dr. French Ana he noticed some discomfort and pain in his right second MCP joint. Dr. French Ana give him a cortisone injection. The joint is doing better now.   Activities of Daily Living:  Patient reports morning stiffness for 30 minutes.   Patient Denies nocturnal pain.  Difficulty dressing/grooming: Denies Difficulty climbing stairs: Denies Difficulty getting out of chair: Denies Difficulty using hands for taps, buttons, cutlery, and/or writing: Denies   Review of Systems  Constitutional: Positive for fatigue and weakness ( ). Negative for night sweats.  HENT: Negative for mouth sores, mouth dryness and nose dryness.   Eyes: Negative for redness and dryness.  Respiratory: Negative for shortness of breath and difficulty breathing.   Cardiovascular: Negative for chest pain, palpitations, hypertension, irregular heartbeat and swelling in legs/feet.  Gastrointestinal: Negative for constipation and diarrhea.  Endocrine: Negative for increased urination.  Musculoskeletal: Positive for arthralgias, joint pain and morning stiffness. Negative for joint swelling, myalgias, muscle weakness, muscle tenderness and myalgias.  Skin:  Negative for color change, rash, hair loss, nodules/bumps, skin tightness, ulcers and sensitivity to sunlight.  Allergic/Immunologic: Positive for susceptible to infections.  Neurological: Negative for dizziness, fainting, memory loss and night sweats.  Hematological: Negative for swollen glands.  Psychiatric/Behavioral: Negative for depressed mood and sleep disturbance. The patient is not nervous/anxious.     PMFS History:  Patient Active Problem List   Diagnosis Date Noted  . Multiple pulmonary nodules 11/21/2015  . Multiple environmental allergies 05/16/2014  . Cigarette smoker 05/16/2014  . Rheumatoid arthritis ([place]) 05/01/2011    Past Medical History:  Diagnosis Date  . Arthritis   . GERD (gastroesophageal reflux disease)   . History of MRSA infection    right leg per patient  . MSSA (methicillin susceptible Staphylococcus aureus) infection 05/01/2011  . Pneumonia   . Rheumatoid nodules ([place]) 08/06/2014   right lung-per CT scan ordered by Dr Estanislado Pandy  . Septic bursitis 05/01/2011  . Shortness of breath     Family History  Problem Relation Age of Onset  . Arthritis Maternal Grandmother   . Angina Maternal Grandmother   . Colon cancer Paternal Grandfather     deceased  . Prostate cancer Father   . Hypercholesterolemia Mother   . Heart disease Mother     palpitations, ?heart failure  . Hypertension Mother   . Other Mother     Heart enlargement,left ventricle problems  . Congestive Heart Failure Paternal Grandmother    Past Surgical History:  Procedure Laterality Date  . carpel tunnel    .  CHOLECYSTECTOMY    . EPIDURAL BLOCK INJECTION    . I&D EXTREMITY  03/24/2011   Procedure: IRRIGATION AND DEBRIDEMENT EXTREMITY;  Surgeon: Yvette Rack., MD;  Location: [facility];  Service: Orthopedics;  Laterality: Right;  Irrigation and debridement right knee abcess and Bursa,  application of wound vac  . I&D EXTREMITY  03/28/2011   Procedure: IRRIGATION AND DEBRIDEMENT EXTREMITY;   Surgeon: Yvette Rack., MD;  Location: [facility];  Service: Orthopedics;  Laterality: Right;  I&D right knee with wound closure  . MENISCUS REPAIR Right 2017  . TONSILLECTOMY     Social History   Social History Narrative   Work or School: Art gallery manager Situation: lives with wife whom sees me as well       Spiritual Beliefs: believes in God; does not go to church      Lifestyle: no CV exercise - but physically active at work; diet is poor           Objective: Vital Signs: BP 101/70 (BP Location: Left Arm, Patient Position: Sitting, Cuff Size: Large)   Pulse 85   Ht 6\' 2"  (1.88 m)   Wt 276 lb (125.2 kg)   BMI 35.44 kg/m    Physical Exam  Constitutional: He is oriented to person, place, and time. He appears well-developed and well-nourished.  HENT:  Head: Normocephalic and atraumatic.  Eyes: Conjunctivae and EOM are normal. Pupils are equal, round, and reactive to light.  Neck: Normal range of motion. Neck supple.  Cardiovascular: Normal rate, regular rhythm and normal heart sounds.   Pulmonary/Chest: Effort normal and breath sounds normal.  Abdominal: Soft. Bowel sounds are normal.  Neurological: He is alert and oriented to person, place, and time.  Skin: Skin is warm and dry. Capillary refill takes less than 2 seconds.  Psychiatric: He has a normal mood and affect. His behavior is normal.  Nursing note and vitals reviewed.    Musculoskeletal Exam: C-spine, thoracic spine, lumbar spine good range of motion. Shoulder joints, elbow joints, wrist joints with good range of motion. He had synovial thickening over bilateral MCP joints and PIP joints with no synovitis. Bilateral hip joints, bilateral knee joints bilateral ankle joints are good range of motion. He had no synovitis or MTP joints. He had tenderness on palpation over his left heel at the insertion of plantar fascia.  CDAI Exam: CDAI Homunculus Exam:   Joint Counts:  CDAI Tender Joint count: 0 CDAI Swollen  Joint count: 0  Global Assessments:  Patient Global Assessment: 1 Provider Global Assessment: 1  CDAI Calculated Score: 2    Investigation: Findings:  11/08/2015 DEXA showed T score of -0.7 in right femur, TB gold November 2016 negative chest x-ray June 2012 normal 12/04/2015 CMP normal, CBC normal    Imaging: No results found.  Speciality Comments: No specialty comments available.    Procedures:  Plantar Fasciitis Date/Time: 02/12/2016 12:45 PM Performed by: Eliezer Lofts Authorized by: [name]   Consent Given by:  Patient Site marked: the procedure site was marked   Timeout: prior to procedure the correct patient, procedure, and site was verified   Indications:  Fasciitis and pain Condition: Plantar Fasciitis   Location: left plantar fascia muscle   Prep: patient was prepped and draped in usual sterile fashion   Needle Size:  27 G Approach:  Dorsal Medications:  0.5 mL lidocaine 1 %; 10 mg triamcinolone acetonide 40 MG/ML Patient Tolerance:  Patient tolerated  the procedure well with no immediate complications    Allergies: Doxycycline and Humira [adalimumab]   Assessment / Plan: Visit Diagnoses:   Rheumatoid arthritis involving multiple sites with positive rheumatoid factor (HCC) - Positive rheumatoid factor, negative CCP, erosive disease. He continues to have some arthralgias but no synovitis on examination. He seems to be doing quite well on the current medication regimen.  High risk medication use: Xeljanz11mg , SSZ. His labs done in August 2017 were normal. As clinically he is doing well we discussed coming off sulfasalazine and continue with Morrie Sheldon therapy. I will check his CBC and comprehensive metabolic panel today. Than his labs will be due every 3 months. I will check his TB gold today.  Left heel pain: Consistent with plantar fasciitis . After informed consent was obtained left heel was injected with cortisone. Precautions  discussed.  Pulmonary nodule: He is been followed by Dr. Melvyn Novas.  Osteoarthritis of lumbar spine, unspecified spinal osteoarthritis complication status: Chronic discomfort  Pain management: He does take hydrocodone 08/08/2023 every night for chronic pain due to severe osteoarthritis. We will refill his prescription today although I had detailed discussion regarding taking it only on when necessary basis and hopefully cutting down the dose as his arthritis is improving.  Primary osteoarthritis of both knees: Doing better after arthroscopic surgery on right knee joint.  Chondromalacia of patella, unspecified laterality : Minimal discomfort  Compression fracture of vertebra with routine healing, subsequent encounter - T10 and T11, the most recent bone density showed only mild osteopenia. Use of calcium and vitamin D and resistive exercises were discussed. Is smoking cessation was discussed.  Hx MRSA infection: Advised to follow up closely with PCP for any recurrence of infection.  Smoker: Smoking cessation discussed Osteopenia, unspecified location    Orders: Orders Placed This Encounter  Procedures  . Foot Injection  . CBC with Differential/Platelet  . COMPLETE METABOLIC PANEL WITH GFR  . Pain Mgmt, Profile 5 w/Conf, U  . Quantiferon tb gold assay (blood)   Meds ordered this encounter  Medications  . HYDROcodone-acetaminophen (NORCO/VICODIN) 5-325 MG tablet    Sig: Take 1 tablet by mouth at bedtime.    Dispense:  30 tablet    Refill:  0    Face-to-face time spent with patient was 35 minutes. 50% of time was spent in counseling and coordination of care.  Follow-Up Instructions: Return in about 5 years (around 02/11/2021).      [name], MD, Julious Payer  [name], MD, Julious Payer

## 2016-02-13 LAB — PAIN MGMT, PROFILE 5 W/CONF, U
AMPHETAMINES: NEGATIVE ng/mL (ref <500)
Barbiturates: NEGATIVE ng/mL (ref <300)
Benzodiazepines: NEGATIVE ng/mL (ref <100)
Cocaine Metabolite: NEGATIVE ng/mL (ref <150)
Creatinine: 48.2 mg/dL (ref >=20.0)
METHADONE METABOLITE: NEGATIVE ng/mL (ref <100)
Marijuana Metabolite: NEGATIVE ng/mL (ref <20)
OXYCODONE: NEGATIVE ng/mL (ref <100)
Opiates: NEGATIVE ng/mL (ref <100)
Oxidant: NEGATIVE ug/mL (ref <200)
PH: 6.27 (ref 4.5–9.0)

## 2016-02-14 LAB — QUANTIFERON TB GOLD ASSAY (BLOOD)
INTERFERON GAMMA RELEASE ASSAY: NEGATIVE
Mitogen-Nil: >10 IU/mL
Quantiferon Nil Value: 0.06 IU/mL
Quantiferon Tb Ag Minus Nil Value: <0 IU/mL

## 2016-02-15 ENCOUNTER — Telehealth: Payer: Self-pay | Admitting: Radiology

## 2016-02-15 NOTE — Progress Notes (Signed)
May consider decreasing HDN dose as he is using only prn.

## 2016-02-15 NOTE — Telephone Encounter (Signed)
-----   Message from [name], MD sent at 02/15/2016 12:36 PM EST ----- May consider decreasing HDN dose as he is using only prn.

## 2016-02-15 NOTE — Telephone Encounter (Signed)
I have called patient to advise labs are normal / his hydrocodone level is inconsistent with hydrocodone use, has had this result with other UDS in the past as well. Advised his wife normal blood tests but to have him call me back about his Urine test.

## 2016-02-18 ENCOUNTER — Telehealth: Payer: Self-pay | Admitting: Rheumatology

## 2016-02-18 NOTE — Telephone Encounter (Signed)
error 

## 2016-02-18 NOTE — Telephone Encounter (Signed)
Patient called me back about his UDS and has advised me he uses his Hydrocodone one at bedtime, every night. To you FYI

## 2016-02-21 ENCOUNTER — Other Ambulatory Visit: Payer: Self-pay | Admitting: Rheumatology

## 2016-02-22 NOTE — Telephone Encounter (Signed)
OK 

## 2016-02-22 NOTE — Telephone Encounter (Signed)
Last Visit: 02/12/16 Next Visit: 07/14/16 Labs: 02/12/16 WNL  Okay to refill Morrie Sheldon?

## 2016-02-26 ENCOUNTER — Ambulatory Visit (INDEPENDENT_AMBULATORY_CARE_PROVIDER_SITE_OTHER): Payer: BLUE CROSS/BLUE SHIELD | Admitting: Internal Medicine

## 2016-02-26 ENCOUNTER — Encounter: Payer: Self-pay | Admitting: Internal Medicine

## 2016-02-26 ENCOUNTER — Other Ambulatory Visit: Payer: Self-pay | Admitting: Internal Medicine

## 2016-02-26 VITALS — BP 118/76 | HR 70 | Ht 74.0 in | Wt 269.0 lb

## 2016-02-26 DIAGNOSIS — F17210 Nicotine dependence, cigarettes, uncomplicated: Secondary | ICD-10-CM | POA: Diagnosis not present

## 2016-02-26 DIAGNOSIS — Z23 Encounter for immunization: Secondary | ICD-10-CM

## 2016-02-26 DIAGNOSIS — R0600 Dyspnea, unspecified: Secondary | ICD-10-CM

## 2016-02-26 DIAGNOSIS — R918 Other nonspecific abnormal finding of lung field: Secondary | ICD-10-CM | POA: Diagnosis not present

## 2016-02-26 DIAGNOSIS — J449 Chronic obstructive pulmonary disease, unspecified: Secondary | ICD-10-CM

## 2016-02-26 LAB — PULMONARY FUNCTION TEST
DL/VA % PRED: 82 %
DL/VA: 4.01 ml/min/mmHg/L
DLCO COR % PRED: 70 %
DLCO COR: 26.45 ml/min/mmHg
DLCO unc % pred: 64 %
DLCO unc: 24.28 ml/min/mmHg
FEF 25-75 POST: 2.65 L/s
FEF 25-75 Pre: 2.13 L/sec
FEF2575-%CHANGE-POST: 24 %
FEF2575-%PRED-POST: 68 %
FEF2575-%PRED-PRE: 55 %
FEV1-%CHANGE-POST: 4 %
FEV1-%Pred-Post: 70 %
FEV1-%Pred-Pre: 67 %
FEV1-POST: 3.13 L
FEV1-Pre: 3 L
FEV1FVC-%CHANGE-POST: 6 %
FEV1FVC-%PRED-PRE: 92 %
FEV6-%Change-Post: 0 %
FEV6-%Pred-Post: 72 %
FEV6-%Pred-Pre: 73 %
FEV6-Post: 4.05 L
FEV6-Pre: 4.08 L
FEV6FVC-%Change-Post: 1 %
FEV6FVC-%Pred-Post: 102 %
FEV6FVC-%Pred-Pre: 101 %
FVC-%Change-Post: -1 %
FVC-%PRED-POST: 70 %
FVC-%Pred-Pre: 72 %
FVC-POST: 4.07 L
FVC-PRE: 4.14 L
POST FEV1/FVC RATIO: 77 %
PRE FEV1/FVC RATIO: 73 %
PRE FEV6/FVC RATIO: 99 %
Post FEV6/FVC ratio: 100 %
RV % pred: 127 %
RV: 2.88 L
TLC % PRED: 96 %
TLC: 7.53 L

## 2016-02-26 NOTE — Progress Notes (Signed)
Subjective:    Patient ID: Jeff Russell, male    DOB: [birth year],    MRN: [id number]    Brief patient profile:   49 yowm active smoker with RA referred to pulmonary clinic 11/20/2015 by Dr Corena Pilgrim  for [place] with GOLD 0 criteria for copd 02/26/2016    History of Present Illness  11/20/2015 1st [facility] office visit/ Jeff Russell   Chief Complaint  Patient presents with  . Advice Only    Referred by [name] for pulm nodules- CT chest in Epic. Pt denies any breathing complaints.     not limited by breathing but by arthritis/ no skin nodules. rec Stop smoking   02/26/2016  f/u ov/Jeff Russell re:  COPD 0  Chief Complaint  Patient presents with  . Follow-up    Review PFT. No new concerns   Not limited by breathing from desired activities    No obvious day to day or daytime variability or assoc excess/ purulent sputum or mucus plugs or hemoptysis or cp or chest tightness, subjective wheeze or overt sinus or hb symptoms. No unusual exp hx or h/o childhood pna/ asthma or knowledge of premature birth.  Sleeping ok without nocturnal  or early am exacerbation  of respiratory  c/o's or need for noct saba. Also denies any obvious fluctuation of symptoms with weather or environmental changes or other aggravating or alleviating factors except as outlined above   Current Medications, Allergies, Complete Past Medical History, Past Surgical History, Family History, and Social History were reviewed in [employer] [employer] record.  ROS  The following are not active complaints unless bolded sore throat, dysphagia, dental problems, itching, sneezing,  nasal congestion or excess/ purulent secretions, ear ache,   fever, chills, sweats, unintended wt loss, classically pleuritic or exertional cp,  orthopnea pnd or leg swelling, presyncope, palpitations, abdominal pain, anorexia, nausea, vomiting, diarrhea  or change in bowel or bladder habits, change in stools or urine, dysuria,hematuria,   rash, arthralgias, visual complaints, headache, numbness, weakness or ataxia or problems with walking or coordination,  change in mood/affect or memory.                 Objective:   Physical Exam  amb wm slt less  gruff voice    02/26/2016     269   11/20/15 259 lb 6.4 oz (117.7 kg)  11/13/15 257 lb 12.8 oz (116.9 kg)  10/02/15 260 lb 9.6 oz (118.2 kg)    Vital signs reviewed - Note on arrival 02 sats  96% on RA   HEENT: nl dentition, turbinates, and oropharynx. Nl external ear canals without cough reflex   NECK :  without JVD/Nodes/TM/ nl carotid upstrokes bilaterally   LUNGS: no acc muscle use,  Nl contour chest which is clear to A and P bilaterally without cough on insp or exp maneuvers   CV:  RRR  no s3 or murmur or increase in P2, no edema   ABD:  soft and nontender with nl inspiratory excursion in the supine position. No bruits or organomegaly, bowel sounds nl  MS:  Nl gait/  no calf tenderness, cyanosis or clubbing Pos RA changes both hands. No extensor nodules   SKIN: warm and dry without lesions    NEURO:  alert, approp, nl sensorium with  no motor deficits    I personally reviewed images and agree with radiology impression as follows:  CT Chest   10/11/15  Vs 08/14/14  1. Again noted bilateral pulmonary nodules  predominantly subpleural location. The nodules are stable in size in appearance from prior exam. Largest nodule in right apex measures 2 cm. Largest nodule in right upper lobe anteriorly measures 1.6 x 1.6 cm. No definite new pulmonary nodules are noted. The nodules are stable in size in appearance from prior exam. This suggests a benign etiology such as rheumatoid nodules, post inflammatory nodules sarcoid, chronic granulomatous disease less likely prior asbestos exposure. Would be atypical for metastatic disease. If there is clinical concern for metastatic disease further correlation with PET scan is again recommended. Follow-up CT scan in 3-6  months is recommended to assure stability. 2. Stable borderline enlarged right hilar lymph node. No mediastinal adenopathy.          Assessment & Plan:

## 2016-02-26 NOTE — Patient Instructions (Addendum)
Flu shot today   The key is to stop smoking completely before smoking completely stops you!   Ok with me to put off the follow up scan for now - we will call you when it's time   Pulmonary follow will be as needed

## 2016-02-26 NOTE — Telephone Encounter (Signed)
ok 

## 2016-02-28 DIAGNOSIS — J449 Chronic obstructive pulmonary disease, unspecified: Secondary | ICD-10-CM | POA: Insufficient documentation

## 2016-02-28 NOTE — Assessment & Plan Note (Signed)
PFT's  02/26/2016  wnl  dlco 64/70c and corrects to 82% so probably ok x for erv = 28% pred  Nothing to offer here but advice to work on quitting smoking and losing wt (see separate a/p)

## 2016-02-28 NOTE — Assessment & Plan Note (Signed)
1st detected 08/08/14 vs prev neg cxr in 03/2011  - no change by CT chest 10/11/15 > rec f/u 6 m as also active smoker > declined 02/26/2016  - placed in reminder file for 10/2016  Most likely these are all benign and related to RA but I rec he at least have a yearly f/u and he did agree to this   Discussed in detail all the  indications, usual  risks and alternatives  relative to the benefits with patient who agrees to proceed with conservative f/u as outlined

## 2016-02-28 NOTE — Assessment & Plan Note (Addendum)
PFTs 02/26/2016 with ERV 28% c/w body habitus  Body mass index is 34.54 kg/m.  No results found for: TSH   Contributing to gerd  Risk/ reduced ERV >> reviewed the need and the process to achieve and maintain neg calorie balance > defer f/u primary care including intermittently monitoring thyroid status

## 2016-03-07 ENCOUNTER — Other Ambulatory Visit: Payer: Self-pay | Admitting: Rheumatology

## 2016-03-07 NOTE — Telephone Encounter (Signed)
Last Visit: 02/12/16 Next Visit: 07/14/16 Labs: 02/12/16 WNL  Okay to refill Cyclobenzaprine?

## 2016-03-10 NOTE — Telephone Encounter (Signed)
Ok to refill per Dr Deveshwar  

## 2016-03-14 ENCOUNTER — Other Ambulatory Visit: Payer: Self-pay | Admitting: Rheumatology

## 2016-03-14 DIAGNOSIS — Z79899 Other long term (current) drug therapy: Secondary | ICD-10-CM

## 2016-03-14 NOTE — Telephone Encounter (Signed)
Patient needs a refill of Hydrocodone

## 2016-03-17 MED ORDER — HYDROCODONE-ACETAMINOPHEN 5-325 MG PO TABS: 1.0000 | ORAL_TABLET | Freq: Every day | ORAL | 0 refills | Status: DC

## 2016-03-17 NOTE — Telephone Encounter (Signed)
Last Visit: 02/12/16 Next Visit: 07/14/16 UDS: 02/12/16 Narc Agreement: 10/29/15  Okay to refill Hydrocodone? 

## 2016-03-17 NOTE — Telephone Encounter (Signed)
Patient in office to pick up prescription.

## 2016-04-04 ENCOUNTER — Other Ambulatory Visit: Payer: Self-pay | Admitting: Rheumatology

## 2016-04-08 NOTE — Telephone Encounter (Signed)
ok 

## 2016-04-08 NOTE — Telephone Encounter (Signed)
Last Visit: 02/12/16 Next Visit: 07/14/16 UDS: 02/12/16 Narc Agreement: 10/29/15 Ok to refill Tramadol?

## 2016-04-16 ENCOUNTER — Telehealth: Payer: Self-pay | Admitting: Rheumatology

## 2016-04-16 DIAGNOSIS — Z79899 Other long term (current) drug therapy: Secondary | ICD-10-CM

## 2016-04-16 MED ORDER — HYDROCODONE-ACETAMINOPHEN 5-325 MG PO TABS: 1.0000 | ORAL_TABLET | Freq: Every day | ORAL | 0 refills | Status: DC

## 2016-04-16 NOTE — Telephone Encounter (Signed)
Patient advised prescription ready for pick up

## 2016-04-16 NOTE — Telephone Encounter (Signed)
Patient  Requesting Rx Hydrocodone.  Please call when ready for pick up

## 2016-04-16 NOTE — Telephone Encounter (Signed)
ok 

## 2016-04-16 NOTE — Telephone Encounter (Signed)
Last Visit: 02/12/16 Next Visit: 07/14/16 UDS: 02/12/16 Narc Agreement: 10/29/15  Okay to refill Hydrocodone? 

## 2016-05-14 ENCOUNTER — Other Ambulatory Visit: Payer: Self-pay | Admitting: Rheumatology

## 2016-05-14 NOTE — Telephone Encounter (Signed)
Last Visit: 02/12/16 Next Visit: 07/14/16  Okay to refill Gabapentin?

## 2016-05-15 ENCOUNTER — Other Ambulatory Visit (INDEPENDENT_AMBULATORY_CARE_PROVIDER_SITE_OTHER): Payer: Self-pay | Admitting: Rheumatology

## 2016-05-15 ENCOUNTER — Telehealth: Payer: Self-pay | Admitting: Radiology

## 2016-05-15 DIAGNOSIS — M545 Low back pain: Secondary | ICD-10-CM | POA: Diagnosis not present

## 2016-05-15 DIAGNOSIS — Z79899 Other long term (current) drug therapy: Secondary | ICD-10-CM

## 2016-05-15 MED ORDER — HYDROCODONE-ACETAMINOPHEN 5-325 MG PO TABS: 1.0000 | ORAL_TABLET | Freq: Every day | ORAL | 0 refills | Status: DC

## 2016-05-15 NOTE — Telephone Encounter (Signed)
Patient picked up the rx today for Hydrocodone, he told me he will come in next week for labs for his RA meds.

## 2016-05-15 NOTE — Telephone Encounter (Signed)
Last Visit: 02/12/16 Next Visit: 07/14/16 UDS: 02/12/16 Narc Agreement: 10/29/15  Okay to refill Hydrocodone? 

## 2016-05-15 NOTE — Telephone Encounter (Signed)
Left message to advised prescription ready for pick up.

## 2016-05-15 NOTE — Telephone Encounter (Signed)
Patient is requesting a refill of hydrocodone. He is hoping to pick it up while he is in the area around 3:45 today   Cb#; [phone]

## 2016-05-20 ENCOUNTER — Other Ambulatory Visit: Payer: Self-pay | Admitting: Rheumatology

## 2016-05-21 NOTE — Telephone Encounter (Signed)
Last Visit: 02/12/16 Next Visit: 07/14/16 Labs: 02/12/16 WNL  Okay to refill Jeff Russell?

## 2016-05-22 DIAGNOSIS — M79672 Pain in left foot: Secondary | ICD-10-CM | POA: Diagnosis not present

## 2016-05-27 ENCOUNTER — Other Ambulatory Visit: Payer: Self-pay | Admitting: *Deleted

## 2016-05-27 DIAGNOSIS — M4316 Spondylolisthesis, lumbar region: Secondary | ICD-10-CM | POA: Diagnosis not present

## 2016-05-27 DIAGNOSIS — Z79899 Other long term (current) drug therapy: Secondary | ICD-10-CM | POA: Diagnosis not present

## 2016-05-27 DIAGNOSIS — M79672 Pain in left foot: Secondary | ICD-10-CM | POA: Diagnosis not present

## 2016-05-27 DIAGNOSIS — R29898 Other symptoms and signs involving the musculoskeletal system: Secondary | ICD-10-CM | POA: Diagnosis not present

## 2016-05-27 DIAGNOSIS — Z6832 Body mass index (BMI) 32.0-32.9, adult: Secondary | ICD-10-CM | POA: Diagnosis not present

## 2016-05-27 LAB — CBC WITH DIFFERENTIAL/PLATELET
BASOS PCT: 1 %
Basophils Absolute: 74 cells/uL (ref 0–200)
EOS PCT: 3 %
Eosinophils Absolute: 222 cells/uL (ref 15–500)
HCT: 42.4 % (ref 38.5–50.0)
Hemoglobin: 14 g/dL (ref 13.2–17.1)
Lymphocytes Relative: 31 %
Lymphs Abs: 2294 cells/uL (ref 850–3900)
MCH: 32 pg (ref 27.0–33.0)
MCHC: 33 g/dL (ref 32.0–36.0)
MCV: 97 fL (ref 80.0–100.0)
MONOS PCT: 8 %
MPV: 9.6 fL (ref 7.5–12.5)
Monocytes Absolute: 592 cells/uL (ref 200–950)
NEUTROS ABS: 4218 {cells}/uL (ref 1500–7800)
Neutrophils Relative %: 57 %
PLATELETS: 239 10*3/uL (ref 140–400)
RBC: 4.37 MIL/uL (ref 4.20–5.80)
RDW: 13.2 % (ref 11.0–15.0)
WBC: 7.4 10*3/uL (ref 3.8–10.8)

## 2016-05-27 LAB — COMPLETE METABOLIC PANEL WITH GFR
ALT: 23 U/L (ref 9–46)
AST: 27 U/L (ref 10–35)
Albumin: 4.1 g/dL (ref 3.6–5.1)
Alkaline Phosphatase: 105 U/L (ref 40–115)
BILIRUBIN TOTAL: 0.4 mg/dL (ref 0.2–1.2)
BUN: 11 mg/dL (ref 7–25)
CHLORIDE: 105 mmol/L (ref 98–110)
CO2: 26 mmol/L (ref 20–31)
Calcium: 9.6 mg/dL (ref 8.6–10.3)
Creat: 0.97 mg/dL (ref 0.70–1.33)
GFR, Est African American: >89 mL/min (ref >=60)
GLUCOSE: 83 mg/dL (ref 65–99)
Potassium: 4.3 mmol/L (ref 3.5–5.3)
SODIUM: 139 mmol/L (ref 135–146)
TOTAL PROTEIN: 6.7 g/dL (ref 6.1–8.1)

## 2016-06-02 ENCOUNTER — Other Ambulatory Visit: Payer: Self-pay | Admitting: *Deleted

## 2016-06-02 MED ORDER — CYCLOBENZAPRINE HCL 10 MG PO TABS: ORAL_TABLET | ORAL | 0 refills | Status: DC

## 2016-06-02 NOTE — Telephone Encounter (Signed)
Refill request received via fax for Cyclobenzaprine  Last Visit: 02/12/16 Next Visit: 07/14/16  Okay to refill Cyclobenzaprine?

## 2016-06-02 NOTE — Telephone Encounter (Signed)
ok 

## 2016-06-10 DIAGNOSIS — R29898 Other symptoms and signs involving the musculoskeletal system: Secondary | ICD-10-CM | POA: Diagnosis not present

## 2016-06-10 DIAGNOSIS — M48062 Spinal stenosis, lumbar region with neurogenic claudication: Secondary | ICD-10-CM | POA: Diagnosis not present

## 2016-06-10 DIAGNOSIS — R202 Paresthesia of skin: Secondary | ICD-10-CM | POA: Diagnosis not present

## 2016-06-12 DIAGNOSIS — M4316 Spondylolisthesis, lumbar region: Secondary | ICD-10-CM | POA: Diagnosis not present

## 2016-06-12 DIAGNOSIS — S83207A Unspecified tear of unspecified meniscus, current injury, left knee, initial encounter: Secondary | ICD-10-CM | POA: Diagnosis not present

## 2016-06-12 DIAGNOSIS — R202 Paresthesia of skin: Secondary | ICD-10-CM | POA: Diagnosis not present

## 2016-06-12 DIAGNOSIS — M48062 Spinal stenosis, lumbar region with neurogenic claudication: Secondary | ICD-10-CM | POA: Diagnosis not present

## 2016-06-20 ENCOUNTER — Other Ambulatory Visit: Payer: Self-pay | Admitting: Rheumatology

## 2016-06-20 DIAGNOSIS — Z79899 Other long term (current) drug therapy: Secondary | ICD-10-CM

## 2016-06-20 MED ORDER — HYDROCODONE-ACETAMINOPHEN 5-325 MG PO TABS: 1.0000 | ORAL_TABLET | Freq: Every day | ORAL | 0 refills | Status: DC

## 2016-06-20 NOTE — Telephone Encounter (Signed)
Left message to advise patient prescription is ready for pick up.

## 2016-06-20 NOTE — Telephone Encounter (Signed)
Patient is requesting refill of hydrocodone.  

## 2016-06-20 NOTE — Telephone Encounter (Signed)
Last Visit: 02/12/16 Next Visit: 07/14/16 UDS: 02/12/16 Narc Agreement: 10/29/15  Okay to refill Hydrocodone?

## 2016-06-20 NOTE — Telephone Encounter (Signed)
Have him sign narcotic agreement when he comes to pick up the rx. Ok to fill.

## 2016-06-30 DIAGNOSIS — M48062 Spinal stenosis, lumbar region with neurogenic claudication: Secondary | ICD-10-CM | POA: Diagnosis not present

## 2016-06-30 DIAGNOSIS — M4316 Spondylolisthesis, lumbar region: Secondary | ICD-10-CM | POA: Diagnosis not present

## 2016-07-02 ENCOUNTER — Other Ambulatory Visit: Payer: Self-pay | Admitting: Neurosurgery

## 2016-07-10 NOTE — Pre-Procedure Instructions (Addendum)
Juaquin D Keech  07/10/2016      [facility] [place]  - [address] [place] [address] Phone: [phone] Fax: [phone]  [place], [place] [address] [place] [address] Phone: [phone] Fax: [phone]    Your procedure is scheduled on April 11  Report to [facility] at [facility].  Call this number if you have problems the morning of surgery:  [phone]   Remember:  Do not eat food or drink liquids after midnight.   Take these medicines the morning of surgery with A SIP OF WATER cyclobenzaprine (FLEXERIL if needed, esomeprazole (NEXIUM), fluticasone (FLONASE), gabapentin (NEURONTIN) , HYDROcodone-acetaminophen (NORCO/VICODIN),  Take all other medications as prescribed except 7 days prior to surgery STOP taking any Aspirin, Aleve, Naproxen, Ibuprofen, Motrin, Advil, Goody's, BC's, all herbal medications, fish oil, and all vitamins, xeljanz    Do not wear jewelry.  Do not wear lotions, powders, or cologne, or deoderant.  Men may shave face and neck.  Do not bring valuables to the hospital.  [facility] is not responsible for any belongings or valuables.  Contacts, dentures or bridgework may not be worn into surgery.  Leave your suitcase in the car.  After surgery it may be brought to your room.  For patients admitted to the hospital, discharge time will be determined by your treatment team.  Patients discharged the day of surgery will not be allowed to drive home.    Special instructions:   [facility]- Preparing For Surgery  Before surgery, you can play an important role. Because skin is not sterile, your skin needs to be as free of germs as possible. You can reduce the number of germs on your skin by washing with CHG (chlorahexidine gluconate) Soap before surgery.  CHG is an antiseptic cleaner which kills  germs and bonds with the skin to continue killing germs even after washing.  Please do not use if you have an allergy to CHG or antibacterial soaps. If your skin becomes reddened/irritated stop using the CHG.  Do not shave (including legs and underarms) for at least 48 hours prior to first CHG shower. It is OK to shave your face.  Please follow these instructions carefully.   1. Shower the NIGHT BEFORE SURGERY and the MORNING OF SURGERY with CHG.   2. If you chose to wash your hair, wash your hair first as usual with your normal shampoo.  3. After you shampoo, rinse your hair and body thoroughly to remove the shampoo.  4. Use CHG as you would any other liquid soap. You can apply CHG directly to the skin and wash gently with a scrungie or a clean washcloth.   5. Apply the CHG Soap to your body ONLY FROM THE NECK DOWN.  Do not use on open wounds or open sores. Avoid contact with your eyes, ears, mouth and genitals (private parts). Wash genitals (private parts) with your normal soap.  6. Wash thoroughly, paying special attention to the area where your surgery will be performed.  7. Thoroughly rinse your body with warm water from the neck down.  8. DO NOT shower/wash with your normal soap after using and rinsing off the CHG Soap.  9. Pat yourself dry with a CLEAN TOWEL.   10. Wear CLEAN PAJAMAS   11. Place CLEAN SHEETS on your bed the night of your  first shower and DO NOT SLEEP WITH PETS.    Day of Surgery: Do not apply any deodorants/lotions. Please wear clean clothes to the hospital/surgery center.      Please read over the  fact sheets that you were given.

## 2016-07-11 ENCOUNTER — Encounter (HOSPITAL_COMMUNITY): Payer: Self-pay

## 2016-07-11 ENCOUNTER — Encounter (HOSPITAL_COMMUNITY)
Admission: RE | Admit: 2016-07-11 | Discharge: 2016-07-11 | Disposition: A | Discharge status: Home / Self Care | Payer: BLUE CROSS/BLUE SHIELD | Source: Ambulatory Visit | Attending: Neurosurgery | Admitting: Neurosurgery

## 2016-07-11 DIAGNOSIS — Z01812 Encounter for preprocedural laboratory examination: Secondary | ICD-10-CM | POA: Diagnosis not present

## 2016-07-11 DIAGNOSIS — Z01810 Encounter for preprocedural cardiovascular examination: Secondary | ICD-10-CM | POA: Insufficient documentation

## 2016-07-11 DIAGNOSIS — M48061 Spinal stenosis, lumbar region without neurogenic claudication: Secondary | ICD-10-CM | POA: Diagnosis not present

## 2016-07-11 HISTORY — DX: Chronic obstructive pulmonary disease, unspecified: J44.9

## 2016-07-11 LAB — BASIC METABOLIC PANEL
Anion gap: 7 (ref 5–15)
BUN: 8 mg/dL (ref 6–20)
CALCIUM: 9.4 mg/dL (ref 8.9–10.3)
CO2: 25 mmol/L (ref 22–32)
CREATININE: 0.91 mg/dL (ref 0.61–1.24)
Chloride: 106 mmol/L (ref 101–111)
GFR calc Af Amer: >60 mL/min (ref >60)
GLUCOSE: 87 mg/dL (ref 65–99)
POTASSIUM: 4.2 mmol/L (ref 3.5–5.1)
SODIUM: 138 mmol/L (ref 135–145)

## 2016-07-11 LAB — CBC
HEMATOCRIT: 41.7 % (ref 39.0–52.0)
Hemoglobin: 13.4 g/dL (ref 13.0–17.0)
MCH: 31.5 pg (ref 26.0–34.0)
MCHC: 32.1 g/dL (ref 30.0–36.0)
MCV: 97.9 fL (ref 78.0–100.0)
PLATELETS: 243 10*3/uL (ref 150–400)
RBC: 4.26 MIL/uL (ref 4.22–5.81)
RDW: 13.3 % (ref 11.5–15.5)
WBC: 6.9 10*3/uL (ref 4.0–10.5)

## 2016-07-11 LAB — TYPE AND SCREEN
ABO/RH(D): O POS
ANTIBODY SCREEN: NEGATIVE

## 2016-07-11 LAB — SURGICAL PCR SCREEN
MRSA, PCR: NEGATIVE
STAPHYLOCOCCUS AUREUS: POSITIVE — AB

## 2016-07-14 ENCOUNTER — Telehealth: Payer: Self-pay | Admitting: Rheumatology

## 2016-07-14 ENCOUNTER — Ambulatory Visit: Payer: BLUE CROSS/BLUE SHIELD | Admitting: Rheumatology

## 2016-07-14 DIAGNOSIS — M4850XA Collapsed vertebra, not elsewhere classified, site unspecified, initial encounter for fracture: Secondary | ICD-10-CM | POA: Insufficient documentation

## 2016-07-14 DIAGNOSIS — Z8614 Personal history of Methicillin resistant Staphylococcus aureus infection: Secondary | ICD-10-CM | POA: Insufficient documentation

## 2016-07-14 DIAGNOSIS — M8589 Other specified disorders of bone density and structure, multiple sites: Secondary | ICD-10-CM | POA: Insufficient documentation

## 2016-07-14 DIAGNOSIS — M47816 Spondylosis without myelopathy or radiculopathy, lumbar region: Secondary | ICD-10-CM | POA: Insufficient documentation

## 2016-07-14 DIAGNOSIS — Z79899 Other long term (current) drug therapy: Secondary | ICD-10-CM | POA: Insufficient documentation

## 2016-07-14 DIAGNOSIS — M170 Bilateral primary osteoarthritis of knee: Secondary | ICD-10-CM | POA: Insufficient documentation

## 2016-07-14 DIAGNOSIS — M722 Plantar fascial fibromatosis: Secondary | ICD-10-CM | POA: Insufficient documentation

## 2016-07-14 DIAGNOSIS — M2240 Chondromalacia patellae, unspecified knee: Secondary | ICD-10-CM | POA: Insufficient documentation

## 2016-07-14 NOTE — Telephone Encounter (Signed)
FYI, Patient is requesting and it's to early. Patient will be in office to see you tomorrow. Need to discuss with patient tomorrow.

## 2016-07-14 NOTE — Progress Notes (Signed)
Office Visit Note  Patient: Jeff Russell             Date of Birth: 1965/07/31           MRN: [id number]             PCP: Lucretia Kern., DO Referring: Lucretia Kern, DO Visit Date: 07/15/2016 Occupation: @GUAROCC @    Subjective:  Back Pain (sch for surgery tomorrow )   History of Present Illness: Jeff Russell is a 51 y.o. male    RIGHT KNEE SWELLING AFTER WORK X 1 MONTH HX OF RIGHT KNEE SURGERY W/ DR. CAFFREY ([facility] MENISCUS REPAIR). Patient plans to speak with Dr. French Ana as soon as he's done/recovered from his back surgery scheduled tomorrow  RA DOING WELL. XELJANZ HAS WORKED WELL IN THE PAST. HOLDING FROM XELJANZ DUE TO BACK SURGERY TOMORROW W/ DR. CABELL. Patient will ask Dr. Cyndy Freeze when his surgery is healed enough to restart [employer]. Typically about 2 weeks and patient will call our office to let us know what Dr. Cyndy Freeze has recommended.  Patient will also be treated with strong pain medication due to his upcoming back surgery and he was worried that it would interfere with our narcotic agreement. Since we've discussed that and he's been on hold off on all of our narcotic medications and only use the ones recommended by Dr. Cyndy Freeze, I told him that it will not interfere with the narcotic agreement.  As such, he does not need any refills of his narcotic medication at this time because his surgery will be starting tomorrow and he will be getting medications from his surgeon. Patient understands and is agreeable    Activities of Daily Living:  Patient reports morning stiffness for 60 minutes.   Patient Reports nocturnal pain.  Difficulty dressing/grooming: Reports Difficulty climbing stairs: Reports Difficulty getting out of chair: Reports Difficulty using hands for taps, buttons, cutlery, and/or writing: Reports   Review of Systems  Constitutional: Negative for fatigue.  HENT: Negative for mouth sores and mouth dryness.   Eyes: Negative for dryness.    Respiratory: Negative for shortness of breath.   Gastrointestinal: Negative for constipation and diarrhea.  Musculoskeletal: Negative for myalgias and myalgias.  Skin: Negative for sensitivity to sunlight.  Neurological: Negative for memory loss.  Psychiatric/Behavioral: Negative for sleep disturbance.    PMFS History:  Patient Active Problem List   Diagnosis Date Noted  . High risk medication use 07/14/2016  . 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 Primary osteoarthritis of both knees 07/14/2016  . 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 Vertebral compression fracture ([place]) 07/14/2016  . History of MRSA infection 07/14/2016  . Osteopenia of multiple sites 07/14/2016  . Plantar fasciitis of left foot 07/14/2016  . COPD 0 / [place] smoking 02/28/2016  . Morbid obesity due to excess calories ([place]) 02/28/2016  . Multiple pulmonary nodules 11/21/2015  . Multiple environmental allergies 05/16/2014  . Cigarette smoker 05/16/2014  . Rheumatoid arthritis ([place]) 05/01/2011    Past Medical History:  Diagnosis Date  . Arthritis   . COPD (chronic obstructive pulmonary disease) ([place])    per pt  . GERD (gastroesophageal reflux disease)   . History of MRSA infection    right leg per patient  . MSSA (methicillin susceptible Staphylococcus aureus) infection 05/01/2011  . Pneumonia   . Rheumatoid nodules ([place]) 08/06/2014   right lung-per CT scan ordered by Dr Estanislado Pandy  . Septic bursitis 05/01/2011  . Shortness of breath  occ    Family History  Problem Relation Age of Onset  . Arthritis Maternal Grandmother   . Angina Maternal Grandmother   . Colon cancer Paternal Grandfather     deceased  . Prostate cancer Father   . Hypercholesterolemia Mother   . Heart disease Mother     palpitations, ?heart failure  . Hypertension Mother   . Other Mother     Heart enlargement,left ventricle problems  . Congestive Heart Failure Paternal Grandmother    Past Surgical History:  Procedure  Laterality Date  . carpel tunnel Bilateral   . CHOLECYSTECTOMY    . EPIDURAL BLOCK INJECTION    . EYE SURGERY Left    wire in eye  . I&D EXTREMITY  03/24/2011   Procedure: IRRIGATION AND DEBRIDEMENT EXTREMITY;  Surgeon: Yvette Rack., MD;  Location: [facility];  Service: Orthopedics;  Laterality: Right;  Irrigation and debridement right knee abcess and Bursa,  application of wound vac  . I&D EXTREMITY  03/28/2011   Procedure: IRRIGATION AND DEBRIDEMENT EXTREMITY;  Surgeon: Yvette Rack., MD;  Location: [facility];  Service: Orthopedics;  Laterality: Right;  I&D right knee with wound closure  . MENISCUS REPAIR Right 2017  . TONSILLECTOMY     Social History   Social History Narrative   Work or School: Art gallery manager Situation: lives with wife whom sees me as well       Spiritual Beliefs: believes in God; does not go to church      Lifestyle: no CV exercise - but physically active at work; diet is poor           Objective: Vital Signs: BP 138/80   Pulse 88   Resp 18   Ht 6' 3"  (1.905 m)   Wt 277 lb (125.6 kg)   BMI 34.62 kg/m    Physical Exam  Constitutional: He is oriented to person, place, and time. He appears well-developed and well-nourished.  HENT:  Head: Normocephalic and atraumatic.  Eyes: Conjunctivae and EOM are normal. Pupils are equal, round, and reactive to light.  Neck: Normal range of motion. Neck supple.  Cardiovascular: Normal rate, regular rhythm and normal heart sounds.  Exam reveals no gallop and no friction rub.   No murmur heard. Pulmonary/Chest: Effort normal and breath sounds normal. No respiratory distress. He has no wheezes. He has no rales. He exhibits no tenderness.  Abdominal: Soft. He exhibits no distension and no mass. There is no tenderness. There is no guarding.  Musculoskeletal: Normal range of motion.  Lymphadenopathy:    He has no cervical adenopathy.  Neurological: He is alert and oriented to person, place, and time. He exhibits normal  muscle tone. Coordination normal.  Skin: Skin is warm and dry. Capillary refill takes less than 2 seconds. No rash noted.  Psychiatric: He has a normal mood and affect. His behavior is normal. Judgment and thought content normal.  Vitals reviewed.    Musculoskeletal Exam:   Full range of motion of all joints Grip strength is equal and strong bilaterally Fiber myalgia tender points are all absent  CDAI Exam: CDAI Homunculus Exam:   Joint Counts:  CDAI Tender Joint count: 0 CDAI Swollen Joint count: 0  History of synovial thickening of bilateral second and third MCP joint   Investigation: Findings:  11/08/2015 DEXA showed T score of -0.7 in right femur, TB gold November 2016 negative chest x-ray June 2012 normal 12/04/2015 CMP normal, CBC normal  TB Gold Negative-02/15/2016  Hospital Outpatient Visit on 07/11/2016  Component Date Value Ref Range Status  . MRSA, PCR 07/11/2016 NEGATIVE  NEGATIVE Final  . Staphylococcus aureus 07/11/2016 POSITIVE* NEGATIVE Final   Comment:        The Xpert SA Assay (FDA approved for NASAL specimens in patients over 65 years of age), is one component of a comprehensive surveillance program.  Test performance has been validated by [facility] for patients greater than or equal to 86 year old. It is not intended to diagnose infection nor to guide or monitor treatment.   . Sodium 07/11/2016 138  135 - 145 mmol/L Final  . Potassium 07/11/2016 4.2  3.5 - 5.1 mmol/L Final  . Chloride 07/11/2016 106  101 - 111 mmol/L Final  . CO2 07/11/2016 25  22 - 32 mmol/L Final  . Glucose, Bld 07/11/2016 87  65 - 99 mg/dL Final  . BUN 07/11/2016 8  6 - 20 mg/dL Final  . Creatinine, Ser 07/11/2016 0.91  0.61 - 1.24 mg/dL Final  . Calcium 07/11/2016 9.4  8.9 - 10.3 mg/dL Final  . GFR calc non Af Amer 07/11/2016 >60  >60 mL/min Final  . GFR calc Af Amer 07/11/2016 >60  >60 mL/min Final   Comment: (NOTE) The eGFR has been calculated using the CKD EPI  equation. This calculation has not been validated in all clinical situations. eGFR's persistently <60 mL/min signify possible Chronic Kidney Disease.   . Anion gap 07/11/2016 7  5 - 15 Final  . WBC 07/11/2016 6.9  4.0 - 10.5 K/uL Final  . RBC 07/11/2016 4.26  4.22 - 5.81 MIL/uL Final  . Hemoglobin 07/11/2016 13.4  13.0 - 17.0 g/dL Final  . HCT 07/11/2016 41.7  39.0 - 52.0 % Final  . MCV 07/11/2016 97.9  78.0 - 100.0 fL Final  . MCH 07/11/2016 31.5  26.0 - 34.0 pg Final  . MCHC 07/11/2016 32.1  30.0 - 36.0 g/dL Final  . RDW 07/11/2016 13.3  11.5 - 15.5 % Final  . Platelets 07/11/2016 243  150 - 400 K/uL Final  . ABO/RH(D) 07/11/2016 O POS   Final  . Antibody Screen 07/11/2016 NEG   Final  . Sample Expiration 07/11/2016 07/25/2016   Final  . Extend sample reason 07/11/2016 NO TRANSFUSIONS OR PREGNANCY IN THE PAST 3 MONTHS   Final  Orders Only on 05/27/2016  Component Date Value Ref Range Status  . WBC 05/27/2016 7.4  3.8 - 10.8 K/uL Final  . RBC 05/27/2016 4.37  4.20 - 5.80 MIL/uL Final  . Hemoglobin 05/27/2016 14.0  13.2 - 17.1 g/dL Final  . HCT 05/27/2016 42.4  38.5 - 50.0 % Final  . MCV 05/27/2016 97.0  80.0 - 100.0 fL Final  . MCH 05/27/2016 32.0  27.0 - 33.0 pg Final  . MCHC 05/27/2016 33.0  32.0 - 36.0 g/dL Final  . RDW 05/27/2016 13.2  11.0 - 15.0 % Final  . Platelets 05/27/2016 239  140 - 400 K/uL Final  . MPV 05/27/2016 9.6  7.5 - 12.5 fL Final  . Neutro Abs 05/27/2016 4218  1,500 - 7,800 cells/uL Final  . Lymphs Abs 05/27/2016 2294  850 - 3,900 cells/uL Final  . Monocytes Absolute 05/27/2016 592  200 - 950 cells/uL Final  . Eosinophils Absolute 05/27/2016 222  15 - 500 cells/uL Final  . Basophils Absolute 05/27/2016 74  0 - 200 cells/uL Final  . Neutrophils Relative % 05/27/2016 57  % Final  . Lymphocytes  Relative 05/27/2016 31  % Final  . Monocytes Relative 05/27/2016 8  % Final  . Eosinophils Relative 05/27/2016 3  % Final  . Basophils Relative 05/27/2016 1  % Final    . Smear Review 05/27/2016 Criteria for review not met   Final  . Sodium 05/27/2016 139  135 - 146 mmol/L Final  . Potassium 05/27/2016 4.3  3.5 - 5.3 mmol/L Final  . Chloride 05/27/2016 105  98 - 110 mmol/L Final  . CO2 05/27/2016 26  20 - 31 mmol/L Final  . Glucose, Bld 05/27/2016 83  65 - 99 mg/dL Final  . BUN 05/27/2016 11  7 - 25 mg/dL Final  . Creat 05/27/2016 0.97  0.70 - 1.33 mg/dL Final   Comment:   For patients > or = 51 years of age: The upper reference limit for Creatinine is approximately 13% higher for people identified as African-American.     . Total Bilirubin 05/27/2016 0.4  0.2 - 1.2 mg/dL Final  . Alkaline Phosphatase 05/27/2016 105  40 - 115 U/L Final  . AST 05/27/2016 27  10 - 35 U/L Final  . ALT 05/27/2016 23  9 - 46 U/L Final  . Total Protein 05/27/2016 6.7  6.1 - 8.1 g/dL Final  . Albumin 05/27/2016 4.1  3.6 - 5.1 g/dL Final  . Calcium 05/27/2016 9.6  8.6 - 10.3 mg/dL Final  . GFR, Est African American 05/27/2016 >89  >=60 mL/min Final  . GFR, Est Non African American 05/27/2016 >89  >=60 mL/min Final  Appointment on 02/26/2016  Component Date Value Ref Range Status  . FVC-Pre 02/26/2016 4.14  L Final  . FVC-%Pred-Pre 02/26/2016 72  % Final  . FVC-Post 02/26/2016 4.07  L Final  . FVC-%Pred-Post 02/26/2016 70  % Final  . FVC-%Change-Post 02/26/2016 -1  % Final  . FEV1-Pre 02/26/2016 3.00  L Final  . FEV1-%Pred-Pre 02/26/2016 67  % Final  . FEV1-Post 02/26/2016 3.13  L Final  . FEV1-%Pred-Post 02/26/2016 70  % Final  . FEV1-%Change-Post 02/26/2016 4  % Final  . FEV6-Pre 02/26/2016 4.08  L Final  . FEV6-%Pred-Pre 02/26/2016 73  % Final  . FEV6-Post 02/26/2016 4.05  L Final  . FEV6-%Pred-Post 02/26/2016 72  % Final  . FEV6-%Change-Post 02/26/2016 0  % Final  . Pre FEV1/FVC ratio 02/26/2016 73  % Final  . FEV1FVC-%Pred-Pre 02/26/2016 92  % Final  . Post FEV1/FVC ratio 02/26/2016 77  % Final  . FEV1FVC-%Change-Post 02/26/2016 6  % Final  . Pre FEV6/FVC  Ratio 02/26/2016 99  % Final  . FEV6FVC-%Pred-Pre 02/26/2016 101  % Final  . Post FEV6/FVC ratio 02/26/2016 100  % Final  . FEV6FVC-%Pred-Post 02/26/2016 102  % Final  . FEV6FVC-%Change-Post 02/26/2016 1  % Final  . FEF 25-75 Pre 02/26/2016 2.13  L/sec Final  . FEF2575-%Pred-Pre 02/26/2016 55  % Final  . FEF 25-75 Post 02/26/2016 2.65  L/sec Final  . FEF2575-%Pred-Post 02/26/2016 68  % Final  . FEF2575-%Change-Post 02/26/2016 24  % Final  . RV 02/26/2016 2.88  L Final  . RV % pred 02/26/2016 127  % Final  . TLC 02/26/2016 7.53  L Final  . TLC % pred 02/26/2016 96  % Final  . DLCO unc 02/26/2016 24.28  ml/min/mmHg Final  . DLCO unc % pred 02/26/2016 64  % Final  . DLCO cor 02/26/2016 26.45  ml/min/mmHg Final  . DLCO cor % pred 02/26/2016 70  % Final  . DL/VA 02/26/2016 4.01  ml/min/mmHg/L Final  . DL/VA % pred 02/26/2016 82  % Final  Office Visit on 02/12/2016  Component Date Value Ref Range Status  . WBC 02/12/2016 6.5  3.8 - 10.8 K/uL Final  . RBC 02/12/2016 4.15* 4.20 - 5.80 MIL/uL Final  . Hemoglobin 02/12/2016 13.4  13.2 - 17.1 g/dL Final  . HCT 02/12/2016 40.1  38.5 - 50.0 % Final  . MCV 02/12/2016 96.6  80.0 - 100.0 fL Final  . MCH 02/12/2016 32.3  27.0 - 33.0 pg Final  . MCHC 02/12/2016 33.4  32.0 - 36.0 g/dL Final  . RDW 02/12/2016 13.9  11.0 - 15.0 % Final  . Platelets 02/12/2016 238  140 - 400 K/uL Final  . MPV 02/12/2016 9.9  7.5 - 12.5 fL Final  . Neutro Abs 02/12/2016 2795  1,500 - 7,800 cells/uL Final  . Lymphs Abs 02/12/2016 2860  850 - 3,900 cells/uL Final  . Monocytes Absolute 02/12/2016 585  200 - 950 cells/uL Final  . Eosinophils Absolute 02/12/2016 195  15 - 500 cells/uL Final  . Basophils Absolute 02/12/2016 65  0 - 200 cells/uL Final  . Neutrophils Relative % 02/12/2016 43  % Final  . Lymphocytes Relative 02/12/2016 44  % Final  . Monocytes Relative 02/12/2016 9  % Final  . Eosinophils Relative 02/12/2016 3  % Final  . Basophils Relative 02/12/2016 1  %  Final  . Smear Review 02/12/2016 Criteria for review not met   Final  . Sodium 02/12/2016 139  135 - 146 mmol/L Final  . Potassium 02/12/2016 4.7  3.5 - 5.3 mmol/L Final  . Chloride 02/12/2016 105  98 - 110 mmol/L Final  . CO2 02/12/2016 28  20 - 31 mmol/L Final  . Glucose, Bld 02/12/2016 67  65 - 99 mg/dL Final  . BUN 02/12/2016 8  7 - 25 mg/dL Final  . Creat 02/12/2016 0.97  0.70 - 1.33 mg/dL Final   Comment:   For patients > or = 51 years of age: The upper reference limit for Creatinine is approximately 13% higher for people identified as African-American.     . Total Bilirubin 02/12/2016 0.3  0.2 - 1.2 mg/dL Final  . Alkaline Phosphatase 02/12/2016 93  40 - 115 U/L Final  . AST 02/12/2016 39* 10 - 35 U/L Final  . ALT 02/12/2016 27  9 - 46 U/L Final  . Total Protein 02/12/2016 6.9  6.1 - 8.1 g/dL Final  . Albumin 02/12/2016 4.2  3.6 - 5.1 g/dL Final  . Calcium 02/12/2016 9.4  8.6 - 10.3 mg/dL Final  . GFR, Est African American 02/12/2016 >89  >=60 mL/min Final  . GFR, Est Non African American 02/12/2016 >89  >=60 mL/min Final  . Prescribed Drug 1 02/12/2016 Hydrocodone   Final  . Creatinine 02/12/2016 48.2  > or = 20.0 mg/dL Final  . pH 02/12/2016 6.27  4.5 - 9.0 Final  . Oxidant 02/12/2016 NEGATIVE  <200 mcg/mL Final  . Amphetamines 02/12/2016 NEGATIVE  <500 ng/mL Final  . medMATCH Amphetamines 02/12/2016 CONSISTENT   Final  . Barbiturates 02/12/2016 NEGATIVE  <300 ng/mL Final  . medMATCH Barbiturates 02/12/2016 CONSISTENT   Final  . Benzodiazepines 02/12/2016 NEGATIVE  <100 ng/mL Final  . medMATCH Benzodiazepines 02/12/2016 CONSISTENT   Final  . Marijuana Metabolite 02/12/2016 NEGATIVE  <20 ng/mL Final  . medMATCH Marijuana Metab 02/12/2016 CONSISTENT   Final  . Cocaine Metabolite 02/12/2016 NEGATIVE  <150 ng/mL Final  . medMATCH Cocaine Metab 02/12/2016 CONSISTENT  Final  . Methadone Metabolite 02/12/2016 NEGATIVE  <100 ng/mL Final  . [facility] Methadone Metab 02/12/2016  CONSISTENT   Final  . Opiates 02/12/2016 NEGATIVE  <100 ng/mL Final  . medMATCH Opiates 02/12/2016 INCONSISTENT   Final  . Oxycodone 02/12/2016 NEGATIVE  <100 ng/mL Final  . medMATCH Oxycodone 02/12/2016 CONSISTENT   Final  . See Note: 02/12/2016     Final   Comment: This drug testing is for medical treatment only.   Analysis was performed as non-forensic testing and  these results should be used only by healthcare  providers to render diagnosis or treatment, or to  monitor progress of medical conditions.   [facility] comments are:  - present when drug test results may be the result of     metabolism of one or more drugs or when results are     inconsistent with prescribed medication(s) listed.  - may be blank when drug results are consistent with     prescribed medication(s) listed.   For assistance with interpreting these drug results,  please contact a [employer] Toxicology  Specialist: [phone] [place] [phone]), M-F,  8am-6pm EST.   This drug testing is for medical treatment only. Analysis was performed as non-forensic testing and these results should be used only by healthcare providers to render diagnosis or treatment, or to monitor progress of medical conditions.   For assistance with interpreting these dr                          ug results, please contact a [employer] Toxicology Specialist: [phone] [place] [phone]), M-F, 8am-6pm EST.     . Interferon Gamma Release Assay 02/12/2016 NEGATIVE  NEGATIVE Final  . Quantiferon Nil Value 02/12/2016 0.06  IU/mL Final  . Mitogen-Nil 02/12/2016 >10.00  IU/mL Final  . Quantiferon Tb Ag Minus Nil Value 02/12/2016 <0.00  IU/mL Final   Comment:   The Nil tube value is used to determine if the patient has a preexisting immune response which could cause a false-positive reading on the test. In order for a test to be valid, the Nil tube must have a value of less than or equal to 8.0 IU/mL.   The  mitogen control tube is used to assure the patient has a healthy immune status and also serves as a control for correct blood handling and incubation. It is used to detect false-negative readings. The mitogen tube must have a gamma interferon value of greater than or equal to 0.5 IU/mL higher than the value of the Nil tube.   The TB antigen tube is coated with the M. tuberculosis specific antigens. For a test to be considered positive, the TB antigen tube value minus the Nil tube value must be greater than or equal to 0.35 IU/mL.   For additional information, please refer to http://education.quest[facility].com/faq/QFT (This link is being provided for informational/educational purposes only.)       Imaging: No results found.  Speciality Comments: No specialty comments available.    Procedures:  No procedures performed Allergies: Doxycycline and Humira [adalimumab]   Assessment / Plan:     Visit Diagnoses: High risk medication use - [id number]- 1 tablet by mouth q day?SSZ - Plan: Lipid panel, CBC with Differential/Platelet, COMPLETE METABOLIC PANEL WITH GFR, Lipid panel, CBC with Differential/Platelet, COMPLETE METABOLIC PANEL WITH GFR  Rheumatoid arthritis involving multiple sites with positive rheumatoid factor (HCC) - negative CCP, erosive disease  Osteoarthritis of lumbar spine, unspecified spinal osteoarthritis complication status  Primary osteoarthritis of both knees  Chondromalacia of patella, unspecified laterality  Compression fracture of vertebra with routine healing, subsequent encounter - T10 and T11  History of MRSA infection  Cigarette smoker  Osteopenia of multiple sites  Multiple pulmonary nodules  Plantar fasciitis of left foot   Plan: #1: Patient will need updated lipid panel. [place] no prescription for that. Since she is getting labs drawn tomorrow, they can do a CBC with differential CMP with GFR and lipid panel prior to the surgical blood  draw  #2: History of rheumatoid arthritis. Doing well with Morrie Sheldon. Morrie Sheldon is on hold at this time.  #3: Chronic cigarette smoker. Smoking cessation discussed at length in the past as well as today. Patient will make best effort  #4: Tomorrow, patient is scheduled for back surgery with Dr. Hewitt Shorts office.   #5: Bilateral knee joint pain. Right worse than left. Has a history of having meniscal repair through Dr. Alvester Morin office. He's having ongoing swelling to bilateral knees after he works. Temporarily he will use a sleeve brace for the right knee but we'll see Dr. French Ana at his earliest opportunity for evaluation and treatment  #6: Return to clinic in 4 months.  #7: Note: Patient states that he has a follow-up appointment with Dr. Cyndy Freeze 3 months after the surgery.  Orders: Orders Placed This Encounter  Procedures  . Lipid panel  . CBC with Differential/Platelet  . COMPLETE METABOLIC PANEL WITH GFR   No orders of the defined types were placed in this encounter.   Face-to-face time spent with patient was 30 minutes. 50% of time was spent in counseling and coordination of care.  Follow-Up Instructions: Return in about 4 months (around 11/14/2016) for RA,XELJANX (ON HOLD), .   Eliezer Lofts, PA-C  Note - This record has been created using [employer].  Chart creation errors have been sought, but may not always  have been located. Such creation errors do not reflect on  the standard of medical care.

## 2016-07-14 NOTE — Telephone Encounter (Signed)
Patient called this morning requesting a refill on his hydrocodone.  He would like to pick it up when he comes for his appointment tomorrow.  Thank you.

## 2016-07-14 NOTE — Telephone Encounter (Signed)
Will refill at office visit tomorrow since it is too early to refill today

## 2016-07-15 ENCOUNTER — Ambulatory Visit (INDEPENDENT_AMBULATORY_CARE_PROVIDER_SITE_OTHER): Payer: BLUE CROSS/BLUE SHIELD | Admitting: Rheumatology

## 2016-07-15 ENCOUNTER — Encounter: Payer: Self-pay | Admitting: Rheumatology

## 2016-07-15 VITALS — BP 138/80 | HR 88 | Resp 18 | Ht 75.0 in | Wt 277.0 lb

## 2016-07-15 DIAGNOSIS — M2240 Chondromalacia patellae, unspecified knee: Secondary | ICD-10-CM

## 2016-07-15 DIAGNOSIS — M47816 Spondylosis without myelopathy or radiculopathy, lumbar region: Secondary | ICD-10-CM

## 2016-07-15 DIAGNOSIS — Z79899 Other long term (current) drug therapy: Secondary | ICD-10-CM

## 2016-07-15 DIAGNOSIS — M722 Plantar fascial fibromatosis: Secondary | ICD-10-CM

## 2016-07-15 DIAGNOSIS — M170 Bilateral primary osteoarthritis of knee: Secondary | ICD-10-CM | POA: Diagnosis not present

## 2016-07-15 DIAGNOSIS — Z8614 Personal history of Methicillin resistant Staphylococcus aureus infection: Secondary | ICD-10-CM

## 2016-07-15 DIAGNOSIS — M8589 Other specified disorders of bone density and structure, multiple sites: Secondary | ICD-10-CM | POA: Diagnosis not present

## 2016-07-15 DIAGNOSIS — F17210 Nicotine dependence, cigarettes, uncomplicated: Secondary | ICD-10-CM

## 2016-07-15 DIAGNOSIS — M0579 Rheumatoid arthritis with rheumatoid factor of multiple sites without organ or systems involvement: Secondary | ICD-10-CM | POA: Diagnosis not present

## 2016-07-15 DIAGNOSIS — M4850XD Collapsed vertebra, not elsewhere classified, site unspecified, subsequent encounter for fracture with routine healing: Secondary | ICD-10-CM | POA: Diagnosis not present

## 2016-07-15 DIAGNOSIS — R918 Other nonspecific abnormal finding of lung field: Secondary | ICD-10-CM | POA: Diagnosis not present

## 2016-07-15 DIAGNOSIS — IMO0001 Reserved for inherently not codable concepts without codable children: Secondary | ICD-10-CM

## 2016-07-15 MED ORDER — DEXTROSE 5 % IV SOLN
3.0000 g | INTRAVENOUS | Status: AC
  Administered 2016-07-16: 3 g via INTRAVENOUS
  Filled 2016-07-15: qty 3000

## 2016-07-15 NOTE — Patient Instructions (Signed)
Tofacitinib extended-release tablets What is this medicine? TOFACITINIB (TOE fa SYE it nib) is a medicine that works on the immune system. This medicine is used to treat rheumatoid arthritis and psoriatic arthritis. This medicine may be used for other purposes; ask your health care provider or pharmacist if you have questions. COMMON BRAND NAME(S): Morrie Sheldon XR What should I tell my health care provider before I take this medicine? They need to know if you have any of these conditions: -cancer -diabetes -high cholesterol -immune system problems -infection (especially a virus infection such as chickenpox, cold sores, or herpes) -kidney disease -liver disease -low blood counts, like low white cell, platelet, or red cell counts -stomach or intestine problems -an unusual or allergic reaction to tofacitinib, other medicines, foods, dyes, or preservatives -pregnant or trying to get pregnant -breast-feeding How should I use this medicine? Take this medicine by mouth with a glass of water. Follow the directions on the prescription label. Do not cut, crush or chew this medicine. You can take it with or without food. If it upsets your stomach, take it with food. Take your medicine at regular intervals. Do not take it more often than directed. Do not stop taking except on your doctor's advice. A special MedGuide will be given to you by the pharmacist with each prescription and refill. Be sure to read this information carefully each time. Talk to your pediatrician regarding the use of this medicine in children. Special care may be needed. Overdosage: If you think you have taken too much of this medicine contact a poison control center or emergency room at once. NOTE: This medicine is only for you. Do not share this medicine with others. What if I miss a dose? If you miss a dose, take it as soon as you can. If it is almost time for your next dose, take only that dose. Do not take double or extra  doses. What may interact with this medicine? Do not take this medicine with any of the following medications: -azathioprine, cyclosporine, or other immunosuppressive drugs -biologic medicines for arthritis such as abatacept, adalimumab, anakinra, certolizumab, etanercept, golimumab, infliximab, rituximab, secukinumab, tocilizumab, ustekinumab -live vaccines This medicine may also interact with the following medications: -antiviral medicines for hepatitis, HIV or AIDS -certain medicines for fungal infections like fluconazole, itraconazole, ketoconazole, voriconazole -certain medicines for seizures like carbamazepine, phenobarbital, phenytoin -rifampin -supplements, such as St. John's wort This list may not describe all possible interactions. Give your health care provider a list of all the medicines, herbs, non-prescription drugs, or dietary supplements you use. Also tell them if you smoke, drink alcohol, or use illegal drugs. Some items may interact with your medicine. What should I watch for while using this medicine? Tell your doctor or healthcare professional if your symptoms do not start to get better or if they get worse. The tablet shell of this medicine does not dissolve. This is normal. The tablet shell may appear whole in the stool. This is not a cause for concern. Avoid taking products that contain aspirin, acetaminophen, ibuprofen, naproxen, or ketoprofen unless instructed by your doctor. These medicines may hide a fever. Call your doctor or health care professional for advice if you get a fever, chills or sore throat, or other symptoms of a cold or flu. Do not treat yourself. This drug decreases your body's ability to fight infections. Try to avoid being around people who are sick. What side effects may I notice from receiving this medicine? Side effects that you should report  to your doctor or health care professional as soon as possible: -allergic reactions like skin rash, itching  or hives, swelling of the face, lips, or tongue -breathing problems -dizziness -signs of infection - fever or chills, cough, sore throat, pain or trouble passing urine -signs and symptoms of liver injury like dark yellow or brown urine; general ill feeling or flu-like symptoms; light-colored stools; loss of appetite; nausea; right upper belly pain; unusually weak or tired; yellowing of the eyes or skin -stomach pain or a sudden change in bowel habits -unusually weak or tired Side effects that usually do not require medical attention (report to your doctor or health care professional if they continue or are bothersome): -diarrhea -headache -muscle aches -runny nose -sinus trouble This list may not describe all possible side effects. Call your doctor for medical advice about side effects. You may report side effects to FDA at 1-800-FDA-1088. Where should I keep my medicine? Keep out of the reach of children. Store between 20 and 25 degrees C (68 and 77 degrees F). Throw away any unused medicine after the expiration date. NOTE: This sheet is a summary. It may not cover all possible information. If you have questions about this medicine, talk to your doctor, pharmacist, or health care provider.  2018 Elsevier/Gold Standard (2016-04-11 11:55:35)

## 2016-07-15 NOTE — Progress Notes (Signed)
Rheumatology Medication Review by a Pharmacist Does the patient feel that his/her medications are working for him/her?  Yes, but patient has been off [place] since 07/10/16 since he is having surgery tomorrow.  Has the patient been experiencing any side effects to the medications prescribed?  No Does the patient have any problems obtaining medications?  No  Issues to address at subsequent visits: None   Pharmacist comments:  Jeff Russell is a pleasant 51 yo M who presents for follow up of rheumatoid arthritis.  He is prescribed Xeljanz XR 11 mg daily.  He is currently holding his medication due to surgery tomorrow.  Russell recent standing labs were on 05/27/16.  Russell recent TB Gold was negative on 02/12/16.  TB Gold will be due again in November 2018.  I do not see a lipid panel since patient was started on Xeljanz.  I recommended getting a fasting lipid panel.  Patient denies any questions or concerns regarding his medications at this time.    Jeff Russell, Pharm.D., BCPS, CPP Clinical Pharmacist Pager: [phone] Phone: [phone] 07/15/2016 9:51 AM

## 2016-07-16 ENCOUNTER — Inpatient Hospital Stay (HOSPITAL_COMMUNITY)
Admission: RE | Disposition: A | Discharge status: Home / Self Care | Payer: Self-pay | Source: Ambulatory Visit | Attending: Neurosurgery

## 2016-07-16 ENCOUNTER — Inpatient Hospital Stay (HOSPITAL_COMMUNITY)
Admission: RE | Admit: 2016-07-16 | Discharge: 2016-07-17 | DRG: 460 | Disposition: A | Discharge status: Home / Self Care | Payer: BLUE CROSS/BLUE SHIELD | Source: Ambulatory Visit | Attending: Neurosurgery | Admitting: Neurosurgery

## 2016-07-16 ENCOUNTER — Encounter (HOSPITAL_COMMUNITY): Payer: Self-pay | Admitting: *Deleted

## 2016-07-16 ENCOUNTER — Inpatient Hospital Stay (HOSPITAL_COMMUNITY): Payer: BLUE CROSS/BLUE SHIELD | Admitting: Certified Registered"

## 2016-07-16 ENCOUNTER — Other Ambulatory Visit (HOSPITAL_COMMUNITY): Payer: Self-pay | Admitting: *Deleted

## 2016-07-16 ENCOUNTER — Inpatient Hospital Stay (HOSPITAL_COMMUNITY): Payer: BLUE CROSS/BLUE SHIELD

## 2016-07-16 DIAGNOSIS — Z888 Allergy status to other drugs, medicaments and biological substances status: Secondary | ICD-10-CM

## 2016-07-16 DIAGNOSIS — M48062 Spinal stenosis, lumbar region with neurogenic claudication: Secondary | ICD-10-CM | POA: Diagnosis present

## 2016-07-16 DIAGNOSIS — Z981 Arthrodesis status: Secondary | ICD-10-CM | POA: Diagnosis not present

## 2016-07-16 DIAGNOSIS — Z8249 Family history of ischemic heart disease and other diseases of the circulatory system: Secondary | ICD-10-CM

## 2016-07-16 DIAGNOSIS — M4316 Spondylolisthesis, lumbar region: Principal | ICD-10-CM | POA: Diagnosis present

## 2016-07-16 DIAGNOSIS — F17200 Nicotine dependence, unspecified, uncomplicated: Secondary | ICD-10-CM | POA: Diagnosis present

## 2016-07-16 DIAGNOSIS — Z825 Family history of asthma and other chronic lower respiratory diseases: Secondary | ICD-10-CM

## 2016-07-16 DIAGNOSIS — Z9049 Acquired absence of other specified parts of digestive tract: Secondary | ICD-10-CM | POA: Diagnosis not present

## 2016-07-16 DIAGNOSIS — Z419 Encounter for procedure for purposes other than remedying health state, unspecified: Secondary | ICD-10-CM

## 2016-07-16 DIAGNOSIS — Z808 Family history of malignant neoplasm of other organs or systems: Secondary | ICD-10-CM | POA: Diagnosis not present

## 2016-07-16 DIAGNOSIS — Z801 Family history of malignant neoplasm of trachea, bronchus and lung: Secondary | ICD-10-CM

## 2016-07-16 DIAGNOSIS — M4690 Unspecified inflammatory spondylopathy, site unspecified: Secondary | ICD-10-CM | POA: Diagnosis not present

## 2016-07-16 DIAGNOSIS — Z9889 Other specified postprocedural states: Secondary | ICD-10-CM | POA: Diagnosis not present

## 2016-07-16 LAB — GLUCOSE, CAPILLARY: Glucose-Capillary: 153 mg/dL — ABNORMAL HIGH (ref 65–99)

## 2016-07-16 LAB — CBC
HEMATOCRIT: 38.1 % — AB (ref 39.0–52.0)
Hemoglobin: 12.2 g/dL — ABNORMAL LOW (ref 13.0–17.0)
MCH: 31.3 pg (ref 26.0–34.0)
MCHC: 32 g/dL (ref 30.0–36.0)
MCV: 97.7 fL (ref 78.0–100.0)
PLATELETS: 247 10*3/uL (ref 150–400)
RBC: 3.9 MIL/uL — ABNORMAL LOW (ref 4.22–5.81)
RDW: 13.4 % (ref 11.5–15.5)
WBC: 9.3 10*3/uL (ref 4.0–10.5)

## 2016-07-16 LAB — CREATININE, SERUM: Creatinine, Ser: 1.08 mg/dL (ref 0.61–1.24)

## 2016-07-16 SURGERY — POSTERIOR LUMBAR FUSION 1 LEVEL
Anesthesia: General | Site: Spine Lumbar

## 2016-07-16 MED ORDER — GABAPENTIN 300 MG PO CAPS
300.0000 mg | ORAL_CAPSULE | Freq: Three times a day (TID) | ORAL | Status: DC
  Administered 2016-07-17 (×3): 300 mg via ORAL
  Filled 2016-07-16 (×3): qty 1

## 2016-07-16 MED ORDER — THROMBIN 20000 UNITS EX SOLR
CUTANEOUS | Status: AC
  Filled 2016-07-16: qty 20000

## 2016-07-16 MED ORDER — ONDANSETRON HCL 4 MG/2ML IJ SOLN
INTRAMUSCULAR | Status: AC
  Filled 2016-07-16: qty 2

## 2016-07-16 MED ORDER — ROCURONIUM BROMIDE 50 MG/5ML IV SOSY
PREFILLED_SYRINGE | INTRAVENOUS | Status: AC
  Filled 2016-07-16: qty 5

## 2016-07-16 MED ORDER — DEXAMETHASONE SODIUM PHOSPHATE 10 MG/ML IJ SOLN
INTRAMUSCULAR | Status: DC | PRN
  Administered 2016-07-16: 4 mg via INTRAVENOUS

## 2016-07-16 MED ORDER — PROPOFOL 10 MG/ML IV BOLUS
INTRAVENOUS | Status: AC
  Filled 2016-07-16: qty 20

## 2016-07-16 MED ORDER — POTASSIUM CHLORIDE IN NACL 20-0.9 MEQ/L-% IV SOLN: INTRAVENOUS | Status: DC

## 2016-07-16 MED ORDER — SUGAMMADEX SODIUM 200 MG/2ML IV SOLN
INTRAVENOUS | Status: DC | PRN
  Administered 2016-07-16: 200 mg via INTRAVENOUS

## 2016-07-16 MED ORDER — INSULIN ASPART 100 UNIT/ML ~~LOC~~ SOLN: 0.0000 [IU] | Freq: Three times a day (TID) | SUBCUTANEOUS | Status: DC

## 2016-07-16 MED ORDER — MEPERIDINE HCL 25 MG/ML IJ SOLN: 6.2500 mg | INTRAMUSCULAR | Status: DC | PRN

## 2016-07-16 MED ORDER — LIDOCAINE-EPINEPHRINE (PF) 2 %-1:200000 IJ SOLN
INTRAMUSCULAR | Status: DC | PRN
  Administered 2016-07-16: 16 mL

## 2016-07-16 MED ORDER — ROCURONIUM BROMIDE 10 MG/ML (PF) SYRINGE
PREFILLED_SYRINGE | INTRAVENOUS | Status: DC | PRN
  Administered 2016-07-16: 20 mg via INTRAVENOUS
  Administered 2016-07-16: 40 mg via INTRAVENOUS
  Administered 2016-07-16: 10 mg via INTRAVENOUS
  Administered 2016-07-16: 20 mg via INTRAVENOUS
  Administered 2016-07-16: 10 mg via INTRAVENOUS

## 2016-07-16 MED ORDER — MENTHOL 3 MG MT LOZG: 1.0000 | LOZENGE | OROMUCOSAL | Status: DC | PRN

## 2016-07-16 MED ORDER — PROMETHAZINE HCL 25 MG/ML IJ SOLN: 6.2500 mg | INTRAMUSCULAR | Status: DC | PRN

## 2016-07-16 MED ORDER — SUFENTANIL CITRATE 50 MCG/ML IV SOLN
INTRAVENOUS | Status: AC
  Filled 2016-07-16: qty 1

## 2016-07-16 MED ORDER — ACETAMINOPHEN 325 MG PO TABS: 650.0000 mg | ORAL_TABLET | ORAL | Status: DC | PRN

## 2016-07-16 MED ORDER — MIDAZOLAM HCL 2 MG/2ML IJ SOLN
INTRAMUSCULAR | Status: AC
  Filled 2016-07-16: qty 2

## 2016-07-16 MED ORDER — LACTATED RINGERS IV SOLN
INTRAVENOUS | Status: DC
  Administered 2016-07-16 (×3): via INTRAVENOUS

## 2016-07-16 MED ORDER — THROMBIN 20000 UNITS EX KIT
PACK | CUTANEOUS | Status: DC | PRN
  Administered 2016-07-16: 13:00:00 via TOPICAL

## 2016-07-16 MED ORDER — MIDAZOLAM HCL 5 MG/5ML IJ SOLN
INTRAMUSCULAR | Status: DC | PRN
  Administered 2016-07-16: 2 mg via INTRAVENOUS

## 2016-07-16 MED ORDER — ZOLPIDEM TARTRATE 5 MG PO TABS: 5.0000 mg | ORAL_TABLET | Freq: Every evening | ORAL | Status: DC | PRN

## 2016-07-16 MED ORDER — NICOTINE 21 MG/24HR TD PT24
21.0000 mg | MEDICATED_PATCH | Freq: Every day | TRANSDERMAL | Status: DC
Start: 2016-07-16 — End: 2016-07-16
  Administered 2016-07-16: 21 mg via TRANSDERMAL
  Filled 2016-07-16: qty 1

## 2016-07-16 MED ORDER — HEPARIN SODIUM (PORCINE) 5000 UNIT/ML IJ SOLN: 5000.0000 [IU] | Freq: Three times a day (TID) | INTRAMUSCULAR | Status: DC

## 2016-07-16 MED ORDER — SENNOSIDES-DOCUSATE SODIUM 8.6-50 MG PO TABS: 1.0000 | ORAL_TABLET | Freq: Every evening | ORAL | Status: DC | PRN

## 2016-07-16 MED ORDER — MAGNESIUM CITRATE PO SOLN: 1.0000 | Freq: Once | ORAL | Status: DC | PRN

## 2016-07-16 MED ORDER — KETOROLAC TROMETHAMINE 30 MG/ML IJ SOLN
INTRAMUSCULAR | Status: AC
  Filled 2016-07-16: qty 1

## 2016-07-16 MED ORDER — ONDANSETRON HCL 4 MG PO TABS: 4.0000 mg | ORAL_TABLET | Freq: Four times a day (QID) | ORAL | Status: DC | PRN

## 2016-07-16 MED ORDER — SODIUM CHLORIDE 0.9 % IV SOLN: 250.0000 mL | INTRAVENOUS | Status: DC

## 2016-07-16 MED ORDER — BISACODYL 5 MG PO TBEC: 5.0000 mg | DELAYED_RELEASE_TABLET | Freq: Every day | ORAL | Status: DC | PRN

## 2016-07-16 MED ORDER — ACETAMINOPHEN 650 MG RE SUPP: 650.0000 mg | RECTAL | Status: DC | PRN

## 2016-07-16 MED ORDER — SODIUM CHLORIDE 0.9% FLUSH: 3.0000 mL | INTRAVENOUS | Status: DC | PRN

## 2016-07-16 MED ORDER — HYDROMORPHONE HCL 1 MG/ML IJ SOLN
INTRAMUSCULAR | Status: DC | PRN
  Administered 2016-07-16: 1 mg via INTRAVENOUS

## 2016-07-16 MED ORDER — LIDOCAINE-EPINEPHRINE (PF) 2 %-1:200000 IJ SOLN
INTRAMUSCULAR | Status: AC
  Filled 2016-07-16: qty 20

## 2016-07-16 MED ORDER — ACETAMINOPHEN 500 MG PO TABS
1000.0000 mg | ORAL_TABLET | Freq: Four times a day (QID) | ORAL | Status: DC
  Administered 2016-07-16 – 2016-07-17 (×3): 1000 mg via ORAL
  Filled 2016-07-16 (×4): qty 2

## 2016-07-16 MED ORDER — LIDOCAINE HCL 2 % EX GEL
CUTANEOUS | Status: DC | PRN
  Administered 2016-07-16: 1 via URETHRAL

## 2016-07-16 MED ORDER — PROPOFOL 10 MG/ML IV BOLUS
INTRAVENOUS | Status: DC | PRN
  Administered 2016-07-16: 200 mg via INTRAVENOUS

## 2016-07-16 MED ORDER — SUFENTANIL CITRATE 50 MCG/ML IV SOLN
INTRAVENOUS | Status: DC | PRN
  Administered 2016-07-16: 20 ug via INTRAVENOUS
  Administered 2016-07-16 (×3): 10 ug via INTRAVENOUS

## 2016-07-16 MED ORDER — LIDOCAINE 2% (20 MG/ML) 5 ML SYRINGE
INTRAMUSCULAR | Status: DC | PRN
  Administered 2016-07-16: 100 mg via INTRAVENOUS

## 2016-07-16 MED ORDER — HYDROCODONE-ACETAMINOPHEN 7.5-325 MG PO TABS
1.0000 | ORAL_TABLET | Freq: Four times a day (QID) | ORAL | Status: DC
  Administered 2016-07-16 – 2016-07-17 (×4): 1 via ORAL
  Filled 2016-07-16 (×4): qty 1

## 2016-07-16 MED ORDER — KETOROLAC TROMETHAMINE 30 MG/ML IJ SOLN
30.0000 mg | Freq: Once | INTRAMUSCULAR | Status: DC | PRN
  Administered 2016-07-16: 30 mg via INTRAVENOUS

## 2016-07-16 MED ORDER — HYDROMORPHONE HCL 1 MG/ML IJ SOLN
INTRAMUSCULAR | Status: AC
  Filled 2016-07-16: qty 1

## 2016-07-16 MED ORDER — SUGAMMADEX SODIUM 200 MG/2ML IV SOLN
INTRAVENOUS | Status: AC
  Filled 2016-07-16: qty 2

## 2016-07-16 MED ORDER — FLUTICASONE PROPIONATE 50 MCG/ACT NA SUSP
2.0000 | Freq: Every day | NASAL | Status: DC | PRN
  Filled 2016-07-16: qty 16

## 2016-07-16 MED ORDER — MUPIROCIN 2 % EX OINT
TOPICAL_OINTMENT | Freq: Two times a day (BID) | CUTANEOUS | Status: DC
Start: 2016-07-16 — End: 2016-07-17
  Administered 2016-07-17: 09:00:00 via TOPICAL
  Filled 2016-07-16: qty 22

## 2016-07-16 MED ORDER — HYDROCODONE-ACETAMINOPHEN 7.5-325 MG PO TABS
ORAL_TABLET | ORAL | Status: AC
  Filled 2016-07-16: qty 1

## 2016-07-16 MED ORDER — BUPIVACAINE HCL (PF) 0.5 % IJ SOLN
INTRAMUSCULAR | Status: AC
  Filled 2016-07-16: qty 30

## 2016-07-16 MED ORDER — HYDROMORPHONE HCL 1 MG/ML IJ SOLN
INTRAMUSCULAR | Status: AC
  Administered 2016-07-16: 0.5 mg via INTRAVENOUS
  Filled 2016-07-16: qty 1

## 2016-07-16 MED ORDER — ONDANSETRON HCL 4 MG/2ML IJ SOLN: 4.0000 mg | Freq: Four times a day (QID) | INTRAMUSCULAR | Status: DC | PRN

## 2016-07-16 MED ORDER — PHENOL 1.4 % MT LIQD: 1.0000 | OROMUCOSAL | Status: DC | PRN

## 2016-07-16 MED ORDER — ONDANSETRON HCL 4 MG/2ML IJ SOLN
INTRAMUSCULAR | Status: DC | PRN
  Administered 2016-07-16: 4 mg via INTRAVENOUS

## 2016-07-16 MED ORDER — PANTOPRAZOLE SODIUM 40 MG PO TBEC
40.0000 mg | DELAYED_RELEASE_TABLET | Freq: Every day | ORAL | Status: DC
  Administered 2016-07-17: 40 mg via ORAL
  Filled 2016-07-16: qty 1

## 2016-07-16 MED ORDER — LIDOCAINE 2% (20 MG/ML) 5 ML SYRINGE
INTRAMUSCULAR | Status: AC
  Filled 2016-07-16: qty 5

## 2016-07-16 MED ORDER — TOFACITINIB CITRATE ER 11 MG PO TB24: 1.0000 | ORAL_TABLET | Freq: Every day | ORAL | Status: DC

## 2016-07-16 MED ORDER — CHLORHEXIDINE GLUCONATE CLOTH 2 % EX PADS: 6.0000 | MEDICATED_PAD | Freq: Once | CUTANEOUS | Status: DC

## 2016-07-16 MED ORDER — MORPHINE SULFATE (PF) 2 MG/ML IV SOLN
2.0000 mg | INTRAVENOUS | Status: DC | PRN
  Administered 2016-07-17: 2 mg via INTRAVENOUS
  Filled 2016-07-16: qty 1

## 2016-07-16 MED ORDER — SODIUM CHLORIDE 0.9% FLUSH
3.0000 mL | Freq: Two times a day (BID) | INTRAVENOUS | Status: DC
  Administered 2016-07-17: via INTRAVENOUS

## 2016-07-16 MED ORDER — DEXAMETHASONE SODIUM PHOSPHATE 10 MG/ML IJ SOLN
INTRAMUSCULAR | Status: AC
  Filled 2016-07-16: qty 1

## 2016-07-16 MED ORDER — HYDROMORPHONE HCL 1 MG/ML IJ SOLN
0.2500 mg | INTRAMUSCULAR | Status: DC | PRN
  Administered 2016-07-16 (×2): 0.5 mg via INTRAVENOUS

## 2016-07-16 MED ORDER — DOCUSATE SODIUM 100 MG PO CAPS
100.0000 mg | ORAL_CAPSULE | Freq: Two times a day (BID) | ORAL | Status: DC
  Administered 2016-07-17 (×2): 100 mg via ORAL
  Filled 2016-07-16 (×2): qty 1

## 2016-07-16 MED ORDER — 0.9 % SODIUM CHLORIDE (POUR BTL) OPTIME
TOPICAL | Status: DC | PRN
  Administered 2016-07-16: 1000 mL

## 2016-07-16 MED ORDER — BUPIVACAINE HCL 0.5 % IJ SOLN
INTRAMUSCULAR | Status: DC | PRN
  Administered 2016-07-16: 30 mL

## 2016-07-16 MED ORDER — HYPROMELLOSE (GONIOSCOPIC) 2.5 % OP SOLN
1.0000 [drp] | Freq: Three times a day (TID) | OPHTHALMIC | Status: DC | PRN
  Filled 2016-07-16: qty 15

## 2016-07-16 MED ORDER — OXYCODONE HCL 5 MG PO TABS
5.0000 mg | ORAL_TABLET | ORAL | Status: DC | PRN
  Administered 2016-07-17: 10 mg via ORAL
  Filled 2016-07-16: qty 2

## 2016-07-16 SURGICAL SUPPLY — 59 items
BLADE CLIPPER SURG (BLADE) IMPLANT
BUR MATCHSTICK NEURO 3.0 LAGG (BURR) ×3 IMPLANT
BUR PRECISION FLUTE 5.0 (BURR) ×3 IMPLANT
CANISTER SUCT 3000ML PPV (MISCELLANEOUS) ×3 IMPLANT
CAP RELINE MOD TULIP RMM (Cap) ×12 IMPLANT
CARTRIDGE OIL MAESTRO DRILL (MISCELLANEOUS) ×1 IMPLANT
CLOSURE WOUND 1/2 X4 (GAUZE/BANDAGES/DRESSINGS)
CONT SPEC 4OZ CLIKSEAL STRL BL (MISCELLANEOUS) ×3 IMPLANT
COVER BACK TABLE 60X90IN (DRAPES) ×3 IMPLANT
DERMABOND ADVANCED (GAUZE/BANDAGES/DRESSINGS) ×2
DERMABOND ADVANCED .7 DNX12 (GAUZE/BANDAGES/DRESSINGS) ×1 IMPLANT
DIFFUSER DRILL AIR PNEUMATIC (MISCELLANEOUS) ×3 IMPLANT
DRAPE C-ARM 42X72 X-RAY (DRAPES) ×6 IMPLANT
DRAPE C-ARMOR (DRAPES) IMPLANT
DRAPE LAPAROTOMY 100X72X124 (DRAPES) ×3 IMPLANT
DRAPE POUCH INSTRU U-SHP 10X18 (DRAPES) ×3 IMPLANT
DRAPE SURG 17X23 STRL (DRAPES) ×3 IMPLANT
DRSG OPSITE POSTOP 4X6 (GAUZE/BANDAGES/DRESSINGS) ×3 IMPLANT
DURAPREP 26ML APPLICATOR (WOUND CARE) ×3 IMPLANT
ELECT REM PT RETURN 9FT ADLT (ELECTROSURGICAL) ×3
ELECTRODE REM PT RTRN 9FT ADLT (ELECTROSURGICAL) ×1 IMPLANT
GAUZE SPONGE 4X4 12PLY STRL (GAUZE/BANDAGES/DRESSINGS) IMPLANT
GLOVE BIOGEL PI IND STRL 7.0 (GLOVE) ×3 IMPLANT
GLOVE BIOGEL PI IND STRL 7.5 (GLOVE) ×2 IMPLANT
GLOVE BIOGEL PI INDICATOR 7.0 (GLOVE) ×6
GLOVE BIOGEL PI INDICATOR 7.5 (GLOVE) ×4
GLOVE ECLIPSE 6.5 STRL STRAW (GLOVE) ×9 IMPLANT
GLOVE SS N UNI LF 6.5 STRL (GLOVE) ×3 IMPLANT
GLOVE SURG SS PI 6.5 STRL IVOR (GLOVE) ×3 IMPLANT
GOWN STRL REUS W/ TWL LRG LVL3 (GOWN DISPOSABLE) ×2 IMPLANT
GOWN STRL REUS W/ TWL XL LVL3 (GOWN DISPOSABLE) ×2 IMPLANT
GOWN STRL REUS W/TWL 2XL LVL3 (GOWN DISPOSABLE) IMPLANT
GOWN STRL REUS W/TWL LRG LVL3 (GOWN DISPOSABLE) ×4
GOWN STRL REUS W/TWL XL LVL3 (GOWN DISPOSABLE) ×4
KIT BASIN OR (CUSTOM PROCEDURE TRAY) ×3 IMPLANT
KIT POSITION SURG JACKSON T1 (MISCELLANEOUS) ×3 IMPLANT
KIT ROOM TURNOVER OR (KITS) ×3 IMPLANT
NEEDLE HYPO 25X1 1.5 SAFETY (NEEDLE) ×3 IMPLANT
NEEDLE SPNL 18GX3.5 QUINCKE PK (NEEDLE) ×3 IMPLANT
NS IRRIG 1000ML POUR BTL (IV SOLUTION) ×3 IMPLANT
OIL CARTRIDGE MAESTRO DRILL (MISCELLANEOUS) ×3
PACK LAMINECTOMY NEURO (CUSTOM PROCEDURE TRAY) ×3 IMPLANT
PAD ARMBOARD 7.5X6 YLW CONV (MISCELLANEOUS) ×6 IMPLANT
ROD RELINE COCR LORD 5.0X35 (Rod) ×6 IMPLANT
SCREW LOCK RSS 4.5/5.0MM (Screw) ×12 IMPLANT
SCREW SHANK RELINE MOD 5.5X35 (Screw) ×4 IMPLANT
SHANK RELINE MOD 5.5X40 (Screw) ×6 IMPLANT
SPACER OPAL 10X24 (Orthopedic Implant) ×6 IMPLANT
SPONGE SURGIFOAM ABS GEL 100 (HEMOSTASIS) ×3 IMPLANT
STRIP CLOSURE SKIN 1/2X4 (GAUZE/BANDAGES/DRESSINGS) IMPLANT
SUT PROLENE 6 0 BV (SUTURE) IMPLANT
SUT VIC AB 0 CT1 18XCR BRD8 (SUTURE) ×2 IMPLANT
SUT VIC AB 0 CT1 8-18 (SUTURE) ×4
SUT VIC AB 2-0 CT1 18 (SUTURE) ×3 IMPLANT
SUT VIC AB 3-0 SH 8-18 (SUTURE) ×3 IMPLANT
TOWEL GREEN STERILE (TOWEL DISPOSABLE) ×3 IMPLANT
TOWEL GREEN STERILE FF (TOWEL DISPOSABLE) ×4 IMPLANT
TRAY FOLEY W/METER SILVER 16FR (SET/KITS/TRAYS/PACK) ×3 IMPLANT
WATER STERILE IRR 1000ML POUR (IV SOLUTION) ×3 IMPLANT

## 2016-07-16 NOTE — Progress Notes (Signed)
Patient arrived to unit alert and oriented x 4. Patient moving all extremities, patient denies pain, numbness or tingling in BUE and BLE. Patient ambulated from stretcher to bed with no difficulty. Patient oriented to unit, call light and telephone placed within patients reach. RN will continue to monitor patient.

## 2016-07-16 NOTE — Transfer of Care (Signed)
Immediate Anesthesia Transfer of Care Note  Patient: Jeff Russell  Procedure(s) Performed: Procedure(s) with comments: POSTERIOR LUMBAR INTERBODY FUSION LUMBAR FOUR-FIVE (N/A) - POSTERIOR LUMBAR INTERBODY FUSION lUMBAR 4- LUMBAR 5  Patient Location: PACU  Anesthesia Type:General  Level of Consciousness: awake, oriented and patient cooperative  Airway & Oxygen Therapy: Patient Spontanous Breathing and Patient connected to nasal cannula oxygen  Post-op Assessment: Report given to RN, Post -op Vital signs reviewed and stable and Patient moving all extremities  Post vital signs: Reviewed and stable  Last Vitals:  Vitals:   07/16/16 0948 07/16/16 1643  BP: 105/67 119/75  Pulse: 89 92  Resp: 20 16  Temp: 37.1 C     Last Pain:  Vitals:   07/16/16 0948  TempSrc: Oral      Patients Stated Pain Goal: 2 (61/44/31 5400)  Complications: No apparent anesthesia complications

## 2016-07-16 NOTE — Anesthesia Procedure Notes (Signed)
Procedure Name: Intubation Date/Time: 07/16/2016 12:14 PM Performed by: Melina Copa, Kelby Adell R Pre-anesthesia Checklist: Patient identified, Emergency Drugs available, Suction available and Patient being monitored Patient Re-evaluated:Patient Re-evaluated prior to inductionOxygen Delivery Method: Circle System Utilized Preoxygenation: Pre-oxygenation with 100% oxygen Intubation Type: IV induction Ventilation: Mask ventilation without difficulty Laryngoscope Size: Mac and 4 Grade View: Grade I Tube type: Oral Tube size: 8.0 mm Number of attempts: 1 Airway Equipment and Method: Stylet Placement Confirmation: ETT inserted through vocal cords under direct vision,  positive ETCO2 and breath sounds checked- equal and bilateral Secured at: 24 cm Tube secured with: Tape Dental Injury: Teeth and Oropharynx as per pre-operative assessment

## 2016-07-16 NOTE — Anesthesia Preprocedure Evaluation (Addendum)
Anesthesia Evaluation  Patient identified by MRN, date of birth, ID band Patient awake    Reviewed: Allergy & Precautions, NPO status , Patient's Chart, lab work & pertinent test results  Airway Mallampati: II  TM Distance: >3 FB     Dental no notable dental hx. (+) Teeth Intact, Dental Advisory Given   Pulmonary Current Smoker,    Pulmonary exam normal        Cardiovascular Normal cardiovascular exam Rhythm:Regular Rate:Normal     Neuro/Psych negative neurological ROS  negative psych ROS   GI/Hepatic Neg liver ROS, GERD  Medicated and Controlled,  Endo/Other  negative endocrine ROS  Renal/GU negative Renal ROS  negative genitourinary   Musculoskeletal   Abdominal (+) + obese,   Peds  Hematology negative hematology ROS (+)   Anesthesia Other Findings   Reproductive/Obstetrics                            Anesthesia Physical Anesthesia Plan  ASA: II  Anesthesia Plan: General   Post-op Pain Management:    Induction: Intravenous  Airway Management Planned: Oral ETT  Additional Equipment: None  Intra-op Plan:   Post-operative Plan: Extubation in OR  Informed Consent: I have reviewed the patients History and Physical, chart, labs and discussed the procedure including the risks, benefits and alternatives for the proposed anesthesia with the patient or authorized representative who has indicated his/her understanding and acceptance.   Dental advisory given  Plan Discussed with: CRNA, Surgeon and Anesthesiologist  Anesthesia Plan Comments:        Anesthesia Quick Evaluation

## 2016-07-16 NOTE — Progress Notes (Signed)
Orthopedic Tech Progress Note Patient Details:  Jeff Russell 09-19-1965 [id number]  Ortho Devices Type of Ortho Device: Other (comment) Ortho Device/Splint Location: Aspen (outside vender brace) ordered via [employer].  Ortho Device/Splint Interventions: Jeff Russell 07/16/2016, 6:09 PM

## 2016-07-16 NOTE — Op Note (Signed)
07/16/2016  4:16 PM  PATIENT:  Jeff Russell  51 y.o. male with significant pain in the lower extremities and lumbar stenosis at L4/5. We have tried conservative treatment without improvement. He has decided to undergo operative decompression of the L4/5 level using pedicle screws and hardware.  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 male whom was taken to the operating room intubated, and placed under a general anesthetic without difficulty. A foley catheter was placed under sterile conditions. He was positioned prone on a Jackson stable with all pressure points properly padded.  His lumbar region was prepped and draped in a sterile manner. I infiltrated 20cc's 1/2%lidocaine/1:2000,000 strength epinephrine into the planned incision. I opened the skin with a 10 blade and took the incision down to the thoracolumbar fascia. I exposed the lamina of L3,4,and 5 in a subperiosteal fashion bilaterally. I confirmed my location with an intraoperative xray.  I placed self retaining retractors and started the decompression.  I decompressed the spinal canal via a complete inferior facetectomy of L4 and semihemilaminectomies of L4 bilaterally. The facets at L4/5 were markedly  overgrown. I took them down in order to decompress the spinal canal and the L4 nerve roots, along with the L5 roots bilaterally. I exposed the lateral aspect of the disc space bilaterally. This allowed for complete decompression of the L4 roots in the lateral recess and neural foramina . I used the drill and Kerrison punches to remove the bone around the nerve root. PLIF's were performed at L4/5 in the same fashion. I opened the disc space with a 15 blade then used a variety of instruments to remove the disc and prepare the space for the arthrodesis. I used curettes, rongeurs, punches, shavers for the disc space, and rasps in the discetomy. I measured the disc space and placed 2  Peek cages(Synthes) into the disc space(s), along with morsels of autograft.   I placed pedicle screws at L4,and L5, using fluoroscopic guidance. I drilled a pilot hole, then cannulated the pedicle with a drill and probe at each site. I then tapped each pedicle, assessing each site for pedicle violations. No cutouts were appreciated. Screws Harlin Heys) were then placed at each site without difficulty. We attached rods and locking caps with the appropriate tools. The locking caps were secured with torque limited screwdrivers. Final films were performed and the final construct appeared to be in good position.  I closed the wound in a layered fashion. I approximated the thoracolumbar fascia, subcutaneous, and subcuticular planes with vicryl sutures. I used dermabond, and an occlusive for a sterile dressing.     PLAN OF CARE: Admit to inpatient   PATIENT DISPOSITION:  PACU - hemodynamically stable.   Delay start of Pharmacological VTE agent (>24hrs) due to surgical blood loss or risk of bleeding:  yes

## 2016-07-16 NOTE — H&P (Signed)
BP 105/67   Pulse 89   Temp 98.8 F (37.1 C) (Oral)   Resp 20   SpO2 97%  Mr. Rockhill is the husband of Hassan Rowan Prim, who I know very well, and frankly know both of them well. He comes in today for evaluation of a two-year history of pain in his lower extremities, but what is more troublesome to him is onset of weakness now in both lower extremities. He says he could deal with pain, but the weakness is what scares him. He has a great deal of difficulty getting up from a chair. If he does not have arm rests to raise himself he would not be able to do it. He has to take stairs with two feet, unable to muster the strength to go up one foot on one landing at a time. He is 51 years of age. He is right-handed. What is bothering Mr. Bonilla the most is weakness and he says this is in his legs. He has no problems whatsoever in the upper extremities.  REVIEW OF SYSTEMS: Review of systems positive for cataracts, balance problems, sinus problems, swelling in the feet, leg pain with walking, arthritis, leg weakness, back pain, arm pain, leg pain, joint pain. PAST MEDICAL HISTORY: Past medical history is otherwise good.  Prior Operations: He has undergone a knee operation and he has had a cholecystectomy and an eye operation.   Medications and Allergies: HE HAS ALLERGIES TO DOXYCYCLINE, HE WILL BURN IN THE SUN, AND HE HAS DEVELOPED A RASH TO ENBREL. He takes Gabapentin, Cyclobenzaprine, Methocarbamol, Tramadol, Hydrocodone, Nexium, Morrie Sheldon, Flonase and Tylenol. FAMILY HISTORY: Mother is 19, has heart failure, skin cancer. Father is deceased, had lung cancer and COPD. Atrial fibrillation, skin cancer present in the family.  SOCIAL HISTORY: He does smoke. 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 He does not use alcohol. PHYSICAL EXAMINATION: He is 6 feet 4 inches, weighs 271 pounds, temperature is 97.2, blood pressure is 96/65, pulse is 90. Pain is 7/10. NEUROLOGICAL EXAMINATION: On examination he is alert,  oriented x 4, and answers all questions appropriately. Pupils equal, round and reactive to light. Full extraocular movements, full visual fields. Hearing intact to voice. Symmetric facial movement and sensation. He has 5/5 strength in both upper extremities, 2+ reflexes biceps, triceps, brachioradialis, knees and ankles. He has intact proprioception in the lower extremities. He is profoundly weak, hip flexors, extensors, hamstrings, quadriceps. Planter flexion and dorsiflexion are good on manual exam. He truly is unable to walk on his toes and that is not because of pain; that is because of weakness, and he has difficulty walking on his heels. He has taken a number of falls getting off his lawn mower, for example, getting up out of chairs. He says it is not due to balance; it is simply due to strength in the lower extremities.  IMAGING STUDIES: MRI is reviewed. It shows stenosis at L4-5, severe facet hypertrophy, severe lateral recess stenosis and foraminal stenosis bilaterally. The conus is normal. The cauda equina is normal. IMPRESSION/PLAN: Mr. 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 However, this does not explain weakness, for example, in the hip flexors, the adductors and abductors of the thighs or the lower extremities. There is a lot that I cannot explain. I may be putting too much thought into this and thinking too hard about what is  a straightforward listhesis and stenosis, but I do not think I am. So I will send him for an EMG and nerve conduction of the lower extremities bilaterally and see him in the office afterwards. Mr. Mustin came in today. He really just wanted to set up the surgery and he was not sure how we had to do that, so he just made another appointment. Will be at L4-5 where he has severe lateral recess stenosis and mild central canal stenosis, significant  facet arthropathy and spondylolisthesis. I plan on doing a lumbar decompression and then stabilizing the spine with pedicle screws and rods. I would expect him to also undergo an interbody arthrodesis. The risks and benefits of bleeding, infection, no relief, need for further surgery, nonunion, hardware failure, damage to the nerve roots, weakness in one or both lower extremities, bowel or bladder dysfunction were discussed along with other risks. He was also given a detailed instruction sheet. He understands and wishes to proceed.

## 2016-07-17 LAB — GLUCOSE, CAPILLARY
GLUCOSE-CAPILLARY: 110 mg/dL — AB (ref 65–99)
Glucose-Capillary: 85 mg/dL (ref 65–99)
Glucose-Capillary: 106 mg/dL — ABNORMAL HIGH (ref 65–99)

## 2016-07-17 MED ORDER — CYCLOBENZAPRINE HCL 10 MG PO TABS: 10.0000 mg | ORAL_TABLET | Freq: Three times a day (TID) | ORAL | 0 refills | Status: DC | PRN

## 2016-07-17 MED ORDER — HYDROCODONE-ACETAMINOPHEN 7.5-325 MG PO TABS: 1.0000 | ORAL_TABLET | Freq: Four times a day (QID) | ORAL | 0 refills | Status: DC | PRN

## 2016-07-17 MED FILL — Thrombin For Soln 20000 Unit: CUTANEOUS | Qty: 1 | Status: AC

## 2016-07-17 NOTE — Progress Notes (Signed)
Patient urinary catheter d/c with no difficulty. Patient states the intermittent pressure and sharp pelvic pain subsided once catheter removed. Patient due to void by 1230. RN will continue to monitor patient.

## 2016-07-17 NOTE — Evaluation (Signed)
Occupational Therapy Evaluation Patient Details Name: Jeff Russell MRN: [id number] DOB: Nov 17, 1965 Today's Date: 07/17/2016    History of Present Illness Patient is a 51 y/o male admitted with LE pain and weakness.  Now s/p L4-5 decompression and fusion.  PMH positive for RA, COPD. tobacco use, vertebral compression fx, MRSA, MSSA.   Clinical Impression   Pt admitted with above. He demonstrates the below listed deficits and will benefit from continued OT to maximize safety and independence with BADLs.  Pt presents to OT with pain, generalized weakness, decreased balance.  He requires mod A for LB ADLs and min guard assist for functional transfers.  He may benefit from use of AE to increase his independence with ADLs.  Wife supportive and able to assist at home.  Will follow.      Follow Up Recommendations  No OT follow up;Supervision - Intermittent    Equipment Recommendations  None recommended by OT    Recommendations for Other Services       Precautions / Restrictions Precautions Precautions: Fall;Back Precaution Booklet Issued: No Precaution Comments: Pt is able to verbalize back precautions independently - requires min cues to adhere to them  Required Braces or Orthoses: Spinal Brace Spinal Brace: Lumbar corset;Applied in sitting position      Mobility Bed Mobility Overal bed mobility: Needs Assistance Bed Mobility: Rolling;Sidelying to Sit Rolling: Supervision Sidelying to sit: Min guard       General bed mobility comments: heavy reliance on bedrails   Transfers Overall transfer level: Needs assistance Equipment used: Rolling walker (2 wheeled) Transfers: Sit to/from [facility] Sit to Stand: Min guard;From elevated surface Stand pivot transfers: Min guard       General transfer comment: pt with difficulty standing from low surface     Balance Overall balance assessment: Needs assistance Sitting-balance support: Feet supported Sitting  balance-Leahy Scale: Fair     Standing balance support: Single extremity supported Standing balance-Leahy Scale: Poor Standing balance comment: UE support for balance due to pain and LE weakness                           ADL either performed or assessed with clinical judgement   ADL Overall ADL's : Needs assistance/impaired Eating/Feeding: Independent   Grooming: Wash/dry hands;Wash/dry face;Oral care;Brushing hair;Min guard;Standing Grooming Details (indicate cue type and reason): Pt instructed to avoid bending when brushing teeth and when shaving  Upper Body Bathing: Set up;Supervision/ safety;Sitting   Lower Body Bathing: Moderate assistance;Sit to/from stand   Upper Body Dressing : Set up;Supervision/safety;Sitting   Lower Body Dressing: Moderate assistance;Sit to/from stand   Toilet Transfer: Min guard;Ambulation;Comfort height toilet;BSC;Grab bars;RW   Toileting- Clothing Manipulation and Hygiene: Minimal assistance;Sit to/from stand       Functional mobility during ADLs: Min guard;Rolling walker General ADL Comments: Pt unable to cross Rt ankle over knee for LB ADLs.  He was able to don brace with supervision      Vision         Perception     Praxis      Pertinent Vitals/Pain Pain Assessment: 0-10 Pain Score: 4  Pain Location: back Pain Descriptors / Indicators: Operative site guarding Pain Intervention(s): Monitored during session     Hand Dominance Right   Extremity/Trunk Assessment Upper Extremity Assessment Upper Extremity Assessment: Overall WFL for tasks assessed   Lower Extremity Assessment Lower Extremity Assessment: Defer to PT evaluation RLE Deficits / Details: AROM WFL, strength grossly  4 to 4+/5, reports weakness improved since surgery LLE Deficits / Details: AROM WFL, strength grossly 4 to 4+/5, reports weakness improved since surgery       Communication Communication Communication: No difficulties   Cognition  Arousal/Alertness: Awake/alert Behavior During Therapy: WFL for tasks assessed/performed Overall Cognitive Status: Within Functional Limits for tasks assessed                                     General Comments       Exercises     Shoulder Instructions      Home Living Family/patient expects to be discharged to:: Private residence Living Arrangements: Spouse/significant other Available Help at Discharge: Family Type of Home: House Home Access: Stairs to enter Technical brewer of Steps: 2   [place]: One level     Bathroom Shower/Tub: Occupational psychologist: Handicapped height     Home Equipment: Environmental consultant - 2 wheels;Cane - single point;Shower seat;Grab bars - tub/shower   Additional Comments: mom has 3:1      Prior Functioning/Environment Level of Independence: Independent        Comments: worked outside doing various jobs        OT Problem List: Decreased activity tolerance;Impaired balance (sitting and/or standing);Decreased safety awareness;Decreased knowledge of use of DME or AE;Decreased knowledge of precautions;Pain      OT Treatment/Interventions: Self-care/ADL training;DME and/or AE instruction;Therapeutic activities;Patient/family education;Balance training    OT Goals(Current goals can be found in the care plan section) Acute Rehab OT Goals Patient Stated Goal: To return to independent/back to work OT Goal Formulation: With patient Time For Goal Achievement: 07/24/16 Potential to Achieve Goals: Good ADL Goals Pt Will Perform Grooming: with supervision;standing Pt Will Perform Lower Body Bathing: with supervision;with adaptive equipment;sit to/from stand Pt Will Perform Lower Body Dressing: with supervision;with adaptive equipment;sit to/from stand Pt Will Transfer to Toilet: with supervision;ambulating;regular height toilet;bedside commode;grab bars Pt Will Perform Toileting - Clothing Manipulation and hygiene: with  supervision;sit to/from stand  OT Frequency: Min 2X/week   Barriers to D/C:            Co-evaluation              End of Session Equipment Utilized During Treatment: Gait belt;Back brace;Rolling walker Nurse Communication: Mobility status  Activity Tolerance: Patient tolerated treatment well Patient left: in chair;with call bell/phone within reach  OT Visit Diagnosis: Pain;Unsteadiness on feet (R26.81)                Time: 1638-4536 OT Time Calculation (min): 31 min Charges:  OT General Charges $OT Visit: 1 Procedure OT Evaluation $OT Eval Moderate Complexity: 1 Procedure OT Treatments $Self Care/Home Management : 8-22 mins G-Codes:     [facility], OTR/L [phone]   Lucille Passy M 07/17/2016, 3:13 PM

## 2016-07-17 NOTE — Discharge Summary (Signed)
Physician Discharge Summary  Patient ID: Jeff Russell MRN: [id number] DOB/AGE: 07-13-1965 51 y.o.  Admit date: 07/16/2016 Discharge date: 07/17/2016  Admission Diagnoses:spondylolisthesis lumbar spine, L4/5  Discharge Diagnoses:  Active Problems:   Spondylolisthesis of lumbar region   Discharged Condition: good  Hospital Course: Jeff Russell was admitted and taken to the operating room for an uncomplicated lumbar decompression and fusion at L4/5. He has done very well post op and wishes to go home. He is ambulating, voiding, and tolerating a regular diet. Wound is clean, dry, and without signs of infection.   Treatments: surgery: Lumbar decompression L4/5 beyond the needed exposure for a PLIF POSTERIOR LUMBAR INTERBODY FUSION LUMBAR FOUR-FIVE, 52mm x24 mm peek cages with autograft morsels Non segmental pedicle screw fixation L4,5 (nuvasive mas plif)   Discharge Exam: Blood pressure 121/63, pulse (!) 101, temperature 98.8 F (37.1 C), temperature source Oral, resp. rate 20, height 6\' 3"  (1.905 m), weight 78.5 kg (173 lb), SpO2 97 %. General appearance: alert, cooperative, appears stated age and mild distress Neurologic: Alert and oriented X 3, normal strength and tone. Normal symmetric reflexes. Normal coordination and gait  Disposition: 01-Home or Self Care SPINAL STENOSIS OF LUMBAR REGION WITH NEUROGENIC CLAUDICATION  Allergies as of 07/17/2016      Reactions   Doxycycline Other (See Comments)   "burns his face" (Sun Sensitivity?)   Humira [adalimumab] Swelling, Rash   SWELLING REACTION UNSPECIFIED       Medication List    STOP taking these medications   HYDROcodone-acetaminophen 5-325 MG tablet Commonly known as:  NORCO/VICODIN Replaced by:  HYDROcodone-acetaminophen 7.5-325 MG tablet     TAKE these medications   cyclobenzaprine 10 MG tablet Commonly known as:  FLEXERIL Take 1 tablet (10 mg total) by mouth 3 (three) times daily as needed for muscle spasms. What  changed:  how much to take  how to take this  when to take this  reasons to take this  additional instructions   esomeprazole 20 MG capsule Commonly known as:  NEXIUM Take 20 mg by mouth daily before breakfast.   fluticasone 50 MCG/ACT nasal spray Commonly known as:  FLONASE Place 2 sprays into both nostrils daily as needed for allergies or rhinitis.   gabapentin 300 MG capsule Commonly known as:  NEURONTIN TAKE ONE (1) CAPSULE THREE (3) TIMES EACH DAY What changed:  See the new instructions.   HYDROcodone-acetaminophen 7.5-325 MG tablet Commonly known as:  NORCO Take 1 tablet by mouth every 6 (six) hours as needed for moderate pain. Replaces:  HYDROcodone-acetaminophen 5-325 MG tablet   hydroxypropyl methylcellulose / hypromellose 2.5 % ophthalmic solution Commonly known as:  ISOPTO TEARS / GONIOVISC Place 1 drop into both eyes 3 (three) times daily as needed for dry eyes.   methocarbamol 500 MG tablet Commonly known as:  ROBAXIN Take 500 mg by mouth at bedtime.   mupirocin ointment 2 % Commonly known as:  BACTROBAN   traMADol 50 MG tablet Commonly known as:  ULTRAM TAKE 1 TO 2 TABLETS BY MOUTH THREE TIMESDAILY AS NEEDED What changed:  See the new instructions.   TYLENOL ARTHRITIS PAIN 650 MG CR tablet Generic drug:  acetaminophen Take 1,300 mg by mouth 2 (two) times daily as needed for pain.   XELJANZ XR 11 MG Tb24 Generic drug:  Tofacitinib Citrate TAKE ONE TABLET BY MOUTH EVERY DAY      Follow-up Information    Dearius Hoffmann L, MD Follow up in 3 week(s).   Specialty:  Neurosurgery Why:  please call to make an appointment Contact information: [address] [place] [address] [phone]           Signed: Winfield Cunas 07/17/2016, 6:35 PM

## 2016-07-17 NOTE — Evaluation (Signed)
Physical Therapy Evaluation Patient Details Name: Jeff Russell MRN: [id number] DOB: July 13, 1965 Today's Date: 07/17/2016   History of Present Illness  Patient is a 51 y/o male admitted with LE pain and weakness.  Now s/p L4-5 decompression and fusion.  PMH positive for RA, COPD. tobacco use, vertebral compression fx, MRSA, MSSA.  Clinical Impression  Patient presents with decreased mobility due to deficits listed in PT problem list.  He will benefit from skilled PT in the acute setting to allow return home with family support.  Likely to not need follow up PT at d/c.     Follow Up Recommendations No PT follow up    Equipment Recommendations  Rolling walker with 5" wheels    Recommendations for Other Services       Precautions / Restrictions Precautions Precautions: Fall;Back Precaution Booklet Issued: No Precaution Comments: educated in precautions Required Braces or Orthoses: Spinal Brace Spinal Brace: Lumbar corset;Applied in sitting position      Mobility  Bed Mobility Overal bed mobility: Needs Assistance Bed Mobility: Rolling;Sidelying to Sit Rolling: Min assist Sidelying to sit: Min assist       General bed mobility comments: cues for technique, assist to bring hips forward and to lift trunk  Transfers Overall transfer level: Needs assistance Equipment used: Rolling walker (2 wheeled) Transfers: Sit to/from Stand Sit to Stand: Min assist;From elevated surface         General transfer comment: assist for balance and for reaching up to walker  Ambulation/Gait Ambulation/Gait assistance: Min guard;Min assist Ambulation Distance (Feet): 200 Feet Assistive device: Rolling walker (2 wheeled) Gait Pattern/deviations: Step-through pattern;Decreased stride length;Trunk flexed     General Gait Details: flexed forward at hips and assist and cues for improving upright, assist for balance  Stairs            Wheelchair Mobility    Modified Rankin (Stroke  Patients Only)       Balance Overall balance assessment: Needs assistance   Sitting balance-Leahy Scale: Fair     Standing balance support: Bilateral upper extremity supported Standing balance-Leahy Scale: Poor Standing balance comment: UE support for balance due to pain and LE weakness                             Pertinent Vitals/Pain      Home Living                        Prior Function                 Hand Dominance        Extremity/Trunk Assessment   Upper Extremity Assessment Upper Extremity Assessment: Overall WFL for tasks assessed    Lower Extremity Assessment Lower Extremity Assessment: LLE deficits/detail;RLE deficits/detail RLE Deficits / Details: AROM WFL, strength grossly 4 to 4+/5, reports weakness improved since surgery LLE Deficits / Details: AROM WFL, strength grossly 4 to 4+/5, reports weakness improved since surgery       Communication      Cognition                                              General Comments      Exercises     Assessment/Plan    PT Assessment Patient needs continued PT services  PT Problem List Decreased knowledge of use of DME;Pain;Decreased mobility;Decreased knowledge of precautions;Decreased activity tolerance;Decreased balance       PT Treatment Interventions DME instruction;Gait training;Therapeutic exercise;Stair training;Balance training;Functional mobility training;Therapeutic activities;Patient/family education    PT Goals (Current goals can be found in the Care Plan section)  Acute Rehab PT Goals Patient Stated Goal: To return to independent/back to work PT Goal Formulation: With patient/family Time For Goal Achievement: 07/19/16 Potential to Achieve Goals: Good    Frequency Min 5X/week   Barriers to discharge        Co-evaluation               End of Session Equipment Utilized During Treatment: Gait belt;Back brace Activity Tolerance:  Patient tolerated treatment well Patient left: in chair;with call bell/phone within reach;with family/visitor present Nurse Communication: Mobility status PT Visit Diagnosis: Difficulty in walking, not elsewhere classified (R26.2);Pain Pain - part of body:  (back)    Time: 2411-4643 PT Time Calculation (min) (ACUTE ONLY): 31 min   Charges:   PT Evaluation $PT Eval Moderate Complexity: 1 Procedure PT Treatments $Gait Training: 8-22 mins   PT G CodesMagda Kiel, [place] [phone] 07/17/2016   Reginia Naas 07/17/2016, 1:19 PM

## 2016-07-17 NOTE — Anesthesia Postprocedure Evaluation (Addendum)
Anesthesia Post Note  Patient: Jeff Russell  Procedure(s) Performed: Procedure(s) (LRB): POSTERIOR LUMBAR INTERBODY FUSION LUMBAR FOUR-FIVE (N/A)  Patient location during evaluation: PACU Anesthesia Type: General Level of consciousness: awake and sedated Pain management: pain level controlled Vital Signs Assessment: post-procedure vital signs reviewed and stable Respiratory status: spontaneous breathing Cardiovascular status: stable Postop Assessment: no signs of nausea or vomiting Anesthetic complications: no        Last Vitals:  Vitals:   07/17/16 1039 07/17/16 1443  BP: 127/78 121/63  Pulse: 94 (!) 101  Resp: 18 20  Temp: 37.1 C 37.1 C    Last Pain:  Vitals:   07/17/16 1443  TempSrc: Oral  PainSc:    Pain Goal: Patients Stated Pain Goal: 2 (07/17/16 0600)               Kadir Azucena JR,JOHN Elic Vencill

## 2016-07-17 NOTE — Progress Notes (Signed)
D/c instructions reviewed with patient and wife. Case Manager called for DME-rolling walker-Rolling walker will be delivered to patient's room. No further question at this time

## 2016-07-17 NOTE — Discharge Instructions (Signed)

## 2016-07-17 NOTE — Care Management Note (Signed)
Case Management Note  Patient Details  Name: Jeff Russell MRN: [id number] Date of Birth: 12-15-1965  Subjective/Objective:  Pt s/p lumbar surgery. He is from home with his spouse.                  Action/Plan: No f/u per PT. Awaiting OT recs. CM following for d/c needs, physician orders.   Expected Discharge Date:                  Expected Discharge Plan:  Home/Self Care  In-House Referral:     Discharge planning Services     Post Acute Care Choice:    Choice offered to:     DME Arranged:    DME Agency:     HH Arranged:    HH Agency:     Status of Service:  In process, will continue to follow  If discussed at Long Length of Stay Meetings, dates discussed:    Additional Comments:  Pollie Friar, RN 07/17/2016, 2:35 PM

## 2016-08-07 DIAGNOSIS — M48062 Spinal stenosis, lumbar region with neurogenic claudication: Secondary | ICD-10-CM | POA: Diagnosis not present

## 2016-08-19 ENCOUNTER — Other Ambulatory Visit: Payer: Self-pay | Admitting: Rheumatology

## 2016-08-19 NOTE — Telephone Encounter (Signed)
Last Visit: 07/15/16 Next Visit: 11/12/16 UDS: 02/12/16 Narc Agreement: 10/29/15  Okay to refill Tramadol?

## 2016-08-19 NOTE — Telephone Encounter (Signed)
ok 

## 2016-08-20 ENCOUNTER — Other Ambulatory Visit: Payer: Self-pay | Admitting: Rheumatology

## 2016-08-20 NOTE — Telephone Encounter (Signed)
Last Visit: 07/15/16 Next Visit: 11/12/16 Labs: 05/27/16 WNL  Okay to refill per [name]

## 2016-08-26 ENCOUNTER — Encounter: Payer: Self-pay | Admitting: Gastroenterology

## 2016-08-29 DIAGNOSIS — B352 Tinea manuum: Secondary | ICD-10-CM | POA: Diagnosis not present

## 2016-08-29 DIAGNOSIS — B351 Tinea unguium: Secondary | ICD-10-CM | POA: Diagnosis not present

## 2016-08-29 DIAGNOSIS — B356 Tinea cruris: Secondary | ICD-10-CM | POA: Diagnosis not present

## 2016-08-29 DIAGNOSIS — B353 Tinea pedis: Secondary | ICD-10-CM | POA: Diagnosis not present

## 2016-09-09 ENCOUNTER — Other Ambulatory Visit: Payer: Self-pay | Admitting: Internal Medicine

## 2016-09-09 DIAGNOSIS — R918 Other nonspecific abnormal finding of lung field: Secondary | ICD-10-CM

## 2016-09-10 ENCOUNTER — Telehealth: Payer: Self-pay | Admitting: Internal Medicine

## 2016-09-11 NOTE — Telephone Encounter (Signed)
Spoke with Charleston Ropes, States that she no longer needs this order, states that she spoke with [facility] and they have already received this order and the patient is scheduled for 09/15/16 at 9:30am to have the CT done. Nothing further needed.

## 2016-09-11 NOTE — Telephone Encounter (Signed)
pts insurance company is calling and pt is wanting to have CT done at [facility].  MW are you wanting the pt to have a CT done?  I didn't see an order for this. He does not have a pending appt with you either.  Please advise. Thanks

## 2016-09-11 NOTE — Telephone Encounter (Signed)
Faxed ct chest order to [email or web address] [phone] per request Brien Few

## 2016-09-11 NOTE — Telephone Encounter (Signed)
Due 10/2016 but ok to go ahead and order now as missed the last one   Dx spn/ no contrast

## 2016-09-12 NOTE — Addendum Note (Signed)
Addendum  created 09/12/16 1153 by Patrizia Paule, MD   Sign clinical note    

## 2016-09-15 DIAGNOSIS — R918 Other nonspecific abnormal finding of lung field: Secondary | ICD-10-CM | POA: Diagnosis not present

## 2016-09-15 DIAGNOSIS — Z87891 Personal history of nicotine dependence: Secondary | ICD-10-CM | POA: Diagnosis not present

## 2016-09-16 ENCOUNTER — Other Ambulatory Visit: Payer: Self-pay | Admitting: Rheumatology

## 2016-09-16 NOTE — Telephone Encounter (Signed)
Last Visit: 07/15/16 Next Visit: 11/12/16 Labs: 05/27/16 WNL  Okay to refill Morrie Sheldon?

## 2016-09-22 ENCOUNTER — Telehealth: Payer: Self-pay | Admitting: Internal Medicine

## 2016-09-22 NOTE — Telephone Encounter (Signed)
MW reviewed CT Chest done at [facility] on 09/15/16  Per MW- the nodules are smaller, so need ov with MW and cxr in 6 months  ATC, NA and no option to leave msg

## 2016-09-24 ENCOUNTER — Other Ambulatory Visit: Payer: Self-pay | Admitting: Rheumatology

## 2016-09-24 NOTE — Telephone Encounter (Signed)
He needs urine drug screen to refill hydrocodone. We can refill prescription once he comes in the office to get the lab test.

## 2016-09-24 NOTE — Telephone Encounter (Signed)
Spoke with the pt's spouse, Hassan Rowan and notified of results per MW  She verbalized understanding  Will inform the pt  His rov with cxr scheduled for 03/17/17

## 2016-09-24 NOTE — Telephone Encounter (Signed)
Patient called requesting a refill on his Hydrocodone.  CB#[phone].  Thank you.

## 2016-09-24 NOTE — Telephone Encounter (Signed)
Last Visit: 07/15/16 Next Visit: 11/12/16 UDS: 02/12/16 Narc Agreement: 10/29/15  Left message for patient to contact the office. Patient due to update UDS  Okay to refill Hydrocodone?

## 2016-09-25 NOTE — Telephone Encounter (Signed)
Patient advised and will come to office tomorrow for UDS

## 2016-09-26 ENCOUNTER — Other Ambulatory Visit: Payer: Self-pay | Admitting: *Deleted

## 2016-09-26 ENCOUNTER — Other Ambulatory Visit: Payer: Self-pay | Admitting: Rheumatology

## 2016-09-26 DIAGNOSIS — Z79899 Other long term (current) drug therapy: Secondary | ICD-10-CM

## 2016-09-26 DIAGNOSIS — Z5181 Encounter for therapeutic drug level monitoring: Secondary | ICD-10-CM | POA: Diagnosis not present

## 2016-09-26 LAB — CBC WITH DIFFERENTIAL/PLATELET
BASOS ABS: 75 {cells}/uL (ref 0–200)
Basophils Relative: 1 %
EOS PCT: 3 %
Eosinophils Absolute: 225 cells/uL (ref 15–500)
HCT: 44.2 % (ref 38.5–50.0)
Hemoglobin: 14.2 g/dL (ref 13.2–17.1)
LYMPHS PCT: 32 %
Lymphs Abs: 2400 cells/uL (ref 850–3900)
MCH: 30.4 pg (ref 27.0–33.0)
MCHC: 32.1 g/dL (ref 32.0–36.0)
MCV: 94.6 fL (ref 80.0–100.0)
MONOS PCT: 5 %
MPV: 9.6 fL (ref 7.5–12.5)
Monocytes Absolute: 375 cells/uL (ref 200–950)
NEUTROS ABS: 4425 {cells}/uL (ref 1500–7800)
NEUTROS PCT: 59 %
PLATELETS: 272 10*3/uL (ref 140–400)
RBC: 4.67 MIL/uL (ref 4.20–5.80)
RDW: 14.5 % (ref 11.0–15.0)
WBC: 7.5 10*3/uL (ref 3.8–10.8)

## 2016-09-26 MED ORDER — HYDROCODONE-ACETAMINOPHEN 7.5-325 MG PO TABS: 1.0000 | ORAL_TABLET | Freq: Four times a day (QID) | ORAL | 0 refills | Status: DC | PRN

## 2016-09-27 LAB — COMPLETE METABOLIC PANEL WITH GFR
ALK PHOS: 125 U/L — AB (ref 40–115)
ALT: 30 U/L (ref 9–46)
AST: 28 U/L (ref 10–35)
Albumin: 4.2 g/dL (ref 3.6–5.1)
BUN: 12 mg/dL (ref 7–25)
CO2: 21 mmol/L (ref 20–31)
Calcium: 9.9 mg/dL (ref 8.6–10.3)
Chloride: 103 mmol/L (ref 98–110)
Creat: 1.13 mg/dL (ref 0.70–1.33)
GFR, Est African American: 87 mL/min (ref >=60)
GFR, Est Non African American: 75 mL/min (ref >=60)
GLUCOSE: 124 mg/dL — AB (ref 65–99)
POTASSIUM: 5.1 mmol/L (ref 3.5–5.3)
SODIUM: 138 mmol/L (ref 135–146)
Total Bilirubin: 0.4 mg/dL (ref 0.2–1.2)
Total Protein: 7.1 g/dL (ref 6.1–8.1)

## 2016-10-01 DIAGNOSIS — Z79899 Other long term (current) drug therapy: Secondary | ICD-10-CM | POA: Diagnosis not present

## 2016-10-01 DIAGNOSIS — L601 Onycholysis: Secondary | ICD-10-CM | POA: Diagnosis not present

## 2016-10-02 ENCOUNTER — Ambulatory Visit (AMBULATORY_SURGERY_CENTER): Payer: Self-pay

## 2016-10-02 VITALS — Ht 73.0 in | Wt 276.0 lb

## 2016-10-02 DIAGNOSIS — Z1211 Encounter for screening for malignant neoplasm of colon: Secondary | ICD-10-CM

## 2016-10-02 MED ORDER — NA SULFATE-K SULFATE-MG SULF 17.5-3.13-1.6 GM/177ML PO SOLN: 1.0000 | Freq: Once | ORAL | 0 refills | Status: AC

## 2016-10-02 NOTE — Progress Notes (Signed)
Denies allergies to eggs or soy products. Denies complication of anesthesia or sedation. Denies use of weight loss medication. Denies use of O2.   Emmi instructions declined. Patient doesn't have computer.

## 2016-10-03 NOTE — Progress Notes (Signed)
UDS in negative. If he is not taking his medication we will have to discontinue or only give the number of tablets he is taking.

## 2016-10-06 ENCOUNTER — Other Ambulatory Visit: Payer: Self-pay | Admitting: Rheumatology

## 2016-10-06 NOTE — Telephone Encounter (Signed)
ok 

## 2016-10-06 NOTE — Telephone Encounter (Signed)
Last Visit: 07/15/16 Next Visit: 11/12/16 UDS: 09/26/16 Narc Agreement: 10/29/15  Okay to refill Tramadol?

## 2016-10-07 ENCOUNTER — Other Ambulatory Visit: Payer: BLUE CROSS/BLUE SHIELD

## 2016-10-14 ENCOUNTER — Telehealth: Payer: Self-pay | Admitting: Pharmacist

## 2016-10-14 LAB — PAIN MGMT, PROFILE 5 W/CONF, U
Amphetamines: NEGATIVE ng/mL (ref <500)
Barbiturates: NEGATIVE ng/mL (ref <300)
Benzodiazepines: NEGATIVE ng/mL (ref <100)
Cocaine Metabolite: NEGATIVE ng/mL (ref <150)
Creatinine: 101.2 mg/dL (ref >=20.0)
METHADONE METABOLITE: NEGATIVE ng/mL (ref <100)
Marijuana Metabolite: NEGATIVE ng/mL (ref <20)
Opiates: NEGATIVE ng/mL (ref <100)
Oxidant: NEGATIVE ug/mL (ref <200)
Oxycodone: NEGATIVE ng/mL (ref <100)
PH: 5.31 (ref 4.5–9.0)

## 2016-10-14 NOTE — Progress Notes (Signed)
WNL

## 2016-10-14 NOTE — Telephone Encounter (Signed)
I spoke to Marble at [facility] [employer] [facility] regarding patient's negative urine drug screen despite taking hydrocodone 7.5 mg daily.  Chase reported that 9 times out of 10 if a patient is taking hydrocodone once a day it is under the cut off for the urine drug screen.  He said the test is not designed to correlate dose on the urine drug screen.  He said one option would be to order a blood test for quantitative level.  Anderson Malta stated that another option would be to order an opiate expanded test that has a lower cut off of 50 ng/mL.  [place] code [place] [address].  She said this cannot be added to the test done on 09/26/16.    Elisabeth Most, Pharm.D., BCPS, CPP Clinical Pharmacist Pager: [phone] Phone: [phone] 10/14/2016 1:45 PM

## 2016-10-16 ENCOUNTER — Other Ambulatory Visit: Payer: Self-pay | Admitting: Rheumatology

## 2016-10-16 ENCOUNTER — Encounter: Payer: Self-pay | Admitting: Gastroenterology

## 2016-10-16 NOTE — Telephone Encounter (Signed)
07/15/16 last visit  11/12/16 next visit   CBC Latest Ref Rng & Units 09/26/2016 07/16/2016 07/11/2016  WBC 3.8 - 10.8 K/uL 7.5 9.3 6.9  Hemoglobin 13.2 - 17.1 g/dL 14.2 12.2(L) 13.4  Hematocrit 38.5 - 50.0 % 44.2 38.1(L) 41.7  Platelets 140 - 400 K/uL 272 247 243   CMP Latest Ref Rng & Units 09/26/2016 07/16/2016 07/11/2016  Glucose 65 - 99 mg/dL 124(H) - 87  BUN 7 - 25 mg/dL 12 - 8  Creatinine 0.70 - 1.33 mg/dL 1.13 1.08 0.91  Sodium 135 - 146 mmol/L 138 - 138  Potassium 3.5 - 5.3 mmol/L 5.1 - 4.2  Chloride 98 - 110 mmol/L 103 - 106  CO2 20 - 31 mmol/L 21 - 25  Calcium 8.6 - 10.3 mg/dL 9.9 - 9.4  Total Protein 6.1 - 8.1 g/dL 7.1 - -  Total Bilirubin 0.2 - 1.2 mg/dL 0.4 - -  Alkaline Phos 40 - 115 U/L 125(H) - -  AST 10 - 35 U/L 28 - -  ALT 9 - 46 U/L 30 - -   02/2016 negative TB gold Ok to refill per Dr Estanislado Pandy

## 2016-10-17 ENCOUNTER — Telehealth: Payer: Self-pay | Admitting: Gastroenterology

## 2016-10-17 NOTE — Telephone Encounter (Signed)
Spoke with Patrick Jupiter (pharmacis)t at [employer] [phone] regarding prior authorization for Suprep. Rozann Lesches (pharmacist) coupon info [facility]) for the Suprep bowel prep to help with prior authorization. Per Linganore, it drops the price down to $50.00 for the Suprep. Called pt's wife to give the info on Suprep. She states she will go pick up Suprep later today. She will call back if she has further questions.

## 2016-10-21 ENCOUNTER — Encounter: Payer: Self-pay | Admitting: Gastroenterology

## 2016-10-21 ENCOUNTER — Ambulatory Visit (AMBULATORY_SURGERY_CENTER): Payer: BLUE CROSS/BLUE SHIELD | Admitting: Gastroenterology

## 2016-10-21 VITALS — BP 137/90 | HR 72 | Temp 98.0°F | Resp 14 | Ht 73.0 in | Wt 276.0 lb

## 2016-10-21 DIAGNOSIS — D124 Benign neoplasm of descending colon: Secondary | ICD-10-CM | POA: Diagnosis not present

## 2016-10-21 DIAGNOSIS — D122 Benign neoplasm of ascending colon: Secondary | ICD-10-CM

## 2016-10-21 DIAGNOSIS — D120 Benign neoplasm of cecum: Secondary | ICD-10-CM | POA: Diagnosis not present

## 2016-10-21 DIAGNOSIS — Z1212 Encounter for screening for malignant neoplasm of rectum: Secondary | ICD-10-CM | POA: Diagnosis not present

## 2016-10-21 DIAGNOSIS — Z1211 Encounter for screening for malignant neoplasm of colon: Secondary | ICD-10-CM

## 2016-10-21 MED ORDER — SODIUM CHLORIDE 0.9 % IV SOLN: 500.0000 mL | INTRAVENOUS | Status: DC

## 2016-10-21 NOTE — Op Note (Signed)
[place] Patient Name: Jeff Russell Procedure Date: 10/21/2016 12:58 PM MRN: [id number] Endoscopist: Mallie Mussel L. Loletha Carrow , MD Age: 51 Referring MD:  Date of Birth: 09/22/1965 Gender: Male Account #: [id number] Procedure:                Colonoscopy Indications:              Screening for colorectal malignant neoplasm, This                            is the patient's first colonoscopy Medicines:                Monitored Anesthesia Care Procedure:                Pre-Anesthesia Assessment:                           - Prior to the procedure, a History and Physical                            was performed, and patient medications and                            allergies were reviewed. The patient's tolerance of                            previous anesthesia was also reviewed. The risks                            and benefits of the procedure and the sedation                            options and risks were discussed with the patient.                            All questions were answered, and informed consent                            was obtained. Prior Anticoagulants: The patient has                            taken no previous anticoagulant or antiplatelet                            agents. ASA Grade Assessment: III - A patient with                            severe systemic disease. After reviewing the risks                            and benefits, the patient was deemed in                            satisfactory condition to undergo the procedure.  After obtaining informed consent, the colonoscope                            was passed under direct vision. Throughout the                            procedure, the patient's blood pressure, pulse, and                            oxygen saturations were monitored continuously. The                            Colonoscope was introduced through the anus and                            advanced to the the  cecum, identified by                            appendiceal orifice and ileocecal valve. The                            colonoscopy was performed with moderate difficulty                            due to significant looping. Successful completion                            of the procedure was aided by changing the patient                            to a supine position and applying abdominal                            pressure. The patient tolerated the procedure well.                            The quality of the bowel preparation was good. The                            ileocecal valve, appendiceal orifice, and rectum                            were photographed. The quality of the bowel                            preparation was evaluated using the BBPS [facility]                            Bowel Preparation Scale) with scores of: Right                            Colon = 2, Transverse Colon = 2 and Left Colon = 2.  The total BBPS score equals 6. The bowel                            preparation used was SUPREP. Scope In: 1:41:12 PM Scope Out: 2:11:14 PM Scope Withdrawal Time: 0 hours 16 minutes 51 seconds  Total Procedure Duration: 0 hours 30 minutes 2 seconds  Findings:                 A 2 mm polyp was found in the cecum. The polyp was                            sessile. The polyp was removed with a cold biopsy                            forceps. Resection and retrieval were complete.                           Three sessile polyps were found in the descending                            colon and ascending colon. The polyps were 4 to 6                            mm in size. These polyps were removed with a cold                            snare. Resection and retrieval were complete.                           Internal hemorrhoids were found during retroflexion                            and during anoscopy. The hemorrhoids were                             medium-sized and Grade I (internal hemorrhoids that                            do not prolapse).                           The exam was otherwise without abnormality on                            direct and retroflexion views. Complications:            No immediate complications. Estimated Blood Loss:     Estimated blood loss: none. Impression:               - One 2 mm polyp in the cecum, removed with a cold                            biopsy forceps. Resected and retrieved.                           -  Three 4 to 6 mm polyps in the descending colon                            and in the ascending colon, removed with a cold                            snare. Resected and retrieved.                           - Internal hemorrhoids.                           - The examination was otherwise normal on direct                            and retroflexion views. Recommendation:           - Patient has a contact number available for                            emergencies. The signs and symptoms of potential                            delayed complications were discussed with the                            patient. Return to normal activities tomorrow.                            Written discharge instructions were provided to the                            patient.                           - Resume previous diet.                           - Continue present medications.                           - Await pathology results.                           - Repeat colonoscopy is recommended for                            surveillance. The colonoscopy date will be                            determined after pathology results from today's                            exam become available for review.                           - Consider hemorrhoidal  banding. Nyleah Mcginnis L. Loletha Carrow, MD 10/21/2016 2:19:58 PM This report has been signed electronically.

## 2016-10-21 NOTE — Progress Notes (Signed)
No changes in medical or surgical hx since PV 

## 2016-10-21 NOTE — Patient Instructions (Signed)
   INFORMATION ON POLYPS GIVEN TO YOU TODAY   AWAIT PATHOLOGY RESULTS ON POLYPS REMOVED  INFORMATION ON HEMORRHOIDS AND HEMORRHOID BANDING GIVEN TO YOU TODAY   YOU HAD AN ENDOSCOPIC PROCEDURE TODAY AT [place]:   Refer to the procedure report that was given to you for any specific questions about what was found during the examination.  If the procedure report does not answer your questions, please call your gastroenterologist to clarify.  If you requested that your care partner not be given the details of your procedure findings, then the procedure report has been included in a sealed envelope for you to review at your convenience later.  YOU SHOULD EXPECT: Some feelings of bloating in the abdomen. Passage of more gas than usual.  Walking can help get rid of the air that was put into your GI tract during the procedure and reduce the bloating. If you had a lower endoscopy (such as a colonoscopy or flexible sigmoidoscopy) you may notice spotting of blood in your stool or on the toilet paper. If you underwent a bowel prep for your procedure, you may not have a normal bowel movement for a few days.  Please Note:  You might notice some irritation and congestion in your nose or some drainage.  This is from the oxygen used during your procedure.  There is no need for concern and it should clear up in a day or so.  SYMPTOMS TO REPORT IMMEDIATELY:   Following lower endoscopy (colonoscopy or flexible sigmoidoscopy):  Excessive amounts of blood in the stool  Significant tenderness or worsening of abdominal pains  Swelling of the abdomen that is new, acute  Fever of 100F or higher    For urgent or emergent issues, a gastroenterologist can be reached at any hour by calling [phone].   DIET:  We do recommend a small meal at first, but then you may proceed to your regular diet.  Drink plenty of fluids but you should avoid alcoholic beverages for 24 hours.  ACTIVITY:  You  should plan to take it easy for the rest of today and you should NOT DRIVE or use heavy machinery until tomorrow (because of the sedation medicines used during the test).    FOLLOW UP: Our staff will call the number listed on your records the next business day following your procedure to check on you and address any questions or concerns that you may have regarding the information given to you following your procedure. If we do not reach you, we will leave a message.  However, if you are feeling well and you are not experiencing any problems, there is no need to return our call.  We will assume that you have returned to your regular daily activities without incident.  If any biopsies were taken you will be contacted by phone or by letter within the next 1-3 weeks.  Please call us at [phone] if you have not heard about the biopsies in 3 weeks.    SIGNATURES/CONFIDENTIALITY: You and/or your care partner have signed paperwork which will be entered into your electronic medical record.  These signatures attest to the fact that that the information above on your After Visit Summary has been reviewed and is understood.  Full responsibility of the confidentiality of this discharge information lies with you and/or your care-partner.

## 2016-10-21 NOTE — Progress Notes (Signed)
Called to room to assist during endoscopic procedure.  Patient ID and intended procedure confirmed with present staff. Received instructions for my participation in the procedure from the performing physician.  

## 2016-10-21 NOTE — Progress Notes (Signed)
Alert and oriented x3, pleased with MAC, report to RN

## 2016-10-22 ENCOUNTER — Telehealth: Payer: Self-pay

## 2016-10-22 NOTE — Telephone Encounter (Signed)
  Follow up Call-  Call back number 10/21/2016  Post procedure Call Back phone  # [phone]  Permission to leave phone message Yes  Some recent data might be hidden     Patient questions:  Do you have a fever, pain , or abdominal swelling? No. Pain Score  0 *  Have you tolerated food without any problems? Yes.    Have you been able to return to your normal activities? Yes.    Do you have any questions about your discharge instructions: Diet   No. Medications  No. Follow up visit  No.  Do you have questions or concerns about your Care? No.  Actions: * If pain score is 4 or above: No action needed, pain <4.

## 2016-10-27 ENCOUNTER — Telehealth: Payer: Self-pay | Admitting: Pharmacist

## 2016-10-27 ENCOUNTER — Encounter: Payer: Self-pay | Admitting: Rheumatology

## 2016-10-27 ENCOUNTER — Ambulatory Visit (INDEPENDENT_AMBULATORY_CARE_PROVIDER_SITE_OTHER): Payer: BLUE CROSS/BLUE SHIELD | Admitting: Rheumatology

## 2016-10-27 VITALS — BP 119/70 | HR 97 | Ht 75.0 in | Wt 268.0 lb

## 2016-10-27 DIAGNOSIS — M47816 Spondylosis without myelopathy or radiculopathy, lumbar region: Secondary | ICD-10-CM | POA: Diagnosis not present

## 2016-10-27 DIAGNOSIS — Z79899 Other long term (current) drug therapy: Secondary | ICD-10-CM

## 2016-10-27 DIAGNOSIS — M170 Bilateral primary osteoarthritis of knee: Secondary | ICD-10-CM | POA: Diagnosis not present

## 2016-10-27 DIAGNOSIS — M0579 Rheumatoid arthritis with rheumatoid factor of multiple sites without organ or systems involvement: Secondary | ICD-10-CM | POA: Diagnosis not present

## 2016-10-27 DIAGNOSIS — F17210 Nicotine dependence, cigarettes, uncomplicated: Secondary | ICD-10-CM

## 2016-10-27 MED ORDER — HYDROCODONE-ACETAMINOPHEN 7.5-325 MG PO TABS: 1.0000 | ORAL_TABLET | Freq: Four times a day (QID) | ORAL | 0 refills | Status: DC | PRN

## 2016-10-27 MED ORDER — PREDNISONE 5 MG PO TABS: ORAL_TABLET | ORAL | 0 refills | Status: DC

## 2016-10-27 NOTE — Progress Notes (Signed)
Office Visit Note  Patient: Jeff Russell             Date of Birth: 06/06/1965           MRN: [id number]             PCP: Lucretia Kern, DO Referring: Lucretia Kern, DO Visit Date: 10/27/2016 Occupation: @GUAROCC @    Subjective:  Follow-up (4 months R HAND SWOLLEN BACK UP)   History of Present Illness: Jeff Russell is a 51 y.o. male   Who was last seen in our office on 04/10/201 for rheumatoid arthritis with rheumatoid factor positive, negative CCP, history of erosive disease and high-risk prescription Morrie Sheldon).  On April 2018 visit, he was scheduled to see Dr. Cyndy Freeze approximately 07/16/2016 for back surgery. As a result, his Morrie Sheldon 11mg  xr was on hold. Patient was also complaining that his right knee was hurting and he was planning on making an appointment to see Dr. French Ana (who did his knee surgeries) for evaluation and treatment.  Today, patient reports that his back was doing well after the surgery with Dr. Cyndy Freeze. Recently however, patient's back started hurting once again. He will make an appointment with Dr. Cyndy Freeze for evaluation and treatment of this. At the last visit, patient was complaining that his knees were hurting. Patient reports that the knee pain has gone away completely. Patient is doing well with her rheumatoid arthritis. He is taking Salagen Sullivan milligrams X are without any problems and his RA is doing well. When patient went back to work, he reported that his right hand did swell up some but it's doing better now.   Patient restarted work October 13, 2016.  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 Patient feels that the swelling started after he started using the weed eater.   Activities of Daily Living:  Patient reports morning stiffness for 15 minutes.   Patient Denies nocturnal pain.  Difficulty dressing/grooming: Reports Difficulty climbing stairs: Denies Difficulty getting out of chair:  Denies Difficulty using hands for taps, buttons, cutlery, and/or writing: Reports   Review of Systems  Constitutional: Negative for fatigue.  HENT: Negative for mouth sores and mouth dryness.   Eyes: Negative for dryness.  Respiratory: Negative for shortness of breath.   Gastrointestinal: Negative for constipation and diarrhea.  Musculoskeletal: Negative for myalgias and myalgias.  Skin: Negative for sensitivity to sunlight.  Neurological: Negative for memory loss.  Psychiatric/Behavioral: Negative for sleep disturbance.    PMFS History:  Patient Active Problem List   Diagnosis Date Noted  . Spondylolisthesis of lumbar region 07/16/2016  . High risk medication use 07/14/2016  . 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 Primary osteoarthritis of both knees 07/14/2016  . 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 Vertebral compression fracture ([place]) 07/14/2016  . History of MRSA infection 07/14/2016  . Osteopenia of multiple sites 07/14/2016  . Plantar fasciitis of left foot 07/14/2016  . COPD 0 / [place] smoking 02/28/2016  . Morbid obesity due to excess calories ([place]) 02/28/2016  . Multiple pulmonary nodules 11/21/2015  . Multiple environmental allergies 05/16/2014  . Cigarette smoker 05/16/2014  . Rheumatoid arthritis ([place]) 05/01/2011    Past Medical History:  Diagnosis Date  . Allergy   . Arthritis   . COPD (chronic obstructive pulmonary disease) ([place])    per pt  . GERD (gastroesophageal reflux disease)   . History of MRSA infection  right leg per patient  . MSSA (methicillin susceptible Staphylococcus aureus) infection 05/01/2011  . Pneumonia   . Rheumatoid nodules ([place]) 08/06/2014   right lung-per CT scan ordered by Dr Estanislado Pandy  . Septic bursitis 05/01/2011  . Shortness of breath    occ    Family History  Problem Relation Age of Onset  . Arthritis Maternal Grandmother   . Angina Maternal Grandmother   . Colon cancer Paternal Grandfather        deceased  .  Prostate cancer Father   . Hypercholesterolemia Mother   . Heart disease Mother        palpitations, ?heart failure  . Hypertension Mother   . Other Mother        Heart enlargement,left ventricle problems  . Congestive Heart Failure Paternal Grandmother   . Esophageal cancer Neg Hx   . Rectal cancer Neg Hx   . Stomach cancer Neg Hx    Past Surgical History:  Procedure Laterality Date  . BACK SURGERY    . carpel tunnel Bilateral   . CHOLECYSTECTOMY    . COLONOSCOPY    . EPIDURAL BLOCK INJECTION    . EYE SURGERY Left    wire in eye  . I&D EXTREMITY  03/24/2011   Procedure: IRRIGATION AND DEBRIDEMENT EXTREMITY;  Surgeon: Yvette Rack., MD;  Location: [facility];  Service: Orthopedics;  Laterality: Right;  Irrigation and debridement right knee abcess and Bursa,  application of wound vac  . I&D EXTREMITY  03/28/2011   Procedure: IRRIGATION AND DEBRIDEMENT EXTREMITY;  Surgeon: Yvette Rack., MD;  Location: [facility];  Service: Orthopedics;  Laterality: Right;  I&D right knee with wound closure  . MENISCUS REPAIR Right 2017   Social History   Social History Narrative   Work or School: lawn care      Home Situation: lives with wife whom sees me as well       Spiritual Beliefs: believes in God; does not go to church      Lifestyle: no CV exercise - but physically active at work; diet is poor           Objective: Vital Signs: BP 119/70 (BP Location: Left Arm, Patient Position: Sitting, Cuff Size: Normal)   Pulse 97   Ht '6\' 3"'$  (1.905 m)   Wt 268 lb (121.6 kg)   BMI 33.50 kg/m    Physical Exam  Constitutional: He is oriented to person, place, and time. He appears well-developed and well-nourished.  HENT:  Head: Normocephalic and atraumatic.  Eyes: Pupils are equal, round, and reactive to light. Conjunctivae and EOM are normal.  Neck: Normal range of motion. Neck supple.  Cardiovascular: Normal rate, regular rhythm and normal heart sounds.  Exam reveals no gallop and no friction  rub.   No murmur heard. Pulmonary/Chest: Effort normal and breath sounds normal. No respiratory distress. He has no wheezes. He has no rales. He exhibits no tenderness.  Abdominal: Soft. He exhibits no distension and no mass. There is no tenderness. There is no guarding.  Musculoskeletal: Normal range of motion.  Lymphadenopathy:    He has no cervical adenopathy.  Neurological: He is alert and oriented to person, place, and time. He exhibits normal muscle tone. Coordination normal.  Skin: Skin is warm and dry. Capillary refill takes less than 2 seconds. No rash noted.  Psychiatric: He has a normal mood and affect. His behavior is normal. Judgment and thought content normal.  Vitals reviewed.  Musculoskeletal Exam:  Full range of motion of all joints except unable to make a full fist with bilateral hands with right little bit worse than the left. About 70% to 80% fist formation bilaterally. Decreased grip strength. Fiber myalgia tender points are all absent  CDAI Exam: CDAI Homunculus Exam:   Tenderness:  Right hand: 2nd MCP and 3rd MCP  Swelling:  Right hand: 2nd MCP and 3rd MCP  Joint Counts:  CDAI Tender Joint count: 2 CDAI Swollen Joint count: 2  Global Assessments:  Patient Global Assessment: 8 Provider Global Assessment: 8  CDAI Calculated Score: 20  Patient is been having more pain to the right second and third MCP joint. When it hurts, patient rates this discomfort as 8 on a scale of 0-10. He is using a weed eater a lot and has a hard time gripping it. The pain was pretty good and not bothersome until he restarted working after his surgery. After he restarted working, that's when his right hand started becoming more swollen more locking up and more problematic.   Investigation: No additional findings. Orders Only on 09/26/2016  Component Date Value Ref Range Status  . WBC 09/26/2016 7.5  3.8 - 10.8 K/uL Final  . RBC 09/26/2016 4.67  4.20 - 5.80 MIL/uL Final    . Hemoglobin 09/26/2016 14.2  13.2 - 17.1 g/dL Final  . HCT 09/26/2016 44.2  38.5 - 50.0 % Final  . MCV 09/26/2016 94.6  80.0 - 100.0 fL Final  . MCH 09/26/2016 30.4  27.0 - 33.0 pg Final  . MCHC 09/26/2016 32.1  32.0 - 36.0 g/dL Final  . RDW 09/26/2016 14.5  11.0 - 15.0 % Final  . Platelets 09/26/2016 272  140 - 400 K/uL Final  . MPV 09/26/2016 9.6  7.5 - 12.5 fL Final  . Neutro Abs 09/26/2016 4425  1,500 - 7,800 cells/uL Final  . Lymphs Abs 09/26/2016 2400  850 - 3,900 cells/uL Final  . Monocytes Absolute 09/26/2016 375  200 - 950 cells/uL Final  . Eosinophils Absolute 09/26/2016 225  15 - 500 cells/uL Final  . Basophils Absolute 09/26/2016 75  0 - 200 cells/uL Final  . Neutrophils Relative % 09/26/2016 59  % Final  . Lymphocytes Relative 09/26/2016 32  % Final  . Monocytes Relative 09/26/2016 5  % Final  . Eosinophils Relative 09/26/2016 3  % Final  . Basophils Relative 09/26/2016 1  % Final  . Smear Review 09/26/2016 Criteria for review not met   Final  . Sodium 09/26/2016 138  135 - 146 mmol/L Final  . Potassium 09/26/2016 5.1  3.5 - 5.3 mmol/L Final  . Chloride 09/26/2016 103  98 - 110 mmol/L Final  . CO2 09/26/2016 21  20 - 31 mmol/L Final  . Glucose, Bld 09/26/2016 124* 65 - 99 mg/dL Final  . BUN 09/26/2016 12  7 - 25 mg/dL Final  . Creat 09/26/2016 1.13  0.70 - 1.33 mg/dL Final   Comment:   For patients > or = 51 years of age: The upper reference limit for Creatinine is approximately 13% higher for people identified as African-American.     . Total Bilirubin 09/26/2016 0.4  0.2 - 1.2 mg/dL Final  . Alkaline Phosphatase 09/26/2016 125* 40 - 115 U/L Final  . AST 09/26/2016 28  10 - 35 U/L Final  . ALT 09/26/2016 30  9 - 46 U/L Final  . Total Protein 09/26/2016 7.1  6.1 - 8.1 g/dL Final  . Albumin 09/26/2016 4.2  3.6 - 5.1 g/dL Final  . Calcium 19/47/8654 9.9  8.6 - 10.3 mg/dL Final  . GFR, Est African American 09/26/2016 87  >=60 mL/min Final  . GFR, Est Non African  American 09/26/2016 75  >=60 mL/min Final  . Prescribed Drug 1 09/26/2016 Hydrocodone   Corrected   Comment: The pain medications prescribed have been changed as per client request.  Reporting comments have been updated to reflect requested change.   => REVISED: Change in test result(s)   . Creatinine 09/26/2016 101.2  > or = 20.0 mg/dL Final  . pH 56/13/2735 5.31  4.5 - 9.0 Final  . Oxidant 09/26/2016 NEGATIVE  <200 mcg/mL Final  . Amphetamines 09/26/2016 NEGATIVE  <500 ng/mL Final  . [facility] Amphetamines 09/26/2016 CONSISTENT   Final  . Barbiturates 09/26/2016 NEGATIVE  <300 ng/mL Final  . [facility] Barbiturates 09/26/2016 CONSISTENT   Final  . Benzodiazepines 09/26/2016 NEGATIVE  <100 ng/mL Final  . [facility] Benzodiazepines 09/26/2016 CONSISTENT   Final  . Marijuana Metabolite 09/26/2016 NEGATIVE  <20 ng/mL Final  . [facility] Marijuana Metab 09/26/2016 CONSISTENT   Final  . Cocaine Metabolite 09/26/2016 NEGATIVE  <150 ng/mL Final  . [facility] Cocaine Metab 09/26/2016 CONSISTENT   Final  . Methadone Metabolite 09/26/2016 NEGATIVE  <100 ng/mL Final  . [facility] Methadone Metab 09/26/2016 CONSISTENT   Final  . Opiates 09/26/2016 NEGATIVE  <100 ng/mL Final  . [facility] Opiates 09/26/2016 INCONSISTENT   Corrected   Comment: The pain medications prescribed have been changed as per client request.  Reporting comments have been updated to reflect requested change.   => REVISED: Change in test result(s) PLEASE DISREGARD PREVIOUSLY REPORTED INFORMATION BELOW: (The information below was originally reported on 10/03/2016 at 12:57 AM) [facility] Opiates              CONSISTENT              . Oxycodone 09/26/2016 NEGATIVE  <100 ng/mL Final  . [facility]MATCH Oxycodone 09/26/2016 CONSISTENT   Final  . See Note: 09/26/2016     Final   Comment: This drug testing is for medical treatment only.   Analysis was performed as non-forensic testing and  these results should be used only by healthcare  providers  to render diagnosis or treatment, or to  monitor progress of medical conditions.   [facility] comments are:  - present when drug test results may be the result of     metabolism of one or more drugs or when results are     inconsistent with prescribed medication(s) listed.  - may be blank when drug results are consistent with     prescribed medication(s) listed.   For assistance with interpreting these drug results,  please contact a [employer] Toxicology  Specialist: [phone] TOX [phone]), M-F,  8am-6pm EST.   This drug testing is for medical treatment only. Analysis was performed as non-forensic testing and these results should be used only by healthcare providers to render diagnosis or treatment, or to monitor progress of medical conditions.   For assistance with interpreting these dr                          ug results, please contact a [employer] Toxicology Specialist: [phone] TOX [phone]), M-F, 8am-6pm EST.     Admission on 07/16/2016, Discharged on 07/17/2016  Component Date Value Ref Range Status  . WBC 07/16/2016 9.3  4.0 - 10.5 K/uL Final  . RBC 07/16/2016 3.90*  4.22 - 5.81 MIL/uL Final  . Hemoglobin 07/16/2016 12.2* 13.0 - 17.0 g/dL Final  . HCT 07/16/2016 38.1* 39.0 - 52.0 % Final  . MCV 07/16/2016 97.7  78.0 - 100.0 fL Final  . MCH 07/16/2016 31.3  26.0 - 34.0 pg Final  . MCHC 07/16/2016 32.0  30.0 - 36.0 g/dL Final  . RDW 07/16/2016 13.4  11.5 - 15.5 % Final  . Platelets 07/16/2016 247  150 - 400 K/uL Final  . Creatinine, Ser 07/16/2016 1.08  0.61 - 1.24 mg/dL Final  . GFR calc non Af Amer 07/16/2016 >60  >60 mL/min Final  . GFR calc Af Amer 07/16/2016 >60  >60 mL/min Final   Comment: (NOTE) The eGFR has been calculated using the CKD EPI equation. This calculation has not been validated in all clinical situations. eGFR's persistently <60 mL/min signify possible Chronic Kidney Disease.   . Glucose-Capillary 07/16/2016  153* 65 - 99 mg/dL Final  . Comment 1 07/16/2016 Notify RN   Final  . Comment 2 07/16/2016 Document in Chart   Final  . Glucose-Capillary 07/17/2016 110* 65 - 99 mg/dL Final  . Comment 1 07/17/2016 Notify RN   Final  . Comment 2 07/17/2016 Document in Chart   Final  . Glucose-Capillary 07/17/2016 85  65 - 99 mg/dL Final  . Glucose-Capillary 07/17/2016 106* 65 - 99 mg/dL Final  . Comment 1 07/17/2016 Notify RN   Final  . Comment 2 07/17/2016 Document in Chart   Final  Hospital Outpatient Visit on 07/11/2016  Component Date Value Ref Range Status  . MRSA, PCR 07/11/2016 NEGATIVE  NEGATIVE Final  . Staphylococcus aureus 07/11/2016 POSITIVE* NEGATIVE Final   Comment:        The Xpert SA Assay (FDA approved for NASAL specimens in patients over 90 years of age), is one component of a comprehensive surveillance program.  Test performance has been validated by [facility] for patients greater than or equal to 65 year old. It is not intended to diagnose infection nor to guide or monitor treatment.   . Sodium 07/11/2016 138  135 - 145 mmol/L Final  . Potassium 07/11/2016 4.2  3.5 - 5.1 mmol/L Final  . Chloride 07/11/2016 106  101 - 111 mmol/L Final  . CO2 07/11/2016 25  22 - 32 mmol/L Final  . Glucose, Bld 07/11/2016 87  65 - 99 mg/dL Final  . BUN 07/11/2016 8  6 - 20 mg/dL Final  . Creatinine, Ser 07/11/2016 0.91  0.61 - 1.24 mg/dL Final  . Calcium 07/11/2016 9.4  8.9 - 10.3 mg/dL Final  . GFR calc non Af Amer 07/11/2016 >60  >60 mL/min Final  . GFR calc Af Amer 07/11/2016 >60  >60 mL/min Final   Comment: (NOTE) The eGFR has been calculated using the CKD EPI equation. This calculation has not been validated in all clinical situations. eGFR's persistently <60 mL/min signify possible Chronic Kidney Disease.   . Anion gap 07/11/2016 7  5 - 15 Final  . WBC 07/11/2016 6.9  4.0 - 10.5 K/uL Final  . RBC 07/11/2016 4.26  4.22 - 5.81 MIL/uL Final  . Hemoglobin 07/11/2016 13.4  13.0 - 17.0  g/dL Final  . HCT 07/11/2016 41.7  39.0 - 52.0 % Final  . MCV 07/11/2016 97.9  78.0 - 100.0 fL Final  . MCH 07/11/2016 31.5  26.0 - 34.0 pg Final  . MCHC 07/11/2016 32.1  30.0 - 36.0 g/dL Final  . RDW 07/11/2016 13.3  11.5 - 15.5 %  Final  . Platelets 07/11/2016 243  150 - 400 K/uL Final  . ABO/RH(D) 07/11/2016 O POS   Final  . Antibody Screen 07/11/2016 NEG   Final  . Sample Expiration 07/11/2016 07/25/2016   Final  . Extend sample reason 07/11/2016 NO TRANSFUSIONS OR PREGNANCY IN THE PAST 3 MONTHS   Final  Orders Only on 05/27/2016  Component Date Value Ref Range Status  . WBC 05/27/2016 7.4  3.8 - 10.8 K/uL Final  . RBC 05/27/2016 4.37  4.20 - 5.80 MIL/uL Final  . Hemoglobin 05/27/2016 14.0  13.2 - 17.1 g/dL Final  . HCT 05/27/2016 42.4  38.5 - 50.0 % Final  . MCV 05/27/2016 97.0  80.0 - 100.0 fL Final  . MCH 05/27/2016 32.0  27.0 - 33.0 pg Final  . MCHC 05/27/2016 33.0  32.0 - 36.0 g/dL Final  . RDW 05/27/2016 13.2  11.0 - 15.0 % Final  . Platelets 05/27/2016 239  140 - 400 K/uL Final  . MPV 05/27/2016 9.6  7.5 - 12.5 fL Final  . Neutro Abs 05/27/2016 4218  1,500 - 7,800 cells/uL Final  . Lymphs Abs 05/27/2016 2294  850 - 3,900 cells/uL Final  . Monocytes Absolute 05/27/2016 592  200 - 950 cells/uL Final  . Eosinophils Absolute 05/27/2016 222  15 - 500 cells/uL Final  . Basophils Absolute 05/27/2016 74  0 - 200 cells/uL Final  . Neutrophils Relative % 05/27/2016 57  % Final  . Lymphocytes Relative 05/27/2016 31  % Final  . Monocytes Relative 05/27/2016 8  % Final  . Eosinophils Relative 05/27/2016 3  % Final  . Basophils Relative 05/27/2016 1  % Final  . Smear Review 05/27/2016 Criteria for review not met   Final  . Sodium 05/27/2016 139  135 - 146 mmol/L Final  . Potassium 05/27/2016 4.3  3.5 - 5.3 mmol/L Final  . Chloride 05/27/2016 105  98 - 110 mmol/L Final  . CO2 05/27/2016 26  20 - 31 mmol/L Final  . Glucose, Bld 05/27/2016 83  65 - 99 mg/dL Final  . BUN 05/27/2016 11   7 - 25 mg/dL Final  . Creat 05/27/2016 0.97  0.70 - 1.33 mg/dL Final   Comment:   For patients > or = 51 years of age: The upper reference limit for Creatinine is approximately 13% higher for people identified as African-American.     . Total Bilirubin 05/27/2016 0.4  0.2 - 1.2 mg/dL Final  . Alkaline Phosphatase 05/27/2016 105  40 - 115 U/L Final  . AST 05/27/2016 27  10 - 35 U/L Final  . ALT 05/27/2016 23  9 - 46 U/L Final  . Total Protein 05/27/2016 6.7  6.1 - 8.1 g/dL Final  . Albumin 05/27/2016 4.1  3.6 - 5.1 g/dL Final  . Calcium 05/27/2016 9.6  8.6 - 10.3 mg/dL Final  . GFR, Est African American 05/27/2016 >89  >=60 mL/min Final  . GFR, Est Non African American 05/27/2016 >89  >=60 mL/min Final     Imaging: No results found.  Speciality Comments: No specialty comments available.    Procedures:  No procedures performed Allergies: Doxycycline and Humira [adalimumab]   Assessment / Plan:     Visit Diagnoses: Rheumatoid arthritis involving multiple sites with positive rheumatoid factor (HCC)  High risk medication use  Osteoarthritis of lumbar spine, unspecified spinal osteoarthritis complication status  Primary osteoarthritis of both knees  Cigarette smoker   Plan: #1: Rheumatoid arthritis with rheumatoid factor positive, CCP negative, history  of erosive disease. Patient is reporting increased swelling to the right second and third MCP joint with more stiffness and "locking up". Patient stopped xeljanz temporally when he had his back surgery but restarted it soon after that At the appropriate time. He's been on this Harriette Ohara for the last 2 months. Taking 1 tablet regularly every day. Patient reports that due to work, holding a weed eater, he's been having more swelling to the right second and third MCP joint.  #2: High risk prescription Xeljanz 11 mg XR 1 by mouth daily (Possible inadequate response).  Patient reports that he takes his hydrocodone every  day. He reports that he is being told by our office that his urine drug screen keeps reporting that he is not taking any  HYDROCODONE.hE REPORTS THAT HE faithfully takes it every day as prescribed.    #3: Right hand with swelling and pain  #4: Back pain. Recently had surgery mid April 2018 at Dr. Jackelyn Knife office. Patient was doing well initially but now is starting to have more pain. Same symptoms as he had prior to the surgery are starting again. Patient has made plans to follow with Dr. Jackelyn Knife office soon for further evaluation and treatment on this.  #5: Knee pain has resolved on its own. Patient will see Dr. Madelon Lips if the pain restarts to his knees.   #6: Schedule ultrasound of bilateral hands and wrists at the next available.  #7: Refill hydrocodone : Prednisone 5 mg 4 for 2 days, 3 for 2 days, 2 for 2 days, 1 for 2 days, stop  Orders: No orders of the defined types were placed in this encounter.  Meds ordered this encounter  Medications  . DISCONTD: HYDROcodone-acetaminophen (NORCO) 7.5-325 MG tablet    Sig: Take 1 tablet by mouth every 6 (six) hours as needed for moderate pain.    Dispense:  30 tablet    Refill:  0    Order Specific Question:   Supervising Provider    Answer:   Pollyann Savoy [[id number]]  . DISCONTD: HYDROcodone-acetaminophen (NORCO) 7.5-325 MG tablet    Sig: Take 1 tablet by mouth every 6 (six) hours as needed for moderate pain.    Dispense:  30 tablet    Refill:  0    Order Specific Question:   Supervising Provider    Answer:   Pollyann Savoy [[id number]]  . HYDROcodone-acetaminophen (NORCO) 7.5-325 MG tablet    Sig: Take 1 tablet by mouth every 6 (six) hours as needed for moderate pain.    Dispense:  30 tablet    Refill:  0  . predniSONE (DELTASONE) 5 MG tablet    Sig: 4 tabs po x 2 days, 3 tabs po x 2 days, 2  tabs po x 2 days, 1  tab po x 2 days    Dispense:  20 tablet    Refill:  0    Face-to-face time spent with patient was 30 minutes.  Greater than 50% of time was spent in counseling and coordination of care.  Follow-Up Instructions: Return in about 4 months (around 02/27/2017) for RA,rt hand pain, back pain, pain management.   Tawni Pummel, PA-C   I examined and evaluated the patient with Tawni Pummel PA. He had synovitis on his right second MCP on exam today. Which I believe is due to increased activity. We decided to give a short Prednisone taper. The plan of care was discussed as noted above.  Pollyann Savoy, MD  Note - This record has  been created using [employer].  Chart creation errors have been sought, but may not always  have been located. Such creation errors do not reflect on  the standard of medical care.

## 2016-10-27 NOTE — Telephone Encounter (Signed)
I spoke to patient while he was in the office today.  He is currently taking Xeljanz XR 11 mg daily.  Most recent standing labs were on 09/26/16.  Most recent TB Gold was negative on 02/12/16.  TB Gold will be due again in November 2018.  I do not see a lipid panel since patient was started on Xeljanz.  I recommended lipid panel in April 2018 and lipid panel was ordered but it does not appear patient had lipid panel done.  Today, patient reports he is not fasting.  I asked patient if he could come by the office one morning this week to get a fasting lipid panel drawn.  Patient confirms he will come by one morning this week.    Elisabeth Most, Pharm.D., BCPS, CPP Clinical Pharmacist Pager: [phone] Phone: [phone] 10/27/2016 9:34 AM

## 2016-10-28 ENCOUNTER — Other Ambulatory Visit: Payer: Self-pay

## 2016-10-28 ENCOUNTER — Encounter: Payer: Self-pay | Admitting: Gastroenterology

## 2016-10-28 DIAGNOSIS — Z79899 Other long term (current) drug therapy: Secondary | ICD-10-CM | POA: Diagnosis not present

## 2016-10-29 LAB — LIPID PANEL
CHOLESTEROL: 156 mg/dL (ref <200)
HDL: 32 mg/dL — AB (ref >40)
LDL Cholesterol: 88 mg/dL (ref <100)
TRIGLYCERIDES: 178 mg/dL — AB (ref <150)
Total CHOL/HDL Ratio: 4.9 Ratio (ref <5.0)
VLDL: 36 mg/dL — ABNORMAL HIGH (ref <30)

## 2016-10-29 NOTE — Progress Notes (Signed)
High TGs , low HDL. Advise fish oil  and exercise. Pl fax results to his PCP

## 2016-11-05 ENCOUNTER — Telehealth: Payer: Self-pay | Admitting: *Deleted

## 2016-11-05 NOTE — Telephone Encounter (Signed)
Attempted to contact the patient and left message for patient to call the office.  

## 2016-11-05 NOTE — Telephone Encounter (Signed)
-----   Message from [name], MD sent at 10/29/2016  8:41 AM EDT ----- High TGs , low HDL. Advise fish oil  and exercise. Pl fax results to his PCP

## 2016-11-10 NOTE — Telephone Encounter (Signed)
Patient advised of lab results and recommendations. Copy of labs sent to PCP. Patient verbalized understanding.

## 2016-11-12 ENCOUNTER — Ambulatory Visit: Payer: BLUE CROSS/BLUE SHIELD | Admitting: Rheumatology

## 2016-11-17 ENCOUNTER — Other Ambulatory Visit: Payer: Self-pay | Admitting: Rheumatology

## 2016-11-17 NOTE — Telephone Encounter (Signed)
ok 

## 2016-11-17 NOTE — Telephone Encounter (Signed)
Last Visit: 07/15/16 Next Visit: 01/14/17 UDS: 09/26/16 Narc Agreement: 10/29/15 Last Fill: 10/07/16  Okay to refill Tramadol?

## 2016-11-28 DIAGNOSIS — L308 Other specified dermatitis: Secondary | ICD-10-CM | POA: Diagnosis not present

## 2016-11-28 DIAGNOSIS — B353 Tinea pedis: Secondary | ICD-10-CM | POA: Diagnosis not present

## 2016-11-28 DIAGNOSIS — B356 Tinea cruris: Secondary | ICD-10-CM | POA: Diagnosis not present

## 2016-12-01 ENCOUNTER — Ambulatory Visit (HOSPITAL_COMMUNITY)
Admission: RE | Admit: 2016-12-01 | Discharge: 2016-12-01 | Disposition: A | Discharge status: Home / Self Care | Payer: BLUE CROSS/BLUE SHIELD | Source: Ambulatory Visit | Attending: Family Medicine | Admitting: Family Medicine

## 2016-12-01 ENCOUNTER — Telehealth: Payer: Self-pay | Admitting: Rheumatology

## 2016-12-01 ENCOUNTER — Other Ambulatory Visit: Payer: Self-pay | Admitting: Rheumatology

## 2016-12-01 ENCOUNTER — Other Ambulatory Visit (HOSPITAL_COMMUNITY): Payer: Self-pay | Admitting: Orthopedic Surgery

## 2016-12-01 DIAGNOSIS — M7989 Other specified soft tissue disorders: Principal | ICD-10-CM

## 2016-12-01 DIAGNOSIS — M79605 Pain in left leg: Secondary | ICD-10-CM

## 2016-12-01 DIAGNOSIS — M79672 Pain in left foot: Secondary | ICD-10-CM | POA: Diagnosis not present

## 2016-12-01 MED ORDER — HYDROCODONE-ACETAMINOPHEN 7.5-325 MG PO TABS: 1.0000 | ORAL_TABLET | Freq: Four times a day (QID) | ORAL | 0 refills | Status: DC | PRN

## 2016-12-01 NOTE — Telephone Encounter (Signed)
Patient would like to know if he should stop taking xeljanz since Dr. French Ana is putting him on antibiotic.

## 2016-12-01 NOTE — Telephone Encounter (Signed)
Patient completely out of Hydrocodone. Patient will be in this area around lunch time. Please call when rx ready.

## 2016-12-01 NOTE — Telephone Encounter (Signed)
Patient advised not to take the Jeff Russell until he has completed his antibiotic. Patient also advised his prescription is ready for pick up.

## 2016-12-01 NOTE — Telephone Encounter (Signed)
ok 

## 2016-12-01 NOTE — Progress Notes (Addendum)
*  Preliminary Results* Left lower extremity venous duplex completed. Left lower extremity is negative for deep vein thrombosis. There is no evidence of left Baker's cyst.  Incidental finding: there are multiple heterogenous areas of bilateral groins, suggestive of prominent inguinal lymph nodes.  Preliminary results discussed with Dr. French Ana.  12/01/2016 4:38 PM  Maudry Mayhew, BS, RVT, RDCS, RDMS

## 2016-12-01 NOTE — Telephone Encounter (Signed)
Last Visit: 10/27/16 Next Visit: 01/14/17 UDS: 09/26/16 Narc Agreement: 10/29/15  Okay to refill Hydrocodone?

## 2016-12-02 ENCOUNTER — Other Ambulatory Visit: Payer: Self-pay | Admitting: Rheumatology

## 2016-12-04 DIAGNOSIS — M25562 Pain in left knee: Secondary | ICD-10-CM | POA: Diagnosis not present

## 2016-12-04 DIAGNOSIS — M25511 Pain in right shoulder: Secondary | ICD-10-CM | POA: Diagnosis not present

## 2016-12-09 NOTE — Telephone Encounter (Signed)
Last Visit: 10/27/16 Next Visit: 01/14/17  We have prescribed this for patient in the past. It was prescribed most recently after surgery by Dr. Christella Noa.  Okay to refill Flexeril?

## 2016-12-09 NOTE — Telephone Encounter (Signed)
ok 

## 2016-12-09 NOTE — Telephone Encounter (Signed)
Attempted to contact the patient and left message for patient to call the office.  

## 2016-12-11 NOTE — Telephone Encounter (Signed)
Merry Proud returned Andrea's call and states you can call him back or call the house and talk to his wife.

## 2016-12-11 NOTE — Telephone Encounter (Signed)
Attempted to contact the patient and left message for patient to call the office.  

## 2016-12-11 NOTE — Telephone Encounter (Signed)
Patient is not receiving prescription from Dr. Christella Noa and understands he may only get the prescriptions from here.

## 2016-12-19 ENCOUNTER — Other Ambulatory Visit: Payer: Self-pay | Admitting: Rheumatology

## 2016-12-22 NOTE — Telephone Encounter (Signed)
Last Visit: 10/27/16 Next Visit: 01/14/17 UDS: 09/26/16 Narc Agreement: 09/26/16  Okay to refill Tramadol?

## 2016-12-22 NOTE — Telephone Encounter (Signed)
ok 

## 2016-12-23 DIAGNOSIS — M4316 Spondylolisthesis, lumbar region: Secondary | ICD-10-CM | POA: Diagnosis not present

## 2016-12-23 DIAGNOSIS — R29898 Other symptoms and signs involving the musculoskeletal system: Secondary | ICD-10-CM | POA: Diagnosis not present

## 2016-12-31 ENCOUNTER — Other Ambulatory Visit: Payer: Self-pay | Admitting: Rheumatology

## 2016-12-31 MED ORDER — HYDROCODONE-ACETAMINOPHEN 7.5-325 MG PO TABS: 1.0000 | ORAL_TABLET | Freq: Four times a day (QID) | ORAL | 0 refills | Status: DC | PRN

## 2016-12-31 NOTE — Telephone Encounter (Signed)
Patient needs a refill on Hydrocodone. Please call wife to inform when ready to pick up.

## 2016-12-31 NOTE — Telephone Encounter (Signed)
Last Visit: 10/27/16 Next Visit: 01/14/17 UDS: 09/26/16 Narc Agreement: 09/26/16  Okay to refill Hydrocodone?

## 2016-12-31 NOTE — Telephone Encounter (Signed)
ok 

## 2017-01-02 ENCOUNTER — Other Ambulatory Visit: Payer: Self-pay | Admitting: Rheumatology

## 2017-01-02 DIAGNOSIS — Z79899 Other long term (current) drug therapy: Secondary | ICD-10-CM | POA: Diagnosis not present

## 2017-01-02 LAB — COMPLETE METABOLIC PANEL WITH GFR
AG Ratio: 1.5 (calc) (ref 1.0–2.5)
ALBUMIN MSPROF: 4.4 g/dL (ref 3.6–5.1)
ALT: 16 U/L (ref 9–46)
AST: 21 U/L (ref 10–35)
Alkaline phosphatase (APISO): 120 U/L — ABNORMAL HIGH (ref 40–115)
BUN: 14 mg/dL (ref 7–25)
CALCIUM: 9.8 mg/dL (ref 8.6–10.3)
CHLORIDE: 106 mmol/L (ref 98–110)
CO2: 26 mmol/L (ref 20–32)
Creat: 1.01 mg/dL (ref 0.70–1.33)
GFR, EST AFRICAN AMERICAN: 99 mL/min/{1.73_m2} (ref >=60)
GFR, EST NON AFRICAN AMERICAN: 86 mL/min/{1.73_m2} (ref >=60)
GLUCOSE: 107 mg/dL — AB (ref 65–99)
Globulin: 2.9 g/dL (calc) (ref 1.9–3.7)
Potassium: 4.8 mmol/L (ref 3.5–5.3)
Sodium: 140 mmol/L (ref 135–146)
TOTAL PROTEIN: 7.3 g/dL (ref 6.1–8.1)
Total Bilirubin: 0.4 mg/dL (ref 0.2–1.2)

## 2017-01-02 LAB — CBC WITH DIFFERENTIAL/PLATELET
BASOS ABS: 47 {cells}/uL (ref 0–200)
Basophils Relative: 0.6 %
EOS ABS: 205 {cells}/uL (ref 15–500)
EOS PCT: 2.6 %
HEMATOCRIT: 40.2 % (ref 38.5–50.0)
HEMOGLOBIN: 13.8 g/dL (ref 13.2–17.1)
LYMPHS ABS: 2939 {cells}/uL (ref 850–3900)
MCH: 32.5 pg (ref 27.0–33.0)
MCHC: 34.3 g/dL (ref 32.0–36.0)
MCV: 94.6 fL (ref 80.0–100.0)
MPV: 9.7 fL (ref 7.5–12.5)
Monocytes Relative: 5.9 %
NEUTROS PCT: 53.7 %
Neutro Abs: 4242 cells/uL (ref 1500–7800)
Platelets: 267 10*3/uL (ref 140–400)
RBC: 4.25 10*6/uL (ref 4.20–5.80)
RDW: 13.1 % (ref 11.0–15.0)
Total Lymphocyte: 37.2 %
WBC mixed population: 466 cells/uL (ref 200–950)
WBC: 7.9 10*3/uL (ref 3.8–10.8)

## 2017-01-02 NOTE — Telephone Encounter (Signed)
Last Visit: 10/27/16 Next Visit: 01/14/17  Okay to refill per [name]

## 2017-01-05 NOTE — Progress Notes (Signed)
Labs are stable.

## 2017-01-06 NOTE — Progress Notes (Signed)
  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 Sulfasalazine was added later. In 2012 methotrexate was added but discontinued due to elevation of LFTs. 2013 he was on combination of Arava plus Plaquenil and sulfasalazine. Once MRSA was cleared Enbrel was started in June 2014 . He was on Enbrel and Arava combination for almost 2 years. Eventually an Brohl quit working and in May 2016 Humira was started. He had inadequate response to Humira. September 2016 Orencia subcutaneous was started but discontinued due to rash. November 2016 he was started on Xeljanz XR along with sulfasalazine. He did really well on the combination without any active synovitis from almost a year. For some reason he discontinued sulfasalazine. And currently having a flare.  Today after ultrasound evaluation we discussed the findings of active synovitis and offered adding Azulfidine EN 500 mg 2 tablets by mouth twice a day again. He was in agreement. We will see how he does on the combination therapy over the next 3 months. We will see him back in 3 months. [name], MD

## 2017-01-07 ENCOUNTER — Other Ambulatory Visit: Payer: Self-pay | Admitting: Rheumatology

## 2017-01-07 DIAGNOSIS — R29898 Other symptoms and signs involving the musculoskeletal system: Secondary | ICD-10-CM | POA: Diagnosis not present

## 2017-01-07 NOTE — Telephone Encounter (Signed)
Last Visit: 10/27/16 Next Visit: 01/14/17 Labs: 01/02/17 Stable  Okay to refill per [name]

## 2017-01-12 ENCOUNTER — Other Ambulatory Visit: Payer: Self-pay | Admitting: Rheumatology

## 2017-01-12 DIAGNOSIS — R29898 Other symptoms and signs involving the musculoskeletal system: Secondary | ICD-10-CM | POA: Insufficient documentation

## 2017-01-12 NOTE — Telephone Encounter (Signed)
Last Visit: 10/27/16 Next Visit: 01/14/17  Okay to refill per [name]

## 2017-01-14 ENCOUNTER — Telehealth: Payer: Self-pay | Admitting: Radiology

## 2017-01-14 ENCOUNTER — Ambulatory Visit (INDEPENDENT_AMBULATORY_CARE_PROVIDER_SITE_OTHER): Payer: BLUE CROSS/BLUE SHIELD | Admitting: Rheumatology

## 2017-01-14 ENCOUNTER — Inpatient Hospital Stay (INDEPENDENT_AMBULATORY_CARE_PROVIDER_SITE_OTHER): Payer: BLUE CROSS/BLUE SHIELD

## 2017-01-14 DIAGNOSIS — M79642 Pain in left hand: Secondary | ICD-10-CM | POA: Diagnosis not present

## 2017-01-14 DIAGNOSIS — M79641 Pain in right hand: Secondary | ICD-10-CM | POA: Diagnosis not present

## 2017-01-14 MED ORDER — SULFASALAZINE 500 MG PO TBEC: 1000.0000 mg | DELAYED_RELEASE_TABLET | Freq: Two times a day (BID) | ORAL | 0 refills | Status: DC

## 2017-01-14 NOTE — Telephone Encounter (Signed)
Dr Estanislado Pandy writes his Hydrocodone for one at bedtime, but the last rx sig indicates one q6hrs Can you fix this with the next rx due end of the Month?

## 2017-01-14 NOTE — Telephone Encounter (Signed)
Will fix with next refill.

## 2017-01-14 NOTE — Patient Instructions (Signed)
Standing Labs We placed an order today for your standing lab work.    Please come back and get your standing labs in November and every 3 months  We have open lab Monday through Friday from 8:30-11:30 AM and 1:30-4 PM at the office of Dr. Yulitza Shorts.   The office is located at [address], [address], [place], [place] [address] No appointment is necessary.   Labs are drawn by [facility].  You may receive a bill from [facility] for your lab work. If you have any questions regarding directions or hours of operation,  please call [phone].    

## 2017-01-29 ENCOUNTER — Other Ambulatory Visit: Payer: Self-pay

## 2017-01-29 MED ORDER — HYDROCODONE-ACETAMINOPHEN 7.5-325 MG PO TABS: 1.0000 | ORAL_TABLET | Freq: Every day | ORAL | 0 refills | Status: DC

## 2017-01-29 NOTE — Addendum Note (Signed)
Addended by: Carole Binning on: 01/29/2017 09:53 AM   Modules accepted: Orders

## 2017-01-29 NOTE — Telephone Encounter (Signed)
Patient would like a Rx refill on Hydrocodone.  CB# is [phone].  Please advise.  Thank You.

## 2017-01-29 NOTE — Telephone Encounter (Signed)
ok 

## 2017-01-29 NOTE — Telephone Encounter (Signed)
Last Visit: 01/14/17 Next Visit: 04/15/17 UDS: 09/26/16 Narc Agreement: 09/26/16  Okay to refill Hydrocodone?

## 2017-02-03 DIAGNOSIS — H35033 Hypertensive retinopathy, bilateral: Secondary | ICD-10-CM | POA: Diagnosis not present

## 2017-02-04 ENCOUNTER — Other Ambulatory Visit: Payer: Self-pay | Admitting: Rheumatology

## 2017-02-05 NOTE — Telephone Encounter (Signed)
Last Visit: 01/14/17 Next Visit: 04/15/17 Labs: 01/02/17 Stable  Okay to refill per [name]

## 2017-02-06 ENCOUNTER — Other Ambulatory Visit: Payer: Self-pay | Admitting: Rheumatology

## 2017-02-06 NOTE — Telephone Encounter (Signed)
ok 

## 2017-02-06 NOTE — Telephone Encounter (Signed)
Last Visit: 01/14/17 Next Visit: 04/15/17 UDS: 09/26/16 Narc Agreement: 09/26/16  Okay to refill Tramadol?

## 2017-02-19 ENCOUNTER — Other Ambulatory Visit: Payer: Self-pay | Admitting: *Deleted

## 2017-02-19 MED ORDER — HYDROCODONE-ACETAMINOPHEN 7.5-325 MG PO TABS: 1.0000 | ORAL_TABLET | Freq: Every day | ORAL | 0 refills | Status: DC

## 2017-02-19 NOTE — Telephone Encounter (Signed)
ok 

## 2017-02-19 NOTE — Telephone Encounter (Signed)
Last Visit: 01/14/17 Next Visit: 04/15/17 UDS: 09/26/16 Narc Agreement: 09/26/16  Okay to refill Hydrocodone?

## 2017-02-20 DIAGNOSIS — B353 Tinea pedis: Secondary | ICD-10-CM | POA: Diagnosis not present

## 2017-02-20 DIAGNOSIS — L309 Dermatitis, unspecified: Secondary | ICD-10-CM | POA: Diagnosis not present

## 2017-03-02 ENCOUNTER — Ambulatory Visit: Payer: BLUE CROSS/BLUE SHIELD | Admitting: Rheumatology

## 2017-03-04 ENCOUNTER — Ambulatory Visit: Payer: BLUE CROSS/BLUE SHIELD | Admitting: Neurology

## 2017-03-04 ENCOUNTER — Encounter (INDEPENDENT_AMBULATORY_CARE_PROVIDER_SITE_OTHER): Payer: Self-pay

## 2017-03-04 VITALS — BP 109/64 | HR 54 | Ht 75.0 in | Wt 268.0 lb

## 2017-03-04 DIAGNOSIS — M6281 Muscle weakness (generalized): Secondary | ICD-10-CM

## 2017-03-04 DIAGNOSIS — R531 Weakness: Secondary | ICD-10-CM | POA: Diagnosis not present

## 2017-03-04 DIAGNOSIS — R269 Unspecified abnormalities of gait and mobility: Secondary | ICD-10-CM | POA: Insufficient documentation

## 2017-03-04 DIAGNOSIS — M545 Low back pain, unspecified: Secondary | ICD-10-CM | POA: Insufficient documentation

## 2017-03-04 NOTE — Progress Notes (Signed)
PATIENT: Jeff Russell DOB: 05-Sep-1965  Chief Complaint  Patient presents with  . New Patient (Initial Visit)    Referral from Dr Ashok Pall from [facility] for severe lower extremity weakness in both legs     HISTORICAL  Jeff Russell is a 51 year old male, seen in refer by neurosurgeon Dr. 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, taking sulfasalazine 500 mg 2 tablets twice a day, with intermittent flareup of joint pain.  Since summer 2017, he noticed gradual onset bilateral lower extremity weakness, when he stepped out of his lawn more, he noticed stumbling, unsteady gait, also gradual worsening chronic low back pain, radiating pain to bilateral lower extremity, he has tried different medications including tramadol, hydrocodone, epidural injection which only provide temporary relief of his low back pain,  At the same time, he noticed gradual worsening bilateral lower extremity weakness, fell multiple times, he denies numbness, no neck pain, no bowel bladder incontinence,  He was evaluated by Dr. Christella Noa on May 27, 2016, he was noted to have profound bilateral hip flexion weakness, bilateral thigh abductor, adductor weakness, preserved motor strength at bilateral knee extension, hamstring and distal leg muscle.  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 Christella Noa on July 16, 2016, which has helped his low back pain significantly, post surgically, he seems to have a period of improved bilateral lower extremity weakness, but not long-lasting,  By September 2018, he noticed gradual worsening bilateral lower extremity weakness, slight worsening compared to presurgical level, returning of mild midline low back pain 3 out of 10, left heel pain, as if he has a nail going up his left heel, gait abnormality,  He denies upper extremity weakness or numbness, denies bowel and bladder incontinence.  EMG nerve conduction study on January 07, 2017 by Dr. Marlaine Hind reviewed  Normal sural sensory response, H-reflex, tibial motor conduction study, peroneal motor conduction study, EMG nerve conduction study showed no denervation potential, no morphology changes, but there was decreased recruitment pattern noticed at all the limb muscle tested, there was a concern of upper motor neuron involvement, he is referred here for further evaluation  MRI of left foot in February 2018 showed severe tendinosis of the peroneal brevis just distal to the lateral malleolus with a short segment longitudinal split tear, moderately severe peroneal tenosynovial I does, mild plantar fasciitis of the medial band of the plantar fascia without tear,  Laboratory evaluation in February 2018 normal CBC, CMP, with exception of mild elevated alkaline phosphate 120, triglyceride was mildly elevated at 178, LDL 88,  REVIEW OF SYSTEMS: Full 14 system review of systems performed and notable only for weakness, not enough sleep, joint pain, joint swelling, cramps, achy muscles  ALLERGIES: Allergies  Allergen Reactions  . Doxycycline Other (See Comments)    "burns his face" (Sun Sensitivity?)  . Humira [Adalimumab] Swelling and Rash    SWELLING REACTION UNSPECIFIED  HOME MEDICATIONS: Current Outpatient  Medications  Medication Sig Dispense Refill  . acetaminophen (TYLENOL ARTHRITIS PAIN) 650 MG CR tablet Take 1,300 mg by mouth 2 (two) times daily as needed for pain.     . cyclobenzaprine (FLEXERIL) 10 MG tablet Take 1 tablet (10 mg total) by mouth 3 (three) times daily as needed for muscle spasms. 90 tablet 0  . cyclobenzaprine (FLEXERIL) 10 MG tablet TAKE 1/2 TO 1 TABLET BY MOUTH EVERY DAY AT BEDTIME AS NEEDED 90 tablet 0  . esomeprazole (NEXIUM) 20 MG capsule Take 20 mg by mouth daily before breakfast.    . fluticasone (FLONASE) 50 MCG/ACT nasal spray Place 2 sprays into both nostrils daily as needed for allergies or rhinitis.    Marland Kitchen gabapentin (NEURONTIN) 300 MG capsule TAKE 1 CAPSULE BY MOUTH 3 TIMES DAILY 270 capsule 0  . HYDROcodone-acetaminophen (NORCO) 7.5-325 MG tablet Take 1 tablet at bedtime by mouth. 30 tablet 0  . methocarbamol (ROBAXIN) 500 MG tablet TAKE ONE (1) TABLET BY MOUTH THREE (3) TIMES EACH DAY *DO NOT TAKE WITH CYCLOBENZAPRINE* 90 tablet 0  . predniSONE (DELTASONE) 5 MG tablet 4 tabs po x 2 days, 3 tabs po x 2 days, 2  tabs po x 2 days, 1  tab po x 2 days 20 tablet 0  . sulfaSALAzine (AZULFIDINE) 500 MG EC tablet Take 2 tablets (1,000 mg total) by mouth 2 (two) times daily. 360 tablet 0  . traMADol (ULTRAM) 50 MG tablet TAKE 1 TO 2 TABLETS BY MOUTH 3 TIMES DAILY AS NEEDED 180 tablet 0  . XELJANZ XR 11 MG TB24 TAKE ONE TABLET BY MOUTH EVERY DAY 30 tablet 0   Current Facility-Administered Medications  Medication Dose Route Frequency Provider Last Rate Last Dose  . 0.9 %  sodium chloride infusion  500 mL Intravenous Continuous Doran Stabler, MD        PAST MEDICAL HISTORY: Past Medical History:  Diagnosis Date  . Allergy   . Arthritis   . COPD (chronic obstructive pulmonary disease) ([facility])    per pt  . GERD (gastroesophageal reflux disease)   . History of MRSA infection    right leg per patient  . MSSA (methicillin susceptible Staphylococcus aureus) infection  05/01/2011  . Pneumonia   . Rheumatoid nodules ([facility]) 08/06/2014   right lung-per CT scan ordered by Dr Estanislado Pandy  . Septic bursitis 05/01/2011  . Shortness of breath    occ    PAST SURGICAL HISTORY: Past Surgical History:  Procedure Laterality Date  . BACK SURGERY    . carpel tunnel Bilateral   . CHOLECYSTECTOMY    . COLONOSCOPY    . EPIDURAL BLOCK INJECTION    . EYE SURGERY Left    wire in eye  . I&D EXTREMITY  03/24/2011   Procedure: IRRIGATION AND DEBRIDEMENT EXTREMITY;  Surgeon: Yvette Rack., MD;  Location: [facility];  Service: Orthopedics;  Laterality: Right;  Irrigation and debridement right knee abcess and Bursa,  application of wound vac  . I&D EXTREMITY  03/28/2011   Procedure: IRRIGATION AND DEBRIDEMENT EXTREMITY;  Surgeon: Yvette Rack., MD;  Location: [facility];  Service: Orthopedics;  Laterality: Right;  I&D right knee with wound closure  . MENISCUS REPAIR Right 2017    FAMILY HISTORY: Family History  Problem Relation Age of Onset  . Arthritis Maternal Grandmother   . Angina Maternal Grandmother   . Colon cancer Paternal Grandfather        deceased  .  Prostate cancer Father   . Hypercholesterolemia Mother   . Heart disease Mother        palpitations, ?heart failure  . Hypertension Mother   . Other Mother        Heart enlargement,left ventricle problems  . Congestive Heart Failure Paternal Grandmother   . Esophageal cancer Neg Hx   . Rectal cancer Neg Hx   . Stomach cancer Neg Hx     SOCIAL HISTORY:  Social History   Socioeconomic History  . Marital status: Married    Spouse name: Not on file  . Number of children: Not on file  . Years of education: Not on file  . Highest education level: Not on file  Social Needs  . Financial resource strain: Not on file  . Food insecurity - worry: Not on file  . Food insecurity - inability: Not on file  . Transportation needs - medical: Not on file  . Transportation needs - non-medical: Not on file  Occupational  History  . Not on file  Tobacco Use  . Smoking status: Current Every Day Smoker    Packs/day: 2.00    Years: 30.00    Pack years: 60.00    Types: Cigarettes  . Smokeless tobacco: Never Used  Substance and Sexual Activity  . Alcohol use: No    Comment: quit 20 yrs ago  . Drug use: No  . Sexual activity: Yes  Other Topics Concern  . Not on file  Social History Narrative   Work or School: lawn care      Home Situation: lives with wife whom sees me as well       Spiritual Beliefs: believes in God; does not go to church      Lifestyle: no CV exercise - but physically active at work; diet is poor        PHYSICAL EXAM   Vitals:   03/04/17 0735  Weight: 268 lb (121.6 kg)  Height: 6\' 3"  (1.905 m)    Not recorded      Body mass index is 33.5 kg/m.  PHYSICAL EXAMNIATION:  Gen: NAD, conversant, well nourised, obese, well groomed                     Cardiovascular: Regular rate rhythm, no peripheral edema, warm, nontender. Eyes: Conjunctivae clear without exudates or hemorrhage Neck: Supple, no carotid bruits. Pulmonary: Clear to auscultation bilaterally   NEUROLOGICAL EXAM:  MENTAL STATUS: Speech:    Speech is normal; fluent and spontaneous with normal comprehension.  Cognition:     Orientation to time, place and person     Normal recent and remote memory     Normal Attention span and concentration     Normal Language, naming, repeating,spontaneous speech     Fund of knowledge   CRANIAL NERVES: CN II: Visual fields are full to confrontation. Fundoscopic exam is normal with sharp discs and no vascular changes. Pupils are round equal and briskly reactive to light. CN III, IV, VI: extraocular movement are normal. No ptosis. CN V: Facial sensation is intact to pinprick in all 3 divisions bilaterally. Corneal responses are intact.  CN VII: Face is symmetric with normal eye closure and smile. CN VIII: Hearing is normal to rubbing fingers CN IX, X: Palate elevates  symmetrically. Phonation is normal. CN XI: Head turning and shoulder shrug are intact CN XII: Tongue is midline with normal movements and no atrophy.  MOTOR: He has mild bilateral shoulder abduction, external rotation,  elbow flexion weakness, there was no muscle atrophy, fasciculation noted, no distal upper extremity muscle weakness.  He has mild to moderate bilateral hip flexion, thigh abduction, adduction weakness, there was no knee flexion, extension, weakness, there was no significant ankle dorsiflexion or plantarflexion weakness.  REFLEXES: Reflexes are 2+ and symmetric at the biceps, triceps, knees, and ankles. Plantar responses are flexor.  SENSORY: Intact to light touch, pinprick, positional sensation and vibratory sensation are intact in fingers and toes.  COORDINATION: Rapid alternating movements and fine finger movements are intact. There is no dysmetria on finger-to-nose and heel-knee-shin.    GAIT/STANCE: He needs to push up to get up from seated position, wide-based, antalgic, mild cautious gait,  DIAGNOSTIC DATA (LABS, IMAGING, TESTING) - I reviewed patient records, labs, notes, testing and imaging myself where available.   ASSESSMENT AND PLAN  Jeff Russell is a 51 y.o. male    Progressive worsening bilateral lower extremity weakness,  Need to rule out inflammatory myopathy, neuromuscular junctional disorder, cervical/thoracic myelopathy  Proceed with MRI of cervical spine, thoracic spine  Laboratory evaluations  Repeat electrodiagnostic study   Marcial Pacas, M.D. Ph.D.  [facility] [address], [place], [place] [address] Ph: [phone] Fax: [phone]  CC: Ashok Pall, MD

## 2017-03-05 ENCOUNTER — Telehealth: Payer: Self-pay | Admitting: Radiology

## 2017-03-05 NOTE — Telephone Encounter (Signed)
Pt called he was advised by his insurance company he could have MRI cheaper at [facility] office. He is requesting Raquel Sarna to call and speak with his wife-Brenda about this

## 2017-03-05 NOTE — Telephone Encounter (Signed)
I spoke to the patient and informed him that his out pocket has been met so he wouldn't pay anything he stated that he would like to keep it as it is scheduled at [facility].  °

## 2017-03-06 ENCOUNTER — Encounter: Payer: Self-pay | Admitting: Neurology

## 2017-03-06 DIAGNOSIS — M6281 Muscle weakness (generalized): Secondary | ICD-10-CM | POA: Insufficient documentation

## 2017-03-10 ENCOUNTER — Ambulatory Visit (INDEPENDENT_AMBULATORY_CARE_PROVIDER_SITE_OTHER): Payer: BLUE CROSS/BLUE SHIELD

## 2017-03-10 ENCOUNTER — Telehealth: Payer: Self-pay | Admitting: Neurology

## 2017-03-10 ENCOUNTER — Encounter: Payer: Self-pay | Admitting: *Deleted

## 2017-03-10 DIAGNOSIS — M6281 Muscle weakness (generalized): Secondary | ICD-10-CM

## 2017-03-10 LAB — HGB A1C W/O EAG: HEMOGLOBIN A1C: 5 % (ref 4.8–5.6)

## 2017-03-10 LAB — MYASTHENIA GRAVIS FULL PANEL
ACETYLCHOL BLOCK AB: 17 % (ref 0–25)
Acetylcholine Modulat Ab: <12 % (ref 0–20)
Anti-striation Abs: NEGATIVE

## 2017-03-10 LAB — VITAMIN B12: Vitamin B-12: 386 pg/mL (ref 232–1245)

## 2017-03-10 LAB — COPPER, SERUM: Copper: 85 ug/dL (ref 72–166)

## 2017-03-10 LAB — SEDIMENTATION RATE: SED RATE: 24 mm/h (ref 0–30)

## 2017-03-10 LAB — VITAMIN D 25 HYDROXY (VIT D DEFICIENCY, FRACTURES): VIT D 25 HYDROXY: 27.2 ng/mL — AB (ref 30.0–100.0)

## 2017-03-10 LAB — ANA W/REFLEX: Anti Nuclear Antibody(ANA): NEGATIVE

## 2017-03-10 LAB — HIV ANTIBODY (ROUTINE TESTING W REFLEX): HIV SCREEN 4TH GENERATION: NONREACTIVE

## 2017-03-10 LAB — TSH: TSH: 1.98 u[IU]/mL (ref 0.450–4.500)

## 2017-03-10 LAB — RPR: RPR: NONREACTIVE

## 2017-03-10 LAB — CK: CK TOTAL: 261 U/L — AB (ref 24–204)

## 2017-03-10 LAB — C-REACTIVE PROTEIN: CRP: 6 mg/L — ABNORMAL HIGH (ref 0.0–4.9)

## 2017-03-10 NOTE — Telephone Encounter (Signed)
Please call patient vitamin D level was 27 he should take vitamin D3 supplement 1000 units daily,  Rest of the laboratory evaluation showed no significant abnormality,

## 2017-03-10 NOTE — Telephone Encounter (Signed)
Spoke to his wife on HIPAA - she is aware of the results and will purchase the recommended vitamin D3 supplement.

## 2017-03-12 ENCOUNTER — Telehealth: Payer: Self-pay | Admitting: Neurology

## 2017-03-12 ENCOUNTER — Other Ambulatory Visit: Payer: Self-pay | Admitting: *Deleted

## 2017-03-12 MED ORDER — TOFACITINIB CITRATE ER 11 MG PO TB24: 1.0000 | ORAL_TABLET | Freq: Every day | ORAL | 0 refills | Status: DC

## 2017-03-12 NOTE — Telephone Encounter (Signed)
Spoke to his wife on HIPAA - she is aware of results.  He will keep his pending appt for NCV/EMG on 03/27/17.

## 2017-03-12 NOTE — Telephone Encounter (Signed)
Refill Request received via fax  Last Visit: 01/14/17 Next Visit: 04/15/17 Labs: 01/02/17 Stable  Okay to refill per [name]

## 2017-03-12 NOTE — Telephone Encounter (Signed)
Please call patient, MRI of the cervical spine, and thoracic spine showed evidence of degenerative changes, but there was no significant canal, or foraminal stenosis.

## 2017-03-16 ENCOUNTER — Other Ambulatory Visit: Payer: Self-pay | Admitting: Rheumatology

## 2017-03-17 ENCOUNTER — Ambulatory Visit: Payer: BLUE CROSS/BLUE SHIELD | Admitting: Internal Medicine

## 2017-03-18 NOTE — Telephone Encounter (Signed)
Last Visit: 01/14/17 Next Visit: 04/15/17  Okay to refill per [name]

## 2017-03-27 ENCOUNTER — Ambulatory Visit (INDEPENDENT_AMBULATORY_CARE_PROVIDER_SITE_OTHER): Payer: BLUE CROSS/BLUE SHIELD | Admitting: Neurology

## 2017-03-27 ENCOUNTER — Telehealth: Payer: Self-pay | Admitting: Neurology

## 2017-03-27 ENCOUNTER — Other Ambulatory Visit: Payer: Self-pay | Admitting: Neurology

## 2017-03-27 ENCOUNTER — Ambulatory Visit
Admission: RE | Admit: 2017-03-27 | Discharge: 2017-03-27 | Disposition: A | Discharge status: Home / Self Care | Payer: BLUE CROSS/BLUE SHIELD | Source: Ambulatory Visit | Attending: Neurology | Admitting: Neurology

## 2017-03-27 DIAGNOSIS — R269 Unspecified abnormalities of gait and mobility: Secondary | ICD-10-CM | POA: Diagnosis not present

## 2017-03-27 DIAGNOSIS — M545 Low back pain, unspecified: Secondary | ICD-10-CM

## 2017-03-27 DIAGNOSIS — G3281 Cerebellar ataxia in diseases classified elsewhere: Secondary | ICD-10-CM

## 2017-03-27 DIAGNOSIS — M6281 Muscle weakness (generalized): Secondary | ICD-10-CM

## 2017-03-27 DIAGNOSIS — R531 Weakness: Secondary | ICD-10-CM

## 2017-03-27 DIAGNOSIS — Z0289 Encounter for other administrative examinations: Secondary | ICD-10-CM

## 2017-03-27 DIAGNOSIS — R200 Anesthesia of skin: Secondary | ICD-10-CM | POA: Diagnosis not present

## 2017-03-27 NOTE — Procedures (Signed)
Full Name: Jeff Russell Gender: Male MRN #: [id number] Date of Birth: 09/22/1965    Visit Date: 03/27/2017 09:57 Age: 51 Years 46 Months Old Examining Physician: Marcial Pacas, MD  Referring Physician: Krista Blue, MD History: 51 year old male, presented with gradual onset gait abnormality, subjective bilateral lower extremity weakness since summer 2017, despite lumbar decompression surgery, he continues to have significant unsteady gait,  Summary of the test  Nerve conduction study: Bilateral sural, superficial peroneal sensory responses showed moderately decreased snap amplitude.  Bilateral tibial motor responses showed moderately decreased C map amplitude, with mild slow conduction velocity.  Bilateral tibial F-wave latency was significantly prolonged  Left peroneal to EDB motor responses showed mildly decreased C map amplitude at proximal stimulation side, with mild slow conduction velocity.  Right peroneal to EDB motor responses also showed mildly decreased conduction velocity, but normal distal latency, C map amplitude.  Electromyography: Selective needle examinations were performed at bilateral lower extremity muscles, bilateral lumbosacral paraspinal muscles, right upper extremity muscle, right cervical paraspinal muscles.  There was no significant abnormality found.   Conclusion: This is an abnormal study.  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  There is no evidence of bilateral lumbosacral radiculopathy, no evidence of right cervical radiculopathy, there is no evidence of inflammatory myopathy.    ------------------------------- Marcial Pacas, M.D.  [facility] [place], [place] [address] Tel: [phone] Fax:  [phone]        [facility]    Nerve / Sites Muscle Latency Ref. Amplitude Ref. Rel Amp Segments Distance Velocity Ref. Area    ms ms mV mV %  cm m/s m/s mVms  R Median - APB     Wrist APB 3.5 ?4.4 4.7 ?4.0 100 Wrist - APB 7   19.7     Upper arm APB 8.4  4.4  95 Upper arm - Wrist 26 54 ?49 17.7  R Ulnar - ADM     Wrist ADM 2.6 ?3.3 6.1 ?6.0 100 Wrist - ADM 7   18.1     B.Elbow ADM 7.0  5.5  89.8 B.Elbow - Wrist 24 54 ?49 16.3     A.Elbow ADM 8.8  5.4  97.6 A.Elbow - B.Elbow 10 58 ?49 16.1         A.Elbow - Wrist      R Peroneal - EDB     Ankle EDB 4.8 ?6.5 3.1 ?2.0 100 Ankle - EDB 9   8.7     Fib head EDB 14.2  2.5  80.5 Fib head - Ankle 37 39 ?44 8.3     Pop fossa EDB 16.7  2.4  95.9 Pop fossa - Fib head 10 40 ?44 8.4         Pop fossa - Ankle      L Peroneal - EDB     Ankle EDB 4.4 ?6.5 2.0 ?2.0 100 Ankle - EDB 9   5.5     Fib head EDB 15.1  1.6  79.5 Fib head - Ankle 37 35 ?44 5.0     Pop fossa EDB 17.8  1.6  98.6 Pop fossa - Fib head 10 36 ?44 4.7         Pop fossa - Ankle      R Tibial - AH  Ankle AH 4.6 ?5.8 2.6 ?4.0 100 Ankle - AH 9   10.4     Pop fossa AH 16.3  2.0  74.4 Pop fossa - Ankle 42 36 ?41 7.9  L Tibial - AH     Ankle AH 5.0 ?5.8 2.6 ?4.0 100 Ankle - AH 9   6.7     Pop fossa AH 17.6  2.0  77.7 Pop fossa - Ankle 42 33 ?41 5.7                 SNC    Nerve / Sites Rec. Site Peak Lat Ref.  Amp Ref. Segments Distance    ms ms V V  cm  R Radial - Anatomical snuff box (Forearm)     Forearm Wrist 2.2 ?2.9 10 ?15 Forearm - Wrist 10  R Sural - Ankle (Calf)     Calf Ankle 3.4 ?4.4 3 ?6 Calf - Ankle 14  L Sural - Ankle (Calf)     Calf Ankle 3.5 ?4.4 3 ?6 Calf - Ankle 14  L Superficial peroneal - Ankle     Lat leg Ankle 4.0 ?4.4 3 ?6 Lat leg - Ankle 14  R Superficial peroneal - Ankle     Lat leg Ankle 4.1 ?4.4 3 ?6 Lat leg - Ankle 14  R Median - Orthodromic (Dig II, Mid palm)     Dig II Wrist 2.8 ?3.4 11 ?10 Dig II - Wrist 13  R Ulnar - Orthodromic, (Dig V, Mid  palm)     Dig V Wrist 2.4 ?3.1 6 ?5 Dig V - Wrist 39                   F  Wave    Nerve F Lat Ref.   ms ms  R Tibial - AH 65.3 ?56.0  L Tibial - AH 66.1 ?56.0  R Ulnar - ADM 30.5 ?32.0           EMG full       EMG Summary Table    Spontaneous MUAP Recruitment  Muscle IA Fib PSW Fasc Other Amp Dur. Poly Pattern  R. Tibialis anterior Normal None None None _______ Normal Normal Normal Normal  R. Gastrocnemius (Medial head) Normal None None None _______ Normal Normal Normal Normal  R. Vastus lateralis Normal None None None _______ Normal Normal Normal Normal  R. Iliopsoas Normal None None None _______ Normal Normal Normal Normal  R. Adductor longus Normal None None None _______ Normal Normal Normal Normal  L. Tibialis anterior Normal None None None _______ Normal Normal Normal Normal  L. Gastrocnemius (Medial head) Normal None None None _______ Normal Normal Normal Normal  L. Vastus lateralis Normal None None None _______ Normal Normal Normal Normal  L. Iliopsoas Normal None None None _______ Normal Normal Normal Normal  L. Lumbar paraspinals (mid) Normal None None None _______ Normal Normal Normal Normal  L. Lumbar paraspinals (low) Normal None None None _______ Normal Normal Normal Normal  R. Lumbar paraspinals (mid) Normal None None None _______ Normal Normal Normal Normal  R. Lumbar paraspinals (low) Normal None None None _______ Normal Normal Normal Normal  R. First dorsal interosseous Normal None None None _______ Normal Normal Normal Normal  R. Pronator teres Normal None None None _______ Normal Normal Normal Normal  R. Brachioradialis Normal None None None _______ Normal Normal Normal Normal  R. Deltoid Normal None None None _______ Normal Normal Normal Normal  R. Biceps brachii Normal None None None _______  Normal Normal Normal Normal  R. Cervical paraspinals Normal None None None _______ Normal Normal Normal Normal

## 2017-03-27 NOTE — Progress Notes (Signed)
PATIENT: Jeff Russell DOB: 07-22-1965  No chief complaint on file.    HISTORICAL  Jeff Russell is a 51 year old male, seen in refer by neurosurgeon Dr. 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, taking sulfasalazine 500 mg 2 tablets twice a day, with intermittent flareup of joint pain.  Since summer 2017, he noticed gradual onset bilateral lower extremity weakness, when he stepped out of his lawn more, he noticed stumbling, unsteady gait, also gradual worsening chronic low back pain, radiating pain to bilateral lower extremity, he has tried different medications including tramadol, hydrocodone, epidural injection which only provide temporary relief of his low back pain,  At the same time, he noticed gradual worsening bilateral lower extremity weakness, fell multiple times, he denies numbness, no neck pain, no bowel bladder incontinence,  He was evaluated by Dr. Christella Noa on May 27, 2016, he was noted to have profound bilateral hip flexion weakness, bilateral thigh abductor, adductor weakness, preserved motor strength at bilateral knee extension, hamstring and distal leg muscle.  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 Christella Noa on July 16, 2016, which has helped his low back pain significantly,  post surgically, he seems to have a period of improved bilateral lower extremity weakness, but not long-lasting,  By September 2018, he noticed gradual worsening bilateral lower extremity weakness, slight worsening compared to presurgical level, returning of mild midline low back pain 3 out of 10, left heel pain, as if he has a nail going up his left heel, gait abnormality,  He denies upper extremity weakness or numbness, denies bowel and bladder incontinence.  EMG nerve conduction study on January 07, 2017 by Dr. Marlaine Hind reviewed  Normal sural sensory response, H-reflex, tibial motor conduction study, peroneal motor conduction study, EMG nerve conduction study showed no denervation potential, no morphology changes, but there was decreased recruitment pattern noticed at all the limb muscle tested, there was a concern of upper motor neuron involvement, he is referred here for further evaluation  MRI of left foot in February 2018 showed severe tendinosis of the peroneal brevis just distal to the lateral malleolus with a short segment longitudinal split tear, moderately severe peroneal tenosynovial I does, mild plantar fasciitis of the medial band of the plantar fascia without tear,  Laboratory evaluation in February 2018 normal CBC, CMP, with exception of mild elevated alkaline phosphate 120, triglyceride was mildly elevated at 178, LDL 88,  Update March 27, 2017: We have personally reviewed MRI of cervical spine: Multilevel degenerative changes, mild canal stenosis, no cord signal change, or compression, MRI of thoracic spine showed mild degenerative changes, no evidence of canal stenosis,  Extensive evaluations, showed negative myasthenia gravis panel, A1c 5.0, normal ESR, TSH, B12, CPK level was mildly elevated 261, vitamin D level was 27.2, negative RPR, HIV, mild elevated C-reactive protein 6, level was nor1, negative ANA,  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  Continue complains of multiple bilateral hands joint pain, mild low back pain, lower extremity deep achy pain, gait abnormality, but denies upper extremity sensorimotor deficit.   REVIEW OF SYSTEMS: Full 14 system review of systems performed and notable only for weakness, not enough sleep, joint pain, joint swelling, cramps, achy muscles  ALLERGIES: Allergies  Allergen Reactions  . Doxycycline Other (See Comments)    "burns his face" (Sun Sensitivity?)  . Humira [Adalimumab] Swelling and Rash    SWELLING REACTION UNSPECIFIED     HOME MEDICATIONS: Current Outpatient Medications  Medication Sig Dispense Refill  . acetaminophen (TYLENOL ARTHRITIS PAIN) 650 MG CR tablet Take 1,300 mg by mouth 2 (two) times daily as needed for pain.     . Cholecalciferol (VITAMIN D3) 1000 units CAPS Take 1,000 Units by mouth daily.    . cyclobenzaprine (FLEXERIL) 10 MG tablet Take 1 tablet (10 mg total) by mouth 3 (three) times daily as needed for muscle spasms. 90 tablet 0  . cyclobenzaprine (FLEXERIL) 10 MG tablet TAKE 1/2 TO 1 TABLET BY MOUTH EVERY DAY AT BEDTIME AS NEEDED 90 tablet 0  . esomeprazole (NEXIUM) 20 MG capsule Take 20 mg by mouth daily before breakfast.    . fluticasone (FLONASE) 50 MCG/ACT nasal spray Place 2 sprays into both nostrils daily as needed for allergies or rhinitis.    Marland Kitchen gabapentin (NEURONTIN) 300 MG capsule TAKE 1 CAPSULE BY MOUTH 3 TIMES DAILY 270 capsule 0  . HYDROcodone-acetaminophen (NORCO) 7.5-325 MG tablet Take 1 tablet at bedtime by mouth. 30 tablet 0  . methocarbamol (ROBAXIN) 500 MG tablet TAKE 1 TABLET BY MOUTH 3 TIMES DAILY EACH DAY, DO NOT TAKE WITH CYCLOBENZAPRINE 90 tablet 0  . sulfaSALAzine (AZULFIDINE) 500 MG EC tablet Take 2 tablets (1,000 mg total) by mouth 2 (two) times daily. 360 tablet 0  . Tofacitinib  Citrate (XELJANZ XR) 11 MG TB24 Take 1 tablet by mouth daily. 90 tablet 0  . traMADol (ULTRAM) 50 MG tablet TAKE 1 TO 2 TABLETS BY MOUTH 3 TIMES DAILY AS NEEDED 180 tablet 0   No current facility-administered medications for this visit.     PAST MEDICAL HISTORY: Past Medical History:  Diagnosis Date  . Allergy   . Arthritis   . COPD (chronic obstructive pulmonary disease) ([place])    per pt  . GERD (gastroesophageal reflux disease)   . History of MRSA infection    right leg per patient  . MSSA (methicillin susceptible Staphylococcus aureus) infection 05/01/2011  . Pneumonia   . Rheumatoid nodules ([place]) 08/06/2014   right lung-per CT scan ordered by Dr Estanislado Pandy  . Septic bursitis 05/01/2011  . Shortness of breath    occ    PAST SURGICAL HISTORY: Past Surgical History:  Procedure Laterality Date  . BACK SURGERY    . carpel tunnel Bilateral   . CHOLECYSTECTOMY    . COLONOSCOPY    . EPIDURAL BLOCK INJECTION    . EYE SURGERY Left    wire in eye  . I&D EXTREMITY  03/24/2011   Procedure: IRRIGATION AND DEBRIDEMENT EXTREMITY;  Surgeon: Yvette Rack., MD;  Location: [facility];  Service: Orthopedics;  Laterality: Right;  Irrigation and debridement right knee abcess and Bursa,  application of wound vac  . I&D EXTREMITY  03/28/2011   Procedure: IRRIGATION AND DEBRIDEMENT EXTREMITY;  Surgeon: Yvette Rack., MD;  Location[facility];  Service: Orthopedics;  Laterality: Right;  I&D right knee with wound closure  . MENISCUS REPAIR Right 2017    FAMILY HISTORY: Family History  Problem Relation Age of Onset  . Arthritis Maternal Grandmother   . Angina Maternal Grandmother   . Colon cancer Paternal Grandfather        deceased  . Prostate cancer Father   . Hypercholesterolemia Mother   . Heart disease Mother        palpitations, ?heart failure  . Hypertension Mother   . Other Mother        Heart enlargement,left ventricle problems  . Congestive Heart Failure Paternal Grandmother   .  Esophageal cancer Neg Hx   . Rectal cancer Neg Hx   . Stomach cancer Neg Hx     SOCIAL HISTORY:  Social History   Socioeconomic History  . Marital status: Married    Spouse name: Not on file  . Number of children: Not on file  . Years of education: Not on file  . Highest education level: Not on file  Social Needs  . Financial resource strain: Not on file  . Food insecurity - worry: Not on file  . Food insecurity - inability: Not on file  . Transportation needs - medical: Not on file  . Transportation needs - non-medical: Not on file  Occupational History  . Not on file  Tobacco Use  . Smoking status: Current Every Day Smoker    Packs/day: 2.00    Years: 30.00    Pack years: 60.00    Types: Cigarettes  . Smokeless tobacco: Never Used  Substance and Sexual Activity  . Alcohol use: No    Comment: quit 20 yrs ago  . Drug use: No  . Sexual activity: Yes  Other Topics Concern  . Not on file  Social History Narrative   Work or School: lawn care      Home Situation: lives with wife whom sees me as well       Spiritual Beliefs: believes in God; does not go to church      Lifestyle: no CV exercise - but physically active at work; diet is poor        PHYSICAL EXAM   There were no vitals filed for this visit.  Not recorded      There is no height or weight on file to calculate BMI.  PHYSICAL EXAMNIATION:  Gen: NAD, conversant, well nourised, obese, well groomed                     Cardiovascular: Regular rate rhythm, no peripheral edema, warm, nontender. Eyes: Conjunctivae clear without exudates or hemorrhage Neck: Supple, no carotid bruits. Pulmonary: Clear to auscultation bilaterally   NEUROLOGICAL EXAM:  MENTAL STATUS: Speech:    Speech is normal; fluent and spontaneous with normal comprehension.  Cognition:     Orientation to time, place and person     Normal recent and remote memory     Normal Attention span and concentration     Normal Language,  naming, repeating,spontaneous speech     Fund of knowledge   CRANIAL NERVES: CN II: Visual fields are full to confrontation. Fundoscopic exam is normal with sharp discs and no vascular changes. Pupils are round equal and briskly reactive to light. CN III, IV, VI: extraocular movement are normal. No ptosis. CN V: Facial sensation is intact to pinprick in all 3 divisions bilaterally. Corneal responses are intact.  CN VII: Face  is symmetric with normal eye closure and smile. CN VIII: Hearing is normal to rubbing fingers CN IX, X: Palate elevates symmetrically. Phonation is normal. CN XI: Head turning and shoulder shrug are intact CN XII: Tongue is midline with normal movements and no atrophy.  MOTOR: He has bilateral hand muscle erythematous, swelling, but I did not see any significant bilateral upper extremity proximal distal muscle wasting, weakness  With multiple encouragement, there was no significant bilateral hip flexion weakness noted, there was no significant bilateral distal leg weakness.   REFLEXES: Reflexes are 2+ and symmetric at the biceps, triceps, absent at knees, and ankles. Plantar responses are flexor.  SENSORY: Mildly decreased to light touch, pinprick, and vibratory sensation at the toes.  COORDINATION: Rapid alternating movements and fine finger movements are intact. There is no dysmetria on finger-to-nose and heel-knee-shin.    GAIT/STANCE: He needs to push up to get up from seated position, wide-based, antalgic, mild cautious gait,  DIAGNOSTIC DATA (LABS, IMAGING, TESTING) - I reviewed patient records, labs, notes, testing and imaging myself where available.   ASSESSMENT AND PLAN  Jeff Russell is a 51 y.o. male    Progressive worsening bilateral lower extremity weakness, gait abnormality  His gait abnormality likely multifactorial, this can be due to his low back pain, multiple joints pain, left foot pain,  Could not rule out possibility of demyelinating  neuropathy due to significantly prolonged bilateral tibial F-wave latency, absent bilateral lower extremity reflexes, proceed with lumbar puncture,  Also proceed with MRI of brain to rule out central nervous system etiology  X-ray of bilateral hip   Marcial Pacas, M.D. Ph.D.  [facility] [address], [place], [place] [address] Ph: [phone] Fax: [phone]  CC: Ashok Pall, MD

## 2017-03-27 NOTE — Telephone Encounter (Signed)
Please call pt to schedule 4 week f/u per Dr. Krista Blue. Pt. Needs to be fit in. Thank you. JBA

## 2017-04-01 ENCOUNTER — Telehealth: Payer: Self-pay | Admitting: *Deleted

## 2017-04-01 NOTE — Telephone Encounter (Signed)
Pt is returning call and would like to be called back today if possible

## 2017-04-01 NOTE — Telephone Encounter (Signed)
Narrative    CLINICAL DATA: Intermittent leg spasticity and numbness for the past 2 months.  EXAM: DG HIP (WITH OR WITHOUT PELVIS) 3-4V BILAT  COMPARISON: None.  FINDINGS: There is no evidence of hip fracture or dislocation. There is no evidence of arthropathy or other focal bone abnormality.  IMPRESSION: Normal examination.   Spoke to patient's wife on [place] - she is aware of results.

## 2017-04-01 NOTE — Telephone Encounter (Signed)
Spoke to patient he has been scheduled.

## 2017-04-03 ENCOUNTER — Other Ambulatory Visit: Payer: Self-pay | Admitting: Rheumatology

## 2017-04-03 NOTE — Telephone Encounter (Signed)
ok 

## 2017-04-03 NOTE — Telephone Encounter (Signed)
Last Visit: 01/14/17 Next Visit: 04/15/17 UDS: 09/26/16 Narc Agreement: 09/26/16  Okay to refill Tramadol and Gabapentin?

## 2017-04-03 NOTE — Progress Notes (Signed)
Office Visit Note  Patient: Jeff Russell             Date of Birth: 05-Jun-1965           MRN: [id number]             PCP: Lucretia Kern, DO Referring: Lucretia Kern, DO Visit Date: 04/15/2017 Occupation: @GUAROCC @    Subjective:  Pain in multiple joints   History of Present Illness: Jeff Russell is a 51 y.o. male with history of seropositive rheumatoid arthritis, osteoarthritis, osteopenia.  Patient states he is taking Xeljanz 11 mg and SSZ.  He states after his last visit and ultrasound he restarted SSZ, which did not seem to help much.  He states the Morrie Sheldon used to be beneficial, but he does not feel like it is helping anymore.  He states he has pain in multiple joints, especially in his bilateral hands.  He has joint swelling in his hands, elbows, knees, and ankles.  He has significant joint stiffness and limited ROM of shoulders and elbows.   He states his feet and knees have been causing discomfort.  He continues to have symptoms of plantar fasciitis of his left foot. He continues to have pain in the mid thoracic region from previous compression fractures. He has been taking Tramadol during the day and usually hydrocodone at night.  His refill of hydrocodone ran out.  He states he has not had a flu shot this year.  He has been seeing a neurologist, Dr. Krista Blue, for muscle weakness in upper and lower extremities.      He has previously failed Enbrel, Humira, PLQ, and MTX.    Activities of Daily Living:  Patient reports morning stiffness for 1 hour.   Patient Reports nocturnal pain.  Difficulty dressing/grooming: Denies Difficulty climbing stairs: Reports Difficulty getting out of chair: Reports Difficulty using hands for taps, buttons, cutlery, and/or writing: Reports   Review of Systems  Constitutional: Positive for fatigue. Negative for night sweats and weakness.  HENT: Positive for mouth dryness. Negative for mouth sores and nose dryness.   Eyes: Positive for dryness.  Negative for redness.  Respiratory: Negative for cough, hemoptysis, shortness of breath and difficulty breathing.   Cardiovascular: Negative for chest pain, palpitations, hypertension, irregular heartbeat and swelling in legs/feet.  Gastrointestinal: Negative for blood in stool, constipation and diarrhea.  Endocrine: Negative for increased urination.  Genitourinary: Negative for painful urination.  Musculoskeletal: Positive for arthralgias, joint pain, joint swelling, muscle weakness and morning stiffness. Negative for myalgias, muscle tenderness and myalgias.  Skin: Negative for color change, rash, hair loss, nodules/bumps, skin tightness, ulcers and sensitivity to sunlight.  Allergic/Immunologic: Negative for susceptible to infections.  Neurological: Negative for dizziness, fainting, memory loss and night sweats.  Hematological: Negative for swollen glands.  Psychiatric/Behavioral: Negative for depressed mood and sleep disturbance. The patient is not nervous/anxious.     PMFS History:  Patient Active Problem List   Diagnosis Date Noted  . Muscle weakness (generalized) 03/06/2017  . Weakness 03/04/2017  . Gait abnormality 03/04/2017  . Low back pain without sciatica 03/04/2017  . Spondylolisthesis of lumbar region 07/16/2016  . High risk medication use 07/14/2016  . 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 Primary osteoarthritis of both knees 07/14/2016  . 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 Vertebral compression fracture ([place]) 07/14/2016  . History of MRSA infection 07/14/2016  . Osteopenia of multiple sites 07/14/2016  . Plantar fasciitis of left foot 07/14/2016  . COPD  0 / [place] smoking 02/28/2016  . Morbid obesity due to excess calories ([place]) 02/28/2016  . Multiple pulmonary nodules 11/21/2015  . Multiple environmental allergies 05/16/2014  . Cigarette smoker 05/16/2014  . Rheumatoid arthritis ([place]) 05/01/2011    Past Medical History:  Diagnosis Date  . Allergy     . Arthritis   . COPD (chronic obstructive pulmonary disease) ([place])    per pt  . GERD (gastroesophageal reflux disease)   . History of MRSA infection    right leg per patient  . MSSA (methicillin susceptible Staphylococcus aureus) infection 05/01/2011  . Pneumonia   . Rheumatoid nodules ([place]) 08/06/2014   right lung-per CT scan ordered by Dr Estanislado Pandy  . Septic bursitis 05/01/2011  . Shortness of breath    occ    Family History  Problem Relation Age of Onset  . Arthritis Maternal Grandmother   . Angina Maternal Grandmother   . Colon cancer Paternal Grandfather        deceased  . Prostate cancer Father   . Hypercholesterolemia Mother   . Heart disease Mother        palpitations, ?heart failure  . Hypertension Mother   . Other Mother        Heart enlargement,left ventricle problems  . Congestive Heart Failure Paternal Grandmother   . Esophageal cancer Neg Hx   . Rectal cancer Neg Hx   . Stomach cancer Neg Hx    Past Surgical History:  Procedure Laterality Date  . BACK SURGERY    . carpel tunnel Bilateral   . CHOLECYSTECTOMY    . COLONOSCOPY    . EPIDURAL BLOCK INJECTION    . EYE SURGERY Left    wire in eye  . I&D EXTREMITY  03/24/2011   Procedure: IRRIGATION AND DEBRIDEMENT EXTREMITY;  Surgeon: Yvette Rack., MD;  Location: [place];  Service: Orthopedics;  Laterality: Right;  Irrigation and debridement right knee abcess and Bursa,  application of wound vac  . I&D EXTREMITY  03/28/2011   Procedure: IRRIGATION AND DEBRIDEMENT EXTREMITY;  Surgeon: Yvette Rack., MD;  Location: [facility];  Service: Orthopedics;  Laterality: Right;  I&D right knee with wound closure  . MENISCUS REPAIR Right 2017   Social History   Social History Narrative   Work or School: lawn care      Home Situation: lives with wife whom sees me as well       Spiritual Beliefs: believes in God; does not go to church      Lifestyle: no CV exercise - but physically active at work; diet is poor         Objective: Vital Signs: BP (!) 144/70 (BP Location: Left Arm, Patient Position: Sitting, Cuff Size: Normal)   Pulse 77   Resp 19   Ht 6\' 3"  (1.905 m)   Wt 267 lb (121.1 kg)   BMI 33.37 kg/m    Physical Exam  Constitutional: He is oriented to person, place, and time. He appears well-developed and well-nourished.  HENT:  Head: Normocephalic and atraumatic.  Eyes: Conjunctivae and EOM are normal. Pupils are equal, round, and reactive to light.  Neck: Normal range of motion. Neck supple.  Cardiovascular: Normal rate, regular rhythm and normal heart sounds.  Pulmonary/Chest: Effort normal and breath sounds normal.  Abdominal: Soft. Bowel sounds are normal.  Neurological: He is alert and oriented to person, place, and time.  Skin: Skin is warm and dry. Capillary refill takes less than 2 seconds.  Psychiatric:  He has a normal mood and affect. His behavior is normal.  Nursing note and vitals reviewed.    Musculoskeletal Exam: C-spine limited lateral rotation with discomfort.  Limited shoulder abduction with discomfort.  Midline spinal tenderness in thoracic region.  No SI joint pain.  Right elbow contracture.  Incomplete fist formation.  Synovitis of all MCPs.  Synovitis of bilateral wrists.  Hip joints good ROM.  Knee crepitus, mild warmth, and mild effusion.  Limited flexion of bilateral knees.  Limited ROM of ankles. MTPs, PIPs, and DIPs good ROM.      CDAI Exam: CDAI Homunculus Exam:   Tenderness:  RUE: wrist Right hand: 1st MCP, 2nd MCP, 3rd MCP, 4th MCP and 5th MCP Left hand: 1st MCP, 2nd MCP, 3rd MCP, 4th MCP and 5th MCP RLE: tibiofemoral LLE: tibiofemoral  Swelling:  RUE: wrist LUE: wrist Right hand: 1st MCP, 2nd MCP, 3rd MCP, 4th MCP and 5th MCP Left hand: 1st MCP, 2nd MCP, 4th MCP and 5th MCP RLE: tibiofemoral LLE: tibiofemoral  Joint Counts:  CDAI Tender Joint count: 13 CDAI Swollen Joint count: 13  Global Assessments:  Patient Global Assessment:  8 Provider Global Assessment: 8  CDAI Calculated Score: 42    Investigation: No additional findings.TB Gold: 02/12/2016 UDS: 09/26/2016 Narc agreement: 09/26/2016  CBC Latest Ref Rng & Units 01/02/2017 09/26/2016 07/16/2016  WBC 3.8 - 10.8 Thousand/uL 7.9 7.5 9.3  Hemoglobin 13.2 - 17.1 g/dL 13.8 14.2 12.2(L)  Hematocrit 38.5 - 50.0 % 40.2 44.2 38.1(L)  Platelets 140 - 400 Thousand/uL 267 272 247   CMP Latest Ref Rng & Units 01/02/2017 09/26/2016 07/16/2016  Glucose 65 - 99 mg/dL 107(H) 124(H) -  BUN 7 - 25 mg/dL 14 12 -  Creatinine 0.70 - 1.33 mg/dL 1.01 1.13 1.08  Sodium 135 - 146 mmol/L 140 138 -  Potassium 3.5 - 5.3 mmol/L 4.8 5.1 -  Chloride 98 - 110 mmol/L 106 103 -  CO2 20 - 32 mmol/L 26 21 -  Calcium 8.6 - 10.3 mg/dL 9.8 9.9 -  Total Protein 6.1 - 8.1 g/dL 7.3 7.1 -  Total Bilirubin 0.2 - 1.2 mg/dL 0.4 0.4 -  Alkaline Phos 40 - 115 U/L - 125(H) -  AST 10 - 35 U/L 21 28 -  ALT 9 - 46 U/L 16 30 -    Imaging: Dg Hips Bilat With Pelvis 3-4 Views  Result Date: 03/27/2017 CLINICAL DATA:  Intermittent leg spasticity and numbness for the past 2 months. EXAM: DG HIP (WITH OR WITHOUT PELVIS) 3-4V BILAT COMPARISON:  None. FINDINGS: There is no evidence of hip fracture or dislocation. There is no evidence of arthropathy or other focal bone abnormality. IMPRESSION: Normal examination. Electronically Signed   By: Claudie Revering M.D.   On: 03/27/2017 15:51   Xr Foot 2 Views Left  Result Date: 04/15/2017 First MTP severe narrowing was noted. Second third and fourth and fifth MTP narrowing was noted. Some cystic changes were noted. Mild PIP and DIP narrowing was noted. No significant intertarsal joint space narrowing was noted. No tibiotalar joint space narrowing was noted. A small posterior inferior calcaneal spurs were noted. There was no interval change from the x-rays of 2017. Impression: These findings are consistent with rheumatoid arthritis and osteoarthritis overlap.  Xr Foot 2 Views  Right  Result Date: 04/15/2017 First MTP severe narrowing was noted. Second third and fourth and fifth MTP narrowing was noted. Some cystic changes were noted. Mild PIP and DIP narrowing was noted. No significant  intertarsal joint space narrowing was noted. No tibiotalar joint space narrowing was noted. A small posterior inferior calcaneal spurs were noted. There was no interval change from the x-rays of 2017. Impression: These findings are consistent with rheumatoid arthritis and osteoarthritis overlap.  Xr Hand 2 View Left  Result Date: 04/15/2017 First, second and third MCP narrowing was noted. PIP and DIP narrowing was noted. CMC joint narrowing was noted. Intercarpal joint space narrowing was noted. Cystic versus erosive changes were noted in the carpal bones. Erosive changes were noted in all her styloid. Juxta articular osteopenia was noted. Partial amputation of fourth  digit distal phalanx was noted. No interval change was noted from his x-rays of March 2017. Impression: These findings are consistent with rheumatoid arthritis and osteoarthritis overlap.  Xr Hand 2 View Right  Result Date: 04/15/2017 Second and third MCP joints severe narrowing was noted. All PIP/DIP narrowing was noted. Intercarpal joint space narrowing was noted. Erosive changes were noted in the carpal bones and possible ulnar styloid. No interval change was noted from the x-rays of March 2017. Impression: These findings are consistent with rheumatoid arthritis and osteoarthritis overlap.   Speciality Comments: No specialty comments available.    Procedures:  No procedures performed Allergies: Doxycycline and Humira [adalimumab]   Assessment / Plan:     Visit Diagnoses: Rheumatoid arthritis involving multiple sites with positive rheumatoid factor ([place]): He has synovitis on all MCPs and bilateral wrists on exam.  He has been taking Xeljanz 11 mg and SSZ.  We are going to apply for Actemra.  If his insurance does not  approve Actemra, we will try applying for [place].  He has failed Enbrel, Humira, MTX, PLQ, [place], and now [place].  Ultrasound on 01/14/17 revealed synovitis of MCPs and wrists.  X-rays of hand and feet were obtained today.    Medication counseling:  TB Test: Pending.  Negative 02/12/16 Hepatitis panel: negative 2/1//11 HIV: non reactive 03/26/11 SPEP: Negative 05/18/09 Immunoglobulin: Pending  Lipid panel: LDL WNL 10/28/16  Chest x-ray: multiple pulmonary nodules.  Followed by Dr. Melvyn Novas.  Last Chest CT on 10/11/15  Counseled patient that Actemra is an IL-6 blocking agent.  Counseled patient on purpose, proper use, and adverse effects of Actemra.  Reviewed the most common adverse effects including infections, injection site reaction, bowel injury, and rarely cancer and conditions of the nervous system.  Reviewed that the medication should be held during infections.  Discussed that there is the possibility of an increased risk of malignancy but it is not well understood if this increased risk is due to the medication or the disease state.  Reviewed the importance of regular labs while on Actemra therapy including the need for routine lipid panel.  Advised patient to get standing labs one month after starting Actemra.  Provided patient with standing lab orders.  Counseled patient that Actemra should be held prior to scheduled surgery.  Counseled patient to avoid live vaccines while on Actemra.  Advised patient to get annual influenza vaccine and the pneumococcal vaccine.  Provided patient with medication education material and answered all questions.  Patient voiced understanding.  Patient consented to Actemra.  Will upload consent into the media tab.  Reviewed storage instructions of Actemra with patient.  Advised patient that initial Actemra injection must be given in the office.  Will apply for Actemra through patient's insurance.     High risk medication use - Xeljanz11mg , SSZ.  CBC and CMP were drawn  today.  UDS was updated today  04/15/17.   - Plan: Pain Mgmt, Profile 5 w/Conf, U, CBC with Differential/Platelet, COMPLETE METABOLIC PANEL WITH GFR, QuantiFERON-TB Gold Plus  Pain in both hands - Synovitis of MCPs and bilateral wrists on exam.  X-rays of hands were obtained today. Plan: XR Hand 2 View Left, XR Hand 2 View Right  Pain in both feet -  X-rays of bilateral feet were obtained today. Plan: XR Foot 2 Views Left, XR Foot 2 Views Right   Multiple pulmonary nodules - He is been followed by Dr. Melvyn Novas.  Per patient, he is to return on a PRN basis.    Primary osteoarthritis of both knees - chondromalacia patella: He continues to have significant pain in bilateral knees.  He has limited flexion with crepitus on exam.  He has warmth and a mild effusion of bilateral knees.   Spondylosis of lumbar region without myelopathy or radiculopathy: no midline spinal tenderness.    Osteopenia of multiple sites  Vertebral compression fracture ([facility]) - T10 and T11, the most recent bone density showed only mild osteopenia.  He continues to have midline spinal tenderness in the lower thoracic region.   Pain management - hydrocodone, tramadol UDS: 09/26/2016. Narc agreement: 09/26/2016. UDS was updated today.   Plantar fasciitis of left foot: He continues to have symptoms of plantar fasciitis.    Cigarette smoker  Other medical conditions are listed as follows:  History of MRSA infection  History of COPD     Orders: Orders Placed This Encounter  Procedures  . XR Hand 2 View Left  . XR Hand 2 View Right  . XR Foot 2 Views Left  . XR Foot 2 Views Right  . Pain Mgmt, Profile 5 w/Conf, U  . CBC with Differential/Platelet  . COMPLETE METABOLIC PANEL WITH GFR  . QuantiFERON-TB Gold Plus  . IgG, IgA, IgM   Meds ordered this encounter  Medications  . predniSONE (DELTASONE) 5 MG tablet    Sig: Take 4 tablets for 4 days, then 3 tablets for 4 days, then 2 tablets for 4 days, then 1 tablet for 4 days,  1/2 tablet for 4 days    Dispense:  42 tablet    Refill:  0    Face-to-face time spent with patient was 30 minutes.Greater than 50% of time was spent in counseling and coordination of care.  Follow-Up Instructions: Return in about 5 months (around 09/13/2017) for Rheumatoid arthritis.   [name], MD  Note - This record has been created using Editor, commissioning.  Chart creation errors have been sought, but may not always  have been located. Such creation errors do not reflect on  the standard of medical care.

## 2017-04-06 ENCOUNTER — Other Ambulatory Visit: Payer: Self-pay | Admitting: *Deleted

## 2017-04-06 MED ORDER — SULFASALAZINE 500 MG PO TBEC: 1000.0000 mg | DELAYED_RELEASE_TABLET | Freq: Two times a day (BID) | ORAL | 0 refills | Status: DC

## 2017-04-06 NOTE — Telephone Encounter (Signed)
Refill request received via fax  Last Visit: 01/14/17 Next Visit: 04/15/17 Labs: 01/02/17 Stable  Okay to refill per [name]

## 2017-04-09 ENCOUNTER — Other Ambulatory Visit: Payer: Self-pay | Admitting: Rheumatology

## 2017-04-09 MED ORDER — HYDROCODONE-ACETAMINOPHEN 7.5-325 MG PO TABS: 1.0000 | ORAL_TABLET | Freq: Every day | ORAL | 0 refills | Status: DC

## 2017-04-09 NOTE — Telephone Encounter (Signed)
Patient requesting a refill on Hydrocodone. Please call when ready to pick up.

## 2017-04-09 NOTE — Telephone Encounter (Signed)
Needs UDS . Ok to refill.

## 2017-04-09 NOTE — Telephone Encounter (Signed)
Last Visit: 01/14/17 Next Visit: 04/15/17 UDS: 09/26/16 Narc Agreement: 09/26/16  Okay to refill Hydrocodone?

## 2017-04-14 ENCOUNTER — Ambulatory Visit: Payer: BLUE CROSS/BLUE SHIELD

## 2017-04-14 DIAGNOSIS — G3281 Cerebellar ataxia in diseases classified elsewhere: Secondary | ICD-10-CM | POA: Diagnosis not present

## 2017-04-15 ENCOUNTER — Ambulatory Visit (INDEPENDENT_AMBULATORY_CARE_PROVIDER_SITE_OTHER): Payer: Self-pay

## 2017-04-15 ENCOUNTER — Encounter: Payer: Self-pay | Admitting: Rheumatology

## 2017-04-15 ENCOUNTER — Ambulatory Visit (INDEPENDENT_AMBULATORY_CARE_PROVIDER_SITE_OTHER): Payer: BLUE CROSS/BLUE SHIELD | Admitting: Rheumatology

## 2017-04-15 VITALS — BP 144/70 | HR 77 | Resp 19 | Ht 75.0 in | Wt 267.0 lb

## 2017-04-15 DIAGNOSIS — M79641 Pain in right hand: Secondary | ICD-10-CM

## 2017-04-15 DIAGNOSIS — Z8614 Personal history of Methicillin resistant Staphylococcus aureus infection: Secondary | ICD-10-CM

## 2017-04-15 DIAGNOSIS — M4850XA Collapsed vertebra, not elsewhere classified, site unspecified, initial encounter for fracture: Secondary | ICD-10-CM

## 2017-04-15 DIAGNOSIS — Z8709 Personal history of other diseases of the respiratory system: Secondary | ICD-10-CM | POA: Diagnosis not present

## 2017-04-15 DIAGNOSIS — R52 Pain, unspecified: Secondary | ICD-10-CM | POA: Diagnosis not present

## 2017-04-15 DIAGNOSIS — M79672 Pain in left foot: Secondary | ICD-10-CM

## 2017-04-15 DIAGNOSIS — Z79899 Other long term (current) drug therapy: Secondary | ICD-10-CM

## 2017-04-15 DIAGNOSIS — M79642 Pain in left hand: Secondary | ICD-10-CM

## 2017-04-15 DIAGNOSIS — M8589 Other specified disorders of bone density and structure, multiple sites: Secondary | ICD-10-CM

## 2017-04-15 DIAGNOSIS — M47816 Spondylosis without myelopathy or radiculopathy, lumbar region: Secondary | ICD-10-CM

## 2017-04-15 DIAGNOSIS — R918 Other nonspecific abnormal finding of lung field: Secondary | ICD-10-CM | POA: Diagnosis not present

## 2017-04-15 DIAGNOSIS — F17210 Nicotine dependence, cigarettes, uncomplicated: Secondary | ICD-10-CM | POA: Diagnosis not present

## 2017-04-15 DIAGNOSIS — M722 Plantar fascial fibromatosis: Secondary | ICD-10-CM | POA: Diagnosis not present

## 2017-04-15 DIAGNOSIS — M0579 Rheumatoid arthritis with rheumatoid factor of multiple sites without organ or systems involvement: Secondary | ICD-10-CM | POA: Diagnosis not present

## 2017-04-15 DIAGNOSIS — M170 Bilateral primary osteoarthritis of knee: Secondary | ICD-10-CM | POA: Diagnosis not present

## 2017-04-15 DIAGNOSIS — M79671 Pain in right foot: Secondary | ICD-10-CM | POA: Diagnosis not present

## 2017-04-15 MED ORDER — PREDNISONE 5 MG PO TABS
ORAL_TABLET | ORAL | 0 refills | Status: DC
Start: 2017-04-15 — End: 2017-05-12

## 2017-04-15 NOTE — Patient Instructions (Signed)
Tocilizumab injection What is this medicine? TOCILIZUMAB (TOE si LIZ ue mab) is a monoclonal antibody. It is used to treat rheumatoid arthritis, juvenile idiopathic arthritis, giant cell arteritis, and cytokine release syndrome due to chimeric antigen receptor (CAR) T-cell treatment. This medicine may be used for other purposes; ask your health care provider or pharmacist if you have questions. COMMON BRAND NAME(S): Actemra What should I tell my health care provider before I take this medicine? They need to know if you have any of these conditions: -cancer -diabetes -diverticulitis -heart disease -hepatitis B or history of hepatitis B infection -high blood pressure -high cholesterol -history of pancreatitis -immune system problems -infection (especially a virus infection such as chickenpox, cold sores, or herpes) -liver disease -low blood counts, like low white cell, platelet, or red cell counts -multiple sclerosis -recently received or scheduled to receive a vaccine -scheduled to have surgery -stomach ulcer -stroke -tuberculosis, a positive skin test for tuberculosis or have recently been in close contact with someone who has tuberculosis -an unusual or allergic reaction to tocilizumab, other medicines, foods, dyes, or preservatives -pregnant or trying to get pregnant -breast-feeding How should I use this medicine? This medicine is for infusion into a vein or for injection under the skin. It is usually given by a health care professional in a hospital or clinic setting. If you get this medicine at home, you will be taught how to prepare and give this medicine. Use exactly as directed. Take your medicine at regular intervals. Do not take your medicine more often than directed. It is important that you put your used needles and syringes in a special sharps container. Do not put them in a trash can. If you do not have a sharps container, call your pharmacist or healthcare provider to get  one. A special MedGuide will be given to you by the pharmacist with each prescription and refill. Be sure to read this information carefully each time. Talk to your pediatrician regarding the use of this medicine in children. While the drug may be prescribed for children as young as 2 years for selected conditions, precautions do apply. Overdosage: If you think you have taken too much of this medicine contact a poison control center or emergency room at once. NOTE: This medicine is only for you. Do not share this medicine with others. What if I miss a dose? It is important not to miss your dose. Call your doctor or health care professional if you are unable to keep an appointment. If you give yourself the medicine and you miss a dose, take it as soon as you can. If it is almost time for your next dose, take only that dose. Do not take double or extra doses. What may interact with this medicine? Do not take this medicine with any of the following medications: -live virus vaccines This medicine may also interact with the following medications: -abatacept -adalimumab -anakinra -atorvastatin -certolizumab -cyclosporine -dextromethorphan -etanercept -golimumab -infliximab -lovastatin -medicines that lower the immune system -ofatumumab -omeprazole -oral contraceptives -rituximab -simvastatin -steroid medicines like prednisone or cortisone -theophylline -tositumomab -vaccines -warfarin This list may not describe all possible interactions. Give your health care provider a list of all the medicines, herbs, non-prescription drugs, or dietary supplements you use. Also tell them if you smoke, drink alcohol, or use illegal drugs. Some items may interact with your medicine. What should I watch for while using this medicine? Tell your doctor or healthcare professional if your symptoms do not start to get better or   if they get worse. Your condition will be monitored carefully while you are  receiving this medicine. You will be tested for tuberculosis (TB) before you start this medicine. If your doctor prescribes any medicine for TB, you should start taking the TB medicine before starting this medicine. Make sure to finish the full course of TB medicine. Talk to your doctor about your risk of cancer. You may be more at risk for certain types of cancers if you take this medicine. Call your doctor or health care professional for advice if you get a fever, chills or sore throat, or other symptoms of a cold or flu. Do not treat yourself. This drug decreases your body's ability to fight infections. Try to avoid being around people who are sick. You may need blood work done while you are taking this medicine. What side effects may I notice from receiving this medicine? Side effects that you should report to your doctor or health care professional as soon as possible: -allergic reactions like skin rash, itching or hives, swelling of the face, lips, or tongue -breathing problems -feeling faint or lightheaded, falls -fever or chills, sore throat -stomach pain -unusual bleeding or bruising -yellowing of the eyes or skin Side effects that usually do not require medical attention (report to your doctor or health care professional if they continue or are bothersome): -dizziness -headache -pain, redness, or irritation at site where injected This list may not describe all possible side effects. Call your doctor for medical advice about side effects. You may report side effects to FDA at 1-800-FDA-1088. Where should I keep my medicine? Keep out of the reach of children. If you are using this medicine at home, you will be instructed on how to store this medicine. Throw away any unused medicine after the expiration date on the label. NOTE: This sheet is a summary. It may not cover all possible information. If you have questions about this medicine, talk to your doctor, pharmacist, or health care  provider.  2018 Elsevier/Gold Standard (2015-12-11 12:55:30)  

## 2017-04-17 ENCOUNTER — Telehealth: Payer: Self-pay

## 2017-04-17 LAB — COMPLETE METABOLIC PANEL WITH GFR
AG RATIO: 1.6 (calc) (ref 1.0–2.5)
ALBUMIN MSPROF: 4.4 g/dL (ref 3.6–5.1)
ALT: 19 U/L (ref 9–46)
AST: 27 U/L (ref 10–35)
Alkaline phosphatase (APISO): 109 U/L (ref 40–115)
BILIRUBIN TOTAL: 0.4 mg/dL (ref 0.2–1.2)
BUN: 10 mg/dL (ref 7–25)
CHLORIDE: 104 mmol/L (ref 98–110)
CO2: 28 mmol/L (ref 20–32)
Calcium: 9.9 mg/dL (ref 8.6–10.3)
Creat: 1 mg/dL (ref 0.70–1.33)
GFR, EST AFRICAN AMERICAN: 101 mL/min/{1.73_m2} (ref >=60)
GFR, Est Non African American: 87 mL/min/{1.73_m2} (ref >=60)
GLOBULIN: 2.7 g/dL (ref 1.9–3.7)
Glucose, Bld: 89 mg/dL (ref 65–99)
POTASSIUM: 5 mmol/L (ref 3.5–5.3)
SODIUM: 138 mmol/L (ref 135–146)
TOTAL PROTEIN: 7.1 g/dL (ref 6.1–8.1)

## 2017-04-17 LAB — CBC WITH DIFFERENTIAL/PLATELET
BASOS PCT: 0.8 %
Basophils Absolute: 49 cells/uL (ref 0–200)
EOS ABS: 98 {cells}/uL (ref 15–500)
Eosinophils Relative: 1.6 %
HEMATOCRIT: 43.4 % (ref 38.5–50.0)
HEMOGLOBIN: 14.8 g/dL (ref 13.2–17.1)
Lymphs Abs: 2092 cells/uL (ref 850–3900)
MCH: 31.9 pg (ref 27.0–33.0)
MCHC: 34.1 g/dL (ref 32.0–36.0)
MCV: 93.5 fL (ref 80.0–100.0)
MPV: 9.9 fL (ref 7.5–12.5)
Monocytes Relative: 7.6 %
NEUTROS ABS: 3398 {cells}/uL (ref 1500–7800)
Neutrophils Relative %: 55.7 %
Platelets: 235 10*3/uL (ref 140–400)
RBC: 4.64 10*6/uL (ref 4.20–5.80)
RDW: 12.5 % (ref 11.0–15.0)
Total Lymphocyte: 34.3 %
WBC: 6.1 10*3/uL (ref 3.8–10.8)
WBCMIX: 464 {cells}/uL (ref 200–950)

## 2017-04-17 LAB — PAIN MGMT, PROFILE 5 W/CONF, U
AMPHETAMINES: NEGATIVE ng/mL (ref <500)
BENZODIAZEPINES: NEGATIVE ng/mL (ref <100)
Barbiturates: NEGATIVE ng/mL (ref <300)
CREATININE: 58.6 mg/dL
Cocaine Metabolite: NEGATIVE ng/mL (ref <150)
MARIJUANA METABOLITE: NEGATIVE ng/mL (ref <20)
METHADONE METABOLITE: NEGATIVE ng/mL (ref <100)
OPIATES: NEGATIVE ng/mL (ref <100)
OXYCODONE: NEGATIVE ng/mL (ref <100)
Oxidant: NEGATIVE ug/mL (ref <200)
pH: 6.26 (ref 4.5–9.0)

## 2017-04-17 LAB — QUANTIFERON-TB GOLD PLUS
NIL: 0.04 IU/mL
QuantiFERON-TB Gold Plus: NEGATIVE
TB1-NIL: 0.01 IU/mL
TB2-NIL: 0.01 IU/mL

## 2017-04-17 LAB — IGG, IGA, IGM
IGG (IMMUNOGLOBIN G), SERUM: 1110 mg/dL (ref 694–1618)
IGM, SERUM: 116 mg/dL (ref 48–271)
IMMUNOGLOBULIN A: 247 mg/dL (ref 81–463)

## 2017-04-17 NOTE — Progress Notes (Signed)
All labs are WNL

## 2017-04-17 NOTE — Telephone Encounter (Signed)
I called and spoke with patient's wife and made her aware of these results, ok per DPR.

## 2017-04-17 NOTE — Telephone Encounter (Signed)
-----   Message from Marcial Pacas, MD sent at 04/16/2017  3:37 PM EST ----- Please call pt for normal MRI brain.

## 2017-04-21 ENCOUNTER — Ambulatory Visit
Admission: RE | Admit: 2017-04-21 | Discharge: 2017-04-21 | Disposition: A | Discharge status: Home / Self Care | Payer: BLUE CROSS/BLUE SHIELD | Source: Ambulatory Visit | Attending: Neurology | Admitting: Neurology

## 2017-04-21 DIAGNOSIS — M6281 Muscle weakness (generalized): Secondary | ICD-10-CM

## 2017-04-21 DIAGNOSIS — M545 Low back pain, unspecified: Secondary | ICD-10-CM

## 2017-04-21 DIAGNOSIS — R531 Weakness: Secondary | ICD-10-CM

## 2017-04-21 DIAGNOSIS — G3281 Cerebellar ataxia in diseases classified elsewhere: Secondary | ICD-10-CM

## 2017-04-21 DIAGNOSIS — R269 Unspecified abnormalities of gait and mobility: Secondary | ICD-10-CM

## 2017-04-21 LAB — GRAM STAIN
MICRO NUMBER:: 90059867
SPECIMEN QUALITY: ADEQUATE

## 2017-04-21 LAB — CSF CELL COUNT WITH DIFFERENTIAL
RBC Count, CSF: 1 cells/uL (ref 0–10)
WBC CSF: 0 {cells}/uL (ref 0–5)

## 2017-04-21 LAB — PROTEIN, CSF: TOTAL PROTEIN, CSF: 41 mg/dL (ref 15–45)

## 2017-04-21 LAB — GLUCOSE, CSF: GLUCOSE CSF: 54 mg/dL (ref 40–80)

## 2017-04-21 NOTE — Discharge Instructions (Signed)
° °    Lumbar Puncture Discharge Instructions ° °1. Go home and rest quietly for the next 24 hours.  It is important to lie flat for the next 24 hours.  Get up only to go to the restroom.  You may lie in the bed or on a couch on your back, your stomach, your left side or your right side.  You may have one pillow under your head.  You may have pillows between your knees while you are on your side or under your knees while you are on your back. ° °2. DO NOT drive today.  Recline the seat as far back as it will go, while [place] wearing your seat belt, on the way home. ° °3. You may get up to go to the bathroom as needed.  You may sit up for 10 minutes to eat.  You may resume your normal diet and medications unless otherwise indicated.  Drink lots of extra fluids today and tomorrow. ° °4. The incidence of headache, nausea, or vomiting is about 5% (one in 20 patients).  If you develop a headache, lie flat and drink plenty of fluids until the headache goes away.  Caffeinated beverages may be helpful.  If you develop severe nausea and vomiting or a headache that does not go away with flat bed rest, call the physician who sent you here.  ° °5. You may resume normal activities after your 24 hours of bed rest is over; however, do not exert yourself strongly or do any heavy lifting tomorrow. ° °6. Call your physician for a follow-up appointment.  ° °7. If you have any questions  after you arrive home, please call [phone]. ° °Discharge instructions have been explained to the patient.  The patient, or the person responsible for the patient, fully understands these instructions. °

## 2017-05-12 ENCOUNTER — Encounter: Payer: Self-pay | Admitting: Neurology

## 2017-05-12 ENCOUNTER — Ambulatory Visit: Payer: BLUE CROSS/BLUE SHIELD | Admitting: Neurology

## 2017-05-12 VITALS — BP 118/79 | HR 95 | Ht 75.0 in | Wt 262.5 lb

## 2017-05-12 DIAGNOSIS — M4316 Spondylolisthesis, lumbar region: Secondary | ICD-10-CM | POA: Diagnosis not present

## 2017-05-12 DIAGNOSIS — M0579 Rheumatoid arthritis with rheumatoid factor of multiple sites without organ or systems involvement: Secondary | ICD-10-CM | POA: Diagnosis not present

## 2017-05-12 MED ORDER — PREDNISONE 5 MG PO TABS: ORAL_TABLET | ORAL | 3 refills | Status: DC

## 2017-05-12 NOTE — Progress Notes (Signed)
PATIENT: Jeff Russell DOB: [birth year]  Chief Complaint  Patient presents with  . Follow-up    PCP: Dr. Colin [facility], Rheum: [name] would like to be cc'd on OV notes.      HISTORICAL  Jeff Russell is a 52 year old male, seen in refer by neurosurgeon Dr. 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, taking sulfasalazine 500 mg 2 tablets twice a day, with intermittent flareup of joint pain.  Since summer 2017, he noticed gradual onset bilateral lower extremity weakness, when he stepped out of his lawn mow, he noticed stumbling, unsteady gait, also gradual worsening chronic low back pain, radiating pain to bilateral lower extremity, he has tried different medications including tramadol, hydrocodone, epidural injection which only provide temporary relief of his low back pain,  At the same time, he noticed gradual worsening bilateral lower extremity weakness, fell multiple times, he denies numbness, no neck pain, no bowel bladder incontinence,  He was evaluated by Dr. Christella Noa on May 27, 2016, he was noted to have profound bilateral hip flexion weakness, bilateral thigh abductor, adductor weakness, preserved motor strength at bilateral knee extension, hamstring and distal leg muscle.  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 Christella Noa on July 16, 2016, which has helped his low back pain significantly, post surgically, he seems to have a period of improved bilateral lower extremity weakness, but not long-lasting,  By September 2018, he noticed gradual worsening bilateral lower extremity weakness, slight worsening compared to presurgical level, returning of mild midline low back pain 3 out of 10, left heel pain, as if he has a nail going up his left heel, gait abnormality,  He denies upper extremity weakness or numbness, denies bowel and bladder incontinence.  EMG nerve conduction study on January 07, 2017 by Dr. Marlaine Hind reviewed  Normal sural sensory response, H-reflex, tibial motor conduction study, peroneal motor conduction study, EMG nerve conduction study showed no denervation potential, no morphology changes, but there was decreased recruitment pattern noticed at all the limb muscle tested, there was a concern of upper motor neuron involvement, he is referred here for further evaluation  MRI of left foot in February 2018 showed severe tendinosis of the peroneal brevis just distal to the lateral malleolus with a short segment longitudinal split tear, moderately severe peroneal tenosynovial I does, mild plantar fasciitis of the medial band of the plantar fascia without tear,  Laboratory evaluation in February 2018 normal CBC, CMP, with exception of mild elevated alkaline phosphate 120, triglyceride was mildly elevated at 178, LDL 88,  Update March 27, 2017: We have personally reviewed MRI of cervical spine: Multilevel degenerative changes, mild canal stenosis, no cord signal change, or compression, MRI of thoracic spine showed mild degenerative changes, no evidence of canal stenosis,  Extensive evaluations, showed negative myasthenia gravis panel, A1c 5.0, normal ESR, TSH, B12, CPK level was mildly elevated 261, vitamin D level was  27.2, negative RPR, HIV, mild elevated  C-reactive protein 6,  negative ANA,  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  Continue complains of multiple bilateral hands joint pain, mild low back pain, lower extremity deep achy pain, gait abnormality, but denies upper extremity sensorimotor deficit.  UPDATE May 12 2017: MRI of the brain was normal in January 2019 MRI of the cervical and thoracic spine showed mild degenerative changes there was no significant canal or foraminal narrowing X-ray of hip was normal Laboratory evaluations in January 2019, CSF negative VDRL, fungal stain, Gram stain, protein 41, glucose,  Extensive laboratory evaluation showed normal or negative RPR, HIV,  copper, ANA, CBC, CMP, vitamin B12, TSH, vitamin D was mildly decreased 27, CPK was mildly elevated 261, mild elevated C-reactive protein 6.0, A1c was 5.0, normal myasthenia gravis panel   REVIEW OF SYSTEMS: Full 14 system review of systems performed and notable only for weakness, not enough sleep, joint pain, joint swelling, cramps, achy muscles  ALLERGIES: Allergies  Allergen Reactions  . Doxycycline Other (See Comments)    "burns his face" (Sun Sensitivity?)  . Humira [Adalimumab] Swelling and Rash    HOME MEDICATIONS: Current Outpatient Medications  Medication Sig Dispense Refill  . acetaminophen (TYLENOL ARTHRITIS PAIN) 650 MG CR tablet Take 1,300 mg by mouth 2 (two) times daily as needed for pain.     . Cholecalciferol (VITAMIN D3) 1000 units CAPS Take 1,000 Units by mouth daily.    . cyclobenzaprine (FLEXERIL) 10 MG tablet Take 1 tablet (10 mg total) by mouth 3 (three) times daily as needed for muscle spasms. 90 tablet 0  . esomeprazole (NEXIUM) 20 MG capsule Take 20 mg by mouth daily before breakfast.    . fluticasone (FLONASE) 50 MCG/ACT nasal spray Place 2 sprays into  both nostrils daily as needed for allergies or rhinitis.    Marland Kitchen gabapentin (NEURONTIN) 300 MG capsule TAKE 1 CAPSULE BY MOUTH THREE TIMES DAILY 270 capsule 0  . HYDROcodone-acetaminophen (NORCO) 7.5-325 MG tablet Take 1 tablet by mouth at bedtime. 30 tablet 0  . methocarbamol (ROBAXIN) 500 MG tablet TAKE 1 TABLET BY MOUTH 3 TIMES DAILY EACH DAY, DO NOT TAKE WITH CYCLOBENZAPRINE 90 tablet 0  . sulfaSALAzine (AZULFIDINE) 500 MG EC tablet Take 2 tablets (1,000 mg total) by mouth 2 (two) times daily. 360 tablet 0  . Tofacitinib Citrate (XELJANZ XR) 11 MG TB24 Take 1 tablet by mouth daily. 90 tablet 0  . traMADol (ULTRAM) 50 MG tablet TAKE 1 TO 2 TABLETS BY MOUTH 3 TIMES DAILY AS NEEDED 180 tablet 0   No current facility-administered medications for this visit.     PAST MEDICAL HISTORY: Past Medical History:  Diagnosis Date  . Allergy   . Arthritis   . COPD (chronic obstructive pulmonary disease) ([place])    per pt  . GERD (gastroesophageal reflux disease)   . History of MRSA infection    right leg per patient  . MSSA (methicillin susceptible Staphylococcus aureus) infection 05/01/2011  . Pneumonia   . Rheumatoid nodules ([place]) 08/06/2014   right lung-per CT scan ordered by Dr Estanislado Pandy  . Septic bursitis 05/01/2011  . Shortness of breath    occ    PAST SURGICAL HISTORY: Past Surgical History:  Procedure Laterality Date  . BACK SURGERY    . carpel tunnel Bilateral   . CHOLECYSTECTOMY    . COLONOSCOPY    .  EPIDURAL BLOCK INJECTION    . EYE SURGERY Left    wire in eye  . I&D EXTREMITY  03/24/2011   Procedure: IRRIGATION AND DEBRIDEMENT EXTREMITY;  Surgeon: Yvette Rack., MD;  Location: [facility];  Service: Orthopedics;  Laterality: Right;  Irrigation and debridement right knee abcess and Bursa,  application of wound vac  . I&D EXTREMITY  03/28/2011   Procedure: IRRIGATION AND DEBRIDEMENT EXTREMITY;  Surgeon: Yvette Rack., MD;  Location: [facility];  Service: Orthopedics;  Laterality: Right;   I&D right knee with wound closure  . MENISCUS REPAIR Right 2017    FAMILY HISTORY: Family History  Problem Relation Age of Onset  . Arthritis Maternal Grandmother   . Angina Maternal Grandmother   . Colon cancer Paternal Grandfather        deceased  . Prostate cancer Father   . Hypercholesterolemia Mother   . Heart disease Mother        palpitations, ?heart failure  . Hypertension Mother   . Other Mother        Heart enlargement,left ventricle problems  . Congestive Heart Failure Paternal Grandmother   . Esophageal cancer Neg Hx   . Rectal cancer Neg Hx   . Stomach cancer Neg Hx     SOCIAL HISTORY:  Social History   Socioeconomic History  . Marital status: Married    Spouse name: Not on file  . Number of children: Not on file  . Years of education: Not on file  . Highest education level: Not on file  Social Needs  . Financial resource strain: Not on file  . Food insecurity - worry: Not on file  . Food insecurity - inability: Not on file  . Transportation needs - medical: Not on file  . Transportation needs - non-medical: Not on file  Occupational History  . Not on file  Tobacco Use  . Smoking status: Current Every Day Smoker    Packs/day: 2.00    Years: 30.00    Pack years: 60.00    Types: Cigarettes  . Smokeless tobacco: Never Used  Substance and Sexual Activity  . Alcohol use: No    Comment: quit 20 yrs ago  . Drug use: No  . Sexual activity: Yes  Other Topics Concern  . Not on file  Social History Narrative   Work or School: lawn care      Home Situation: lives with wife whom sees me as well       Spiritual Beliefs: believes in God; does not go to church      Lifestyle: no CV exercise - but physically active at work; diet is poor        PHYSICAL EXAM   Vitals:   05/12/17 1533  BP: 118/79  Pulse: 95  Weight: 262 lb 8 oz (119.1 kg)  Height: '6\' 3"'$  (1.905 m)    Not recorded      Body mass index is 32.81 kg/m.  PHYSICAL  EXAMNIATION:  Gen: NAD, conversant, well nourised, obese, well groomed                     Cardiovascular: Regular rate rhythm, no peripheral edema, warm, nontender. Eyes: Conjunctivae clear without exudates or hemorrhage Neck: Supple, no carotid bruits. Pulmonary: Clear to auscultation bilaterally   NEUROLOGICAL EXAM:  MENTAL STATUS: Speech:    Speech is normal; fluent and spontaneous with normal comprehension.  Cognition:     Orientation to time, place and person  Normal recent and remote memory     Normal Attention span and concentration     Normal Language, naming, repeating,spontaneous speech     Fund of knowledge   CRANIAL NERVES: CN II: Visual fields are full to confrontation. Fundoscopic exam is normal with sharp discs and no vascular changes. Pupils are round equal and briskly reactive to light. CN III, IV, VI: extraocular movement are normal. No ptosis. CN V: Facial sensation is intact to pinprick in all 3 divisions bilaterally. Corneal responses are intact.  CN VII: Face is symmetric with normal eye closure and smile. CN VIII: Hearing is normal to rubbing fingers CN IX, X: Palate elevates symmetrically. Phonation is normal. CN XI: Head turning and shoulder shrug are intact CN XII: Tongue is midline with normal movements and no atrophy.  MOTOR: He has bilateral hand muscle erythematous, swelling, but I did not see any significant bilateral upper extremity proximal distal muscle wasting, weakness  With multiple encouragement, there was no significant bilateral hip flexion weakness noted, there was no significant bilateral distal leg weakness.   REFLEXES: Reflexes are 2+ and symmetric at the biceps, triceps, absent at knees, and ankles. Plantar responses are flexor.  SENSORY: Mildly decreased to light touch, pinprick, and vibratory sensation at the toes.  COORDINATION: Rapid alternating movements and fine finger movements are intact. There is no dysmetria on  finger-to-nose and heel-knee-shin.    GAIT/STANCE: He needs to push up to get up from seated position, wide-based, antalgic, mild cautious gait,  DIAGNOSTIC DATA (LABS, IMAGING, TESTING) - I reviewed patient records, labs, notes, testing and imaging myself where available.   ASSESSMENT AND PLAN  Jeff Russell is a 52 y.o. male    Progressive worsening bilateral lower extremity weakness, gait abnormality  His gait abnormality likely multifactorial, this can be due to his low back pain, multiple joints pain, left foot pain,  Will repeat CPK level,  He was recently treated with a tapering dose of steroid because of the flareup of his rheumatoid arthritis, there was no significant improvement in his gait abnormality,  Refer him to [facility] neurologist for second opinion  Marcial Pacas, M.D. Ph.D.  [facility] [address] [place], [place] [address] Ph: [phone] Fax: [phone]  CC: Ashok Pall, MD

## 2017-05-13 ENCOUNTER — Telehealth: Payer: Self-pay | Admitting: Neurology

## 2017-05-13 DIAGNOSIS — R748 Abnormal levels of other serum enzymes: Secondary | ICD-10-CM

## 2017-05-13 LAB — CK: CK TOTAL: 732 U/L — AB (ref 24–204)

## 2017-05-13 LAB — SEDIMENTATION RATE: SED RATE: 19 mm/h (ref 0–30)

## 2017-05-13 LAB — C-REACTIVE PROTEIN: CRP: 11.1 mg/L — ABNORMAL HIGH (ref 0.0–4.9)

## 2017-05-13 NOTE — Telephone Encounter (Signed)
Please call patient, CPK was 732,  With normal ESR, CRP.   I put in order for repeat lab, he may come in 2 weeks later for repeat lab.  

## 2017-05-13 NOTE — Telephone Encounter (Signed)
Spoke to patient's wife on HIPAA - she is aware of results.  He will come to our office on 06/01/17 and have his labs rechecked.

## 2017-05-14 NOTE — Addendum Note (Signed)
Addended by: Marcial Pacas on: 05/14/2017 07:04 PM   Modules accepted: Level of Service

## 2017-05-21 LAB — FUNGUS CULTURE W SMEAR
MICRO NUMBER:: 90059864
SMEAR:: NONE SEEN
SPECIMEN QUALITY:: ADEQUATE

## 2017-05-25 ENCOUNTER — Other Ambulatory Visit: Payer: Self-pay | Admitting: Rheumatology

## 2017-05-25 MED ORDER — HYDROCODONE-ACETAMINOPHEN 7.5-325 MG PO TABS: 1.0000 | ORAL_TABLET | Freq: Every day | ORAL | 0 refills | Status: DC

## 2017-05-25 NOTE — Telephone Encounter (Signed)
Last Visit: 04/15/17  Next Visit: 07/14/17 UDS: 04/15/17  Narc Agreement: 09/26/16  Okay to refill Hydrocodone?

## 2017-05-25 NOTE — Telephone Encounter (Signed)
Patient request refill on Hydrocodone. Please call when ready to pick up.

## 2017-05-30 ENCOUNTER — Other Ambulatory Visit: Payer: Self-pay | Admitting: Rheumatology

## 2017-06-01 ENCOUNTER — Other Ambulatory Visit (INDEPENDENT_AMBULATORY_CARE_PROVIDER_SITE_OTHER): Payer: Self-pay

## 2017-06-01 DIAGNOSIS — R748 Abnormal levels of other serum enzymes: Secondary | ICD-10-CM | POA: Diagnosis not present

## 2017-06-01 DIAGNOSIS — Z0289 Encounter for other administrative examinations: Secondary | ICD-10-CM

## 2017-06-01 NOTE — Telephone Encounter (Signed)
Last Visit: 04/15/17  Next Visit: 07/14/17 UDS: 04/15/17  Narc Agreement: 09/26/16  Okay to refill Tramadol?

## 2017-06-02 ENCOUNTER — Telehealth: Payer: Self-pay | Admitting: *Deleted

## 2017-06-02 LAB — CK: CK TOTAL: 226 U/L — AB (ref 24–204)

## 2017-06-02 LAB — LACTATE DEHYDROGENASE: LDH: 180 IU/L (ref 121–224)

## 2017-06-02 LAB — ALDOLASE: ALDOLASE: 5.5 U/L (ref 3.3–10.3)

## 2017-06-02 NOTE — Telephone Encounter (Signed)
-----   Message from Marcial Pacas, MD sent at 06/02/2017  3:31 PM EST ----- Please call patient for no significant abnormalities on lab evaluations.

## 2017-06-02 NOTE — Telephone Encounter (Signed)
Spoke to his wife on HIPAA - she is aware of results.

## 2017-06-11 ENCOUNTER — Other Ambulatory Visit: Payer: Self-pay | Admitting: Rheumatology

## 2017-06-11 DIAGNOSIS — R918 Other nonspecific abnormal finding of lung field: Secondary | ICD-10-CM | POA: Diagnosis not present

## 2017-06-11 DIAGNOSIS — M609 Myositis, unspecified: Secondary | ICD-10-CM | POA: Diagnosis not present

## 2017-06-11 DIAGNOSIS — M6281 Muscle weakness (generalized): Secondary | ICD-10-CM | POA: Diagnosis not present

## 2017-06-11 DIAGNOSIS — F17200 Nicotine dependence, unspecified, uncomplicated: Secondary | ICD-10-CM | POA: Diagnosis not present

## 2017-06-11 DIAGNOSIS — M7918 Myalgia, other site: Secondary | ICD-10-CM | POA: Diagnosis not present

## 2017-06-11 DIAGNOSIS — R292 Abnormal reflex: Secondary | ICD-10-CM | POA: Diagnosis not present

## 2017-06-11 DIAGNOSIS — Z888 Allergy status to other drugs, medicaments and biological substances status: Secondary | ICD-10-CM | POA: Diagnosis not present

## 2017-06-11 DIAGNOSIS — Z881 Allergy status to other antibiotic agents status: Secondary | ICD-10-CM | POA: Diagnosis not present

## 2017-06-11 DIAGNOSIS — M069 Rheumatoid arthritis, unspecified: Secondary | ICD-10-CM | POA: Diagnosis not present

## 2017-06-11 NOTE — Telephone Encounter (Signed)
Last Visit: 04/15/17  Next Visit: 07/14/17 Labs: 04/15/17 WNL  Okay to refill per [name]

## 2017-06-24 ENCOUNTER — Telehealth: Payer: Self-pay | Admitting: Rheumatology

## 2017-06-24 NOTE — Telephone Encounter (Signed)
Attempted to contact the patient and unable to leave a message. Mailbox is full.

## 2017-06-24 NOTE — Telephone Encounter (Addendum)
Patient request refill on Hydrocodone. Patient"s wife has an appt tomorrow at 9:45am, if rx can be ready by then she can pick it up. Patient uses [facility]. Please call patient if you send it in. Patient also request rx for Prednisone. Patients right hand is swollen. Lastly, patient states he was expecting to start a new medication (injection) for arthritis, but he has not heard anything further about that. Please call to discuss with patient.

## 2017-06-25 ENCOUNTER — Telehealth: Payer: Self-pay

## 2017-06-25 NOTE — Telephone Encounter (Signed)
Patient has failed to anti-TNF.  We should apply for Actemra.

## 2017-06-25 NOTE — Telephone Encounter (Signed)
Processing a prior authorization for Actemra for pts. His plans preferred medication are Enbrel, Humira and Simponi. He has tried and failed Enbrel and Humira. Is there any reason why patient can not take Simponi?   Thanks!  Kiven Vangilder, [place], CPhT 8:51 AM

## 2017-06-25 NOTE — Telephone Encounter (Signed)
-----   Message from Carole Binning, LPN sent at 2/68/3419  2:41 PM EDT ----- Regarding: Please apply for Actemra apply for Actemra 162 mg weekly.  If his insurance does not approve Actemra, we will try applying for Kevzara.  He has failed Enbrel, Humira, MTX, PLQ, Orencia, and now [place].

## 2017-06-25 NOTE — Telephone Encounter (Signed)
Authorization for Actemra has been submitted to pts insurance via cover my meds. Will update once we have a response.   Jeff Russell  4:10 PM

## 2017-06-26 ENCOUNTER — Other Ambulatory Visit: Payer: Self-pay | Admitting: *Deleted

## 2017-06-26 DIAGNOSIS — M47816 Spondylosis without myelopathy or radiculopathy, lumbar region: Secondary | ICD-10-CM

## 2017-06-26 DIAGNOSIS — M0579 Rheumatoid arthritis with rheumatoid factor of multiple sites without organ or systems involvement: Secondary | ICD-10-CM

## 2017-06-26 DIAGNOSIS — M4850XA Collapsed vertebra, not elsewhere classified, site unspecified, initial encounter for fracture: Secondary | ICD-10-CM

## 2017-06-26 MED ORDER — PREDNISONE 5 MG PO TABS: ORAL_TABLET | ORAL | 0 refills | Status: DC

## 2017-06-30 ENCOUNTER — Telehealth: Payer: Self-pay | Admitting: Neurology

## 2017-06-30 NOTE — Telephone Encounter (Signed)
error 

## 2017-06-30 NOTE — Telephone Encounter (Signed)
Received a CONFIRMATION from [facility] regarding a prior authorization approval for ACTEMRA 162MG /WEEKLY from 06/25/2017 to 04/06/2038.   Reference number: [place] Phone number: [phone]  Will send document to scan center once received.  Called pt to update. We will have his Rx for Actemra sent to [facility]. The first refill will be shipped to the clinic. Once received we will contact him to schedule nursing visit. Patient did not answer. Had to leave a voice message.  Please send Rx for Actemra to [facility]. Thanks!  Matheus Spiker, [place], CPhT 4:15 PM

## 2017-07-01 MED ORDER — TOCILIZUMAB 162 MG/0.9ML ~~LOC~~ SOSY: 162.0000 mg | PREFILLED_SYRINGE | SUBCUTANEOUS | 0 refills | Status: DC

## 2017-07-01 NOTE — Progress Notes (Deleted)
Office Visit Note  Patient: Jeff Russell             Date of Birth: 1965/07/31           MRN: [id number]             PCP: Lucretia Kern, DO Referring: Lucretia Kern, DO Visit Date: 07/14/2017 Occupation: @GUAROCC @    Subjective:  No chief complaint on file.   History of Present Illness: Jeff Russell is a 52 y.o. male ***   Activities of Daily Living:  Patient reports morning stiffness for *** {minute/hour:19697}.   Patient {ACTIONS;DENIES/REPORTS:21021675::"Denies"} nocturnal pain.  Difficulty dressing/grooming: {ACTIONS;DENIES/REPORTS:21021675::"Denies"} Difficulty climbing stairs: {ACTIONS;DENIES/REPORTS:21021675::"Denies"} Difficulty getting out of chair: {ACTIONS;DENIES/REPORTS:21021675::"Denies"} Difficulty using hands for taps, buttons, cutlery, and/or writing: {ACTIONS;DENIES/REPORTS:21021675::"Denies"}   No Rheumatology ROS completed.   PMFS History:  Patient Active Problem List   Diagnosis Date Noted  . Muscle weakness (generalized) 03/06/2017  . Weakness 03/04/2017  . Gait abnormality 03/04/2017  . Low back pain without sciatica 03/04/2017  . Spondylolisthesis of lumbar region 07/16/2016  . High risk medication use 07/14/2016  . 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 Primary osteoarthritis of both knees 07/14/2016  . 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 Vertebral compression fracture ([place]) 07/14/2016  . History of MRSA infection 07/14/2016  . Osteopenia of multiple sites 07/14/2016  . Plantar fasciitis of left foot 07/14/2016  . COPD 0 / [place] smoking 02/28/2016  . Morbid obesity due to excess calories ([place]) 02/28/2016  . Multiple pulmonary nodules 11/21/2015  . Multiple environmental allergies 05/16/2014  . Cigarette smoker 05/16/2014  . Rheumatoid arthritis ([place]) 05/01/2011    Past Medical History:  Diagnosis Date  . Allergy   . Arthritis   . COPD (chronic obstructive pulmonary disease) ([place])    per pt  . GERD (gastroesophageal  reflux disease)   . History of MRSA infection    right leg per patient  . MSSA (methicillin susceptible Staphylococcus aureus) infection 05/01/2011  . Pneumonia   . Rheumatoid nodules ([place]) 08/06/2014   right lung-per CT scan ordered by Dr Estanislado Pandy  . Septic bursitis 05/01/2011  . Shortness of breath    occ    Family History  Problem Relation Age of Onset  . Arthritis Maternal Grandmother   . Angina Maternal Grandmother   . Colon cancer Paternal Grandfather        deceased  . Prostate cancer Father   . Hypercholesterolemia Mother   . Heart disease Mother        palpitations, ?heart failure  . Hypertension Mother   . Other Mother        Heart enlargement,left ventricle problems  . Congestive Heart Failure Paternal Grandmother   . Esophageal cancer Neg Hx   . Rectal cancer Neg Hx   . Stomach cancer Neg Hx    Past Surgical History:  Procedure Laterality Date  . BACK SURGERY    . carpel tunnel Bilateral   . CHOLECYSTECTOMY    . COLONOSCOPY    . EPIDURAL BLOCK INJECTION    . EYE SURGERY Left    wire in eye  . I&D EXTREMITY  03/24/2011   Procedure: IRRIGATION AND DEBRIDEMENT EXTREMITY;  Surgeon: Yvette Rack., MD;  Location: [facility];  Service: Orthopedics;  Laterality: Right;  Irrigation and debridement right knee abcess and Bursa,  application of wound vac  . I&D EXTREMITY  03/28/2011   Procedure: IRRIGATION AND DEBRIDEMENT EXTREMITY;  Surgeon: Yvette Rack., MD;  Location: [facility];  Service: Orthopedics;  Laterality: Right;  I&D right knee with wound closure  . MENISCUS REPAIR Right 2017   Social History   Social History Narrative   Work or School: lawn care      Home Situation: lives with wife whom sees me as well       Spiritual Beliefs: believes in God; does not go to church      Lifestyle: no CV exercise - but physically active at work; diet is poor        Objective: Vital Signs: There were no vitals taken for this visit.   Physical Exam   Musculoskeletal  Exam: ***  CDAI Exam: No CDAI exam completed.    Investigation: No additional findings. CBC Latest Ref Rng & Units 04/15/2017 01/02/2017 09/26/2016  WBC 3.8 - 10.8 Thousand/uL 6.1 7.9 7.5  Hemoglobin 13.2 - 17.1 g/dL 14.8 13.8 14.2  Hematocrit 38.5 - 50.0 % 43.4 40.2 44.2  Platelets 140 - 400 Thousand/uL 235 267 272   CMP Latest Ref Rng & Units 04/15/2017 01/02/2017 09/26/2016  Glucose 65 - 99 mg/dL 89 107(H) 124(H)  BUN 7 - 25 mg/dL 10 14 12   Creatinine 0.70 - 1.33 mg/dL 1.00 1.01 1.13  Sodium 135 - 146 mmol/L 138 140 138  Potassium 3.5 - 5.3 mmol/L 5.0 4.8 5.1  Chloride 98 - 110 mmol/L 104 106 103  CO2 20 - 32 mmol/L 28 26 21   Calcium 8.6 - 10.3 mg/dL 9.9 9.8 9.9  Total Protein 6.1 - 8.1 g/dL 7.1 7.3 7.1  Total Bilirubin 0.2 - 1.2 mg/dL 0.4 0.4 0.4  Alkaline Phos 40 - 115 U/L - - 125(H)  AST 10 - 35 U/L 27 21 28   ALT 9 - 46 U/L 19 16 30     Imaging: No results found.  Speciality Comments: No specialty comments available.    Procedures:  No procedures performed Allergies: Doxycycline and Humira [adalimumab]   Assessment / Plan:     Visit Diagnoses: No diagnosis found.    Orders: No orders of the defined types were placed in this encounter.  No orders of the defined types were placed in this encounter.   Face-to-face time spent with patient was *** minutes. 50% of time was spent in counseling and coordination of care.  Follow-Up Instructions: No follow-ups on file.   Earnestine Mealing, CMA  Note - This record has been created using Editor, commissioning.  Chart creation errors have been sought, but may not always  have been located. Such creation errors do not reflect on  the standard of medical care.

## 2017-07-01 NOTE — Addendum Note (Signed)
Addended by: Carole Binning on: 07/01/2017 09:29 AM   Modules accepted: Orders

## 2017-07-01 NOTE — Telephone Encounter (Signed)
Prescription sent to the pharmacy and will schedule nurse visit once medication has been delivered to the office .

## 2017-07-03 ENCOUNTER — Other Ambulatory Visit: Payer: Self-pay | Admitting: Rheumatology

## 2017-07-03 NOTE — Telephone Encounter (Signed)
Last Visit: 04/15/17  Next Visit: 07/14/17  Okay to refill per [name]

## 2017-07-14 ENCOUNTER — Ambulatory Visit: Payer: BLUE CROSS/BLUE SHIELD | Admitting: Rheumatology

## 2017-07-17 ENCOUNTER — Encounter: Payer: Self-pay | Admitting: Physician Assistant

## 2017-07-17 ENCOUNTER — Telehealth: Payer: Self-pay

## 2017-07-17 ENCOUNTER — Ambulatory Visit (INDEPENDENT_AMBULATORY_CARE_PROVIDER_SITE_OTHER): Payer: BLUE CROSS/BLUE SHIELD | Admitting: Physician Assistant

## 2017-07-17 VITALS — BP 103/71 | HR 87 | Resp 17 | Ht 75.0 in | Wt 253.0 lb

## 2017-07-17 DIAGNOSIS — M4316 Spondylolisthesis, lumbar region: Secondary | ICD-10-CM

## 2017-07-17 DIAGNOSIS — M722 Plantar fascial fibromatosis: Secondary | ICD-10-CM | POA: Diagnosis not present

## 2017-07-17 DIAGNOSIS — F17210 Nicotine dependence, cigarettes, uncomplicated: Secondary | ICD-10-CM | POA: Diagnosis not present

## 2017-07-17 DIAGNOSIS — Z8614 Personal history of Methicillin resistant Staphylococcus aureus infection: Secondary | ICD-10-CM | POA: Diagnosis not present

## 2017-07-17 DIAGNOSIS — M0579 Rheumatoid arthritis with rheumatoid factor of multiple sites without organ or systems involvement: Secondary | ICD-10-CM

## 2017-07-17 DIAGNOSIS — Z79899 Other long term (current) drug therapy: Secondary | ICD-10-CM | POA: Diagnosis not present

## 2017-07-17 DIAGNOSIS — M4850XA Collapsed vertebra, not elsewhere classified, site unspecified, initial encounter for fracture: Secondary | ICD-10-CM | POA: Diagnosis not present

## 2017-07-17 DIAGNOSIS — M170 Bilateral primary osteoarthritis of knee: Secondary | ICD-10-CM | POA: Diagnosis not present

## 2017-07-17 DIAGNOSIS — M8589 Other specified disorders of bone density and structure, multiple sites: Secondary | ICD-10-CM

## 2017-07-17 DIAGNOSIS — R918 Other nonspecific abnormal finding of lung field: Secondary | ICD-10-CM

## 2017-07-17 LAB — COMPLETE METABOLIC PANEL WITH GFR
AG RATIO: 1.8 (calc) (ref 1.0–2.5)
ALBUMIN MSPROF: 4.4 g/dL (ref 3.6–5.1)
ALKALINE PHOSPHATASE (APISO): 108 U/L (ref 40–115)
ALT: 20 U/L (ref 9–46)
AST: 27 U/L (ref 10–35)
BILIRUBIN TOTAL: 0.5 mg/dL (ref 0.2–1.2)
BUN: 14 mg/dL (ref 7–25)
CO2: 28 mmol/L (ref 20–32)
CREATININE: 0.97 mg/dL (ref 0.70–1.33)
Calcium: 9.5 mg/dL (ref 8.6–10.3)
Chloride: 105 mmol/L (ref 98–110)
GFR, Est African American: 104 mL/min/{1.73_m2} (ref >=60)
GFR, Est Non African American: 90 mL/min/{1.73_m2} (ref >=60)
GLOBULIN: 2.5 g/dL (ref 1.9–3.7)
Glucose, Bld: 89 mg/dL (ref 65–99)
POTASSIUM: 4.8 mmol/L (ref 3.5–5.3)
SODIUM: 137 mmol/L (ref 135–146)
Total Protein: 6.9 g/dL (ref 6.1–8.1)

## 2017-07-17 LAB — CBC WITH DIFFERENTIAL/PLATELET
BASOS ABS: 40 {cells}/uL (ref 0–200)
Basophils Relative: 0.6 %
EOS ABS: 161 {cells}/uL (ref 15–500)
Eosinophils Relative: 2.4 %
HEMATOCRIT: 43.2 % (ref 38.5–50.0)
HEMOGLOBIN: 14.9 g/dL (ref 13.2–17.1)
LYMPHS ABS: 1682 {cells}/uL (ref 850–3900)
MCH: 32.8 pg (ref 27.0–33.0)
MCHC: 34.5 g/dL (ref 32.0–36.0)
MCV: 95.2 fL (ref 80.0–100.0)
MPV: 9.8 fL (ref 7.5–12.5)
Monocytes Relative: 9.2 %
NEUTROS ABS: 4201 {cells}/uL (ref 1500–7800)
NEUTROS PCT: 62.7 %
Platelets: 224 10*3/uL (ref 140–400)
RBC: 4.54 10*6/uL (ref 4.20–5.80)
RDW: 12.4 % (ref 11.0–15.0)
Total Lymphocyte: 25.1 %
WBC mixed population: 616 cells/uL (ref 200–950)
WBC: 6.7 10*3/uL (ref 3.8–10.8)

## 2017-07-17 NOTE — Progress Notes (Signed)
Office Visit Note  Patient: Jeff Russell             Date of Birth: 12-25-1965           MRN: [id number]             PCP: Jeff Kern, DO Referring: Jeff Kern, DO Visit Date: 07/17/2017 Occupation: @GUAROCC @    Subjective:  Right hand pain   History of Present Illness: Goerge Mohr Russell is a 53 y.o. male with history of seropositive rheumatoid arthritis and osteoarthritis.  Patient states that he has been off prednisone for about 1 week.  He states that while he was on prednisone his pain diminished significantly.  He states he continues to have pain and swelling in his right hand.  Denies any other joint pain at this time.  He states that his last Xeljanz dose was on Monday.  He states that he is ready to start Actemra.  He states that his plantar fasciitis has resolved.  He states he continues to have some discomfort in his lower back.  He states that he uses a heating pad on a nightly basis.   Activities of Daily Living:  Patient reports morning stiffness for 30 minutes.   Patient Reports nocturnal pain.  Difficulty dressing/grooming: Denies Difficulty climbing stairs: Denies Difficulty getting out of chair: Reports Difficulty using hands for taps, buttons, cutlery, and/or writing: Reports   Review of Systems  Constitutional: Positive for fatigue. Negative for night sweats.  HENT: Positive for mouth dryness. Negative for mouth sores and nose dryness.   Eyes: Negative for redness, visual disturbance and dryness.  Respiratory: Negative for cough, hemoptysis, shortness of breath and difficulty breathing.   Cardiovascular: Negative for chest pain, palpitations, hypertension, irregular heartbeat and swelling in legs/feet.  Gastrointestinal: Negative for blood in stool, constipation and diarrhea.  Endocrine: Negative for increased urination.  Genitourinary: Negative for painful urination.  Musculoskeletal: Positive for arthralgias, joint pain, joint swelling and morning  stiffness. Negative for myalgias, muscle weakness, muscle tenderness and myalgias.  Skin: Positive for rash. Negative for color change, hair loss, nodules/bumps, skin tightness, ulcers and sensitivity to sunlight.  Allergic/Immunologic: Negative for susceptible to infections.  Neurological: Negative for dizziness, fainting, memory loss, night sweats and weakness.  Hematological: Negative for swollen glands.  Psychiatric/Behavioral: Negative for depressed mood and sleep disturbance. The patient is not nervous/anxious.     PMFS History:  Patient Active Problem List   Diagnosis Date Noted  . Muscle weakness (generalized) 03/06/2017  . Weakness 03/04/2017  . Gait abnormality 03/04/2017  . Low back pain without sciatica 03/04/2017  . Spondylolisthesis of lumbar region 07/16/2016  . High risk medication use 07/14/2016  . 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 Primary osteoarthritis of both knees 07/14/2016  . 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 Vertebral compression fracture ([place]) 07/14/2016  . History of MRSA infection 07/14/2016  . Osteopenia of multiple sites 07/14/2016  . Plantar fasciitis of left foot 07/14/2016  . COPD 0 / [place] smoking 02/28/2016  . Morbid obesity due to excess calories ([place]) 02/28/2016  . Multiple pulmonary nodules 11/21/2015  . Multiple environmental allergies 05/16/2014  . Cigarette smoker 05/16/2014  . Rheumatoid arthritis ([place]) 05/01/2011    Past Medical History:  Diagnosis Date  . Allergy   . Arthritis   . COPD (chronic obstructive pulmonary disease) ([place])    per pt  . GERD (gastroesophageal reflux disease)   . History of MRSA infection    right  leg per patient  . MSSA (methicillin susceptible Staphylococcus aureus) infection 05/01/2011  . Pneumonia   . Rheumatoid nodules ([place]) 08/06/2014   right lung-per CT scan ordered by Dr Estanislado Pandy  . Septic bursitis 05/01/2011  . Shortness of breath    occ    Family History  Problem Relation Age of  Onset  . Arthritis Maternal Grandmother   . Angina Maternal Grandmother   . Colon cancer Paternal Grandfather        deceased  . Prostate cancer Father   . Hypercholesterolemia Mother   . Heart disease Mother        palpitations, ?heart failure  . Hypertension Mother   . Other Mother        Heart enlargement,left ventricle problems  . Congestive Heart Failure Paternal Grandmother   . Esophageal cancer Neg Hx   . Rectal cancer Neg Hx   . Stomach cancer Neg Hx    Past Surgical History:  Procedure Laterality Date  . BACK SURGERY    . carpel tunnel Bilateral   . CHOLECYSTECTOMY    . COLONOSCOPY    . EPIDURAL BLOCK INJECTION    . EYE SURGERY Left    wire in eye  . I&D EXTREMITY  03/24/2011   Procedure: IRRIGATION AND DEBRIDEMENT EXTREMITY;  Surgeon: Yvette Rack., MD;  Location: [facility];  Service: Orthopedics;  Laterality: Right;  Irrigation and debridement right knee abcess and Bursa,  application of wound vac  . I&D EXTREMITY  03/28/2011   Procedure: IRRIGATION AND DEBRIDEMENT EXTREMITY;  Surgeon: Yvette Rack., MD;  Location: [facility];  Service: Orthopedics;  Laterality: Right;  I&D right knee with wound closure  . MENISCUS REPAIR Right 2017   Social History   Social History Narrative   Work or School: lawn care      Home Situation: lives with wife whom sees me as well       Spiritual Beliefs: believes in God; does not go to church      Lifestyle: no CV exercise - but physically active at work; diet is poor        Objective: Vital Signs: BP 103/71 (BP Location: Left Arm, Patient Position: Sitting, Cuff Size: Large)   Pulse 87   Resp 17   Ht 6\' 3"  (1.905 m)   Wt 253 lb (114.8 kg)   BMI 31.62 kg/m    Physical Exam  Constitutional: He is oriented to person, place, and time. He appears well-developed and well-nourished.  HENT:  Head: Normocephalic and atraumatic.  Eyes: Pupils are equal, round, and reactive to light. Conjunctivae and EOM are normal.  Neck: Normal  range of motion. Neck supple.  Cardiovascular: Normal rate, regular rhythm and normal heart sounds.  Pulmonary/Chest: Effort normal and breath sounds normal.  Abdominal: Soft. Bowel sounds are normal.  Lymphadenopathy:    He has no cervical adenopathy.  Neurological: He is alert and oriented to person, place, and time.  Skin: Skin is warm and dry. Capillary refill takes less than 2 seconds.  Psychiatric: He has a normal mood and affect. His behavior is normal.  Nursing note and vitals reviewed.    Musculoskeletal Exam: C-spine limited range of motion with lateral rotation.  Thoracic and lumbar spine good range of motion.  Limited shoulder abduction with discomfort.  No midline spinal tenderness.  No SI joint tenderness.  Right elbow flexion contracture.  Left elbow mild contracture.  Incomplete fist formation of right hand.  Synovitis and tenderness of  all right MCP joints.  Extensor tenosynovitis of right wrist.  Synovial thickening of left MCP joints.  Hip joints, knee joints, ankle joints, MTPs, PIPs, DIPs good range of motion with no synovitis.  He has mild warmth and effusion of bilateral knees.  Has bilateral knee crepitus.  He has limited extension of bilateral knees.  Limited range of motion of bilateral ankles.  MTPs PIPs and DIPs good range of motion.  CDAI Exam: CDAI Homunculus Exam:   Tenderness:  RUE: wrist Right hand: 2nd MCP, 3rd MCP, 4th MCP and 5th MCP RLE: tibiofemoral LLE: tibiofemoral  Swelling:  RUE: wrist Right hand: 1st MCP, 2nd MCP, 3rd MCP, 4th MCP and 5th MCP RLE: tibiofemoral LLE: tibiofemoral  Joint Counts:  CDAI Tender Joint count: 7 CDAI Swollen Joint count: 8  Global Assessments:  Patient Global Assessment: 4 Provider Global Assessment: 4  CDAI Calculated Score: 23    Investigation: No additional findings. CBC Latest Ref Rng & Units 04/15/2017 01/02/2017 09/26/2016  WBC 3.8 - 10.8 Thousand/uL 6.1 7.9 7.5  Hemoglobin 13.2 - 17.1 g/dL 14.8 13.8  14.2  Hematocrit 38.5 - 50.0 % 43.4 40.2 44.2  Platelets 140 - 400 Thousand/uL 235 267 272   CMP Latest Ref Rng & Units 04/15/2017 01/02/2017 09/26/2016  Glucose 65 - 99 mg/dL 89 107(H) 124(H)  BUN 7 - 25 mg/dL 10 14 12   Creatinine 0.70 - 1.33 mg/dL 1.00 1.01 1.13  Sodium 135 - 146 mmol/L 138 140 138  Potassium 3.5 - 5.3 mmol/L 5.0 4.8 5.1  Chloride 98 - 110 mmol/L 104 106 103  CO2 20 - 32 mmol/L 28 26 21   Calcium 8.6 - 10.3 mg/dL 9.9 9.8 9.9  Total Protein 6.1 - 8.1 g/dL 7.1 7.3 7.1  Total Bilirubin 0.2 - 1.2 mg/dL 0.4 0.4 0.4  Alkaline Phos 40 - 115 U/L - - 125(H)  AST 10 - 35 U/L 27 21 28   ALT 9 - 46 U/L 19 16 30     Imaging: No results found.  Speciality Comments: No specialty comments available.    Procedures:  No procedures performed Allergies: Doxycycline and Humira [adalimumab]   Assessment / Plan:     Visit Diagnoses: Rheumatoid arthritis involving multiple sites with positive rheumatoid factor ([place]) -He has synovitis of all right MCP joints and extensor tenosynovitis of right wrist.  He continues to take sulfasalazine 2 tablets by mouth twice daily.  He has been off of prednisone for about 1 week.  He finished his last dose of Xeljanz on Monday.  He is ready to start Actemra.  When the Actemra is delivered to our office we will schedule a nurse visit for administration.  He has failed Enbrel, Humira, MTX, PLQ, Orencia, and now [place].  High risk medication use - TB gold: 04/15/17.  CBC and CMP were ordered today to monitor for drug toxicity.- Plan: CBC with Differential/Platelet, COMPLETE METABOLIC PANEL WITH GFR  Multiple pulmonary nodules  Primary osteoarthritis of both knees: He has warmth and effusion of bilateral knees.  Has bilateral knee crepitus.  He has no discomfort at this time.  He has limited extension of bilateral knees.  Plantar fasciitis of left foot: Resolved  Osteopenia of multiple sites: He takes vitamin D 1000 units by mouth  daily.  Spondylolisthesis of lumbar region: Chronic pain   Other medical conditions are listed as follows:  Vertebral compression fracture (H[facility]) - T10 and T11   Cigarette smoker  History of MRSA infection    Orders: Orders Placed  This Encounter  Procedures  . CBC with Differential/Platelet  . COMPLETE METABOLIC PANEL WITH GFR   No orders of the defined types were placed in this encounter.   Face-to-face time spent with patient was 30 minutes. >50% of time was spent in counseling and coordination of care.  Follow-Up Instructions: Return in about 3 months (around 10/16/2017) for Rheumatoid arthritis, Osteoarthritis.   Ofilia Neas, PA-C  Note - This record has been created using Dragon software.  Chart creation errors have been sought, but may not always  have been located. Such creation errors do not reflect on  the standard of medical care.

## 2017-07-17 NOTE — Telephone Encounter (Signed)
Called [facility] to check the status of pts Actemra Rx. Spoke with associate who states that the medication should be delivered on Tuesday, April 16.  Will update once we received.  Jacob Chamblee, [place], CPhT 9:07 AM

## 2017-07-20 NOTE — Progress Notes (Signed)
Labs are WNL.

## 2017-07-21 ENCOUNTER — Telehealth: Payer: Self-pay

## 2017-07-21 NOTE — Telephone Encounter (Signed)
Attempted to contact patient and left message for patient to call the office.  

## 2017-07-21 NOTE — Telephone Encounter (Signed)
Received Actemra from [facility] for pt. Medication has been place in the refrigerator. Please schedule nursing visit. Thanks!  Schawn Byas, [place], CPhT 11:59 AM

## 2017-07-23 ENCOUNTER — Telehealth: Payer: Self-pay | Admitting: Rheumatology

## 2017-07-23 DIAGNOSIS — M6281 Muscle weakness (generalized): Secondary | ICD-10-CM | POA: Diagnosis not present

## 2017-07-23 DIAGNOSIS — M069 Rheumatoid arthritis, unspecified: Secondary | ICD-10-CM | POA: Diagnosis not present

## 2017-07-23 DIAGNOSIS — R29898 Other symptoms and signs involving the musculoskeletal system: Secondary | ICD-10-CM | POA: Diagnosis not present

## 2017-07-23 DIAGNOSIS — M5416 Radiculopathy, lumbar region: Secondary | ICD-10-CM | POA: Diagnosis not present

## 2017-07-23 NOTE — Telephone Encounter (Signed)
Patient has been scheduled for 07/29/17 at 9 am.

## 2017-07-23 NOTE — Telephone Encounter (Signed)
Left message to advise patient we will need to schedule nurse visit for one day next week.

## 2017-07-23 NOTE — Telephone Encounter (Signed)
Patient wants to know if meds have come in yet. Patient is coming into town today, and would like to get injection if possible. Please call to inform.

## 2017-07-29 ENCOUNTER — Ambulatory Visit (INDEPENDENT_AMBULATORY_CARE_PROVIDER_SITE_OTHER): Payer: BLUE CROSS/BLUE SHIELD | Admitting: *Deleted

## 2017-07-29 VITALS — BP 114/78 | HR 86

## 2017-07-29 DIAGNOSIS — M0579 Rheumatoid arthritis with rheumatoid factor of multiple sites without organ or systems involvement: Secondary | ICD-10-CM | POA: Diagnosis not present

## 2017-07-29 MED ORDER — TOCILIZUMAB 162 MG/0.9ML ~~LOC~~ SOSY
162.0000 mg | PREFILLED_SYRINGE | Freq: Once | SUBCUTANEOUS | Status: AC
  Administered 2017-07-29: 162 mg via SUBCUTANEOUS

## 2017-07-29 NOTE — Progress Notes (Signed)
Patient in office for new start to Actemra. Patient was given a demonstration on proper technique to self administer medication. Patient was able to demonstrate proper technique. Patient was given injection in his right lower abdomen and tolerated injection well. Patient was monitored in office for 30 minutes after injection for adverse reactions. No adverse reactions noted.   Administrations This Visit    Tocilizumab SOSY 162 mg    Admin Date 07/29/2017 Action Given Dose 162 mg Route Subcutaneous Administered By Carole Binning, LPN

## 2017-07-29 NOTE — Patient Instructions (Signed)
Standing Labs We placed an order today for your standing lab work.    Please come back and get your standing labs in 1 month and every 3 months  We have open lab Monday through Friday from 8:30-11:30 AM and 1:30-4:00 PM  at the office of Dr. Shaili Deveshwar.   You may experience shorter wait times on Monday and Friday afternoons. The office is located at [address], [address], [place], [place] [address] No appointment is necessary.   Labs are drawn by [facility].  You may receive a bill from [facility] for your lab work. If you have any questions regarding directions or hours of operation,  please call [phone].     

## 2017-08-04 ENCOUNTER — Other Ambulatory Visit: Payer: Self-pay | Admitting: Physician Assistant

## 2017-08-18 ENCOUNTER — Telehealth: Payer: Self-pay | Admitting: Rheumatology

## 2017-08-18 NOTE — Telephone Encounter (Signed)
Patient advised that we will be unable to refill the Tramadol. Patient states he is unable to afford to see both [place] and pain management. Patient states he is going to need to find someone who can mange his pain and his RA.

## 2017-08-18 NOTE — Telephone Encounter (Signed)
Patient called requesting prescription refill of Tramadol.  Patient's pharmacy is [facility].

## 2017-09-28 ENCOUNTER — Other Ambulatory Visit: Payer: Self-pay | Admitting: Rheumatology

## 2017-09-28 NOTE — Telephone Encounter (Signed)
Last Visit: 07/17/17 Next Visit: 10/16/17  Okay to refill per [name]

## 2017-10-02 NOTE — Progress Notes (Signed)
Office Visit Note  Patient: Jeff Russell             Date of Birth: 03-08-1966           MRN: [id number]             PCP: Lucretia Kern, DO Referring: Lucretia Kern, DO Visit Date: 10/16/2017 Occupation: @GUAROCC @    Subjective:  Muscle aches   History of Present Illness: Jeff Russell Care is a 52 y.o. male with history of seropositive rheumatoid arthritis and osteoarthritis.  He takes sulfasalazine 500 mg 2 tablets by mouth twice daily.  He injects Actemra 120 mg into the skin once weekly.  Patient reports he started on Actemra over 1 month ago.  He denies any doses.  He reports he has noticed improvement since starting on Actemra. He denies any joint pain or joint swelling at this time.  He continues to have stiffness last about 30 minutes to an hour every morning.  He states that his biggest complaint right now is muscle muscle aches and tenderness in his lower extremities.  He states he has a physically demanding job which causes more muscle aches.  He states he takes Robaxin as needed for muscle spasms during the day and Flexeril at bedtime.    Activities of Daily Living:  Patient reports morning stiffness for 30-60 minutes.   Patient Reports nocturnal pain.  Difficulty dressing/grooming: Denies Difficulty climbing stairs: Denies Difficulty getting out of chair: Denies Difficulty using hands for taps, buttons, cutlery, and/or writing: Denies   Review of Systems  Constitutional: Positive for fatigue. Negative for night sweats.  HENT: Positive for mouth dryness. Negative for mouth sores, trouble swallowing, trouble swallowing and nose dryness.   Eyes: Positive for dryness. Negative for redness and visual disturbance.  Respiratory: Negative for cough, hemoptysis, shortness of breath and difficulty breathing.   Cardiovascular: Negative for chest pain, palpitations, hypertension, irregular heartbeat and swelling in legs/feet.  Gastrointestinal: Negative for blood in stool,  constipation and diarrhea.  Endocrine: Negative for increased urination.  Genitourinary: Negative for painful urination.  Musculoskeletal: Positive for arthralgias, joint pain, myalgias, morning stiffness and myalgias. Negative for joint swelling, muscle weakness and muscle tenderness.  Skin: Negative for color change, rash, hair loss, nodules/bumps, skin tightness, ulcers and sensitivity to sunlight.  Allergic/Immunologic: Negative for susceptible to infections.  Neurological: Negative for dizziness, fainting, memory loss, night sweats and weakness.  Hematological: Negative for swollen glands.  Psychiatric/Behavioral: Negative for depressed mood and sleep disturbance. The patient is not nervous/anxious.     PMFS History:  Patient Active Problem List   Diagnosis Date Noted  . Muscle weakness (generalized) 03/06/2017  . Weakness 03/04/2017  . Gait abnormality 03/04/2017  . Low back pain without sciatica 03/04/2017  . Spondylolisthesis of lumbar region 07/16/2016  . High risk medication use 07/14/2016  . 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 Primary osteoarthritis of both knees 07/14/2016  . 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 Vertebral compression fracture ([place]) 07/14/2016  . History of MRSA infection 07/14/2016  . Osteopenia of multiple sites 07/14/2016  . Plantar fasciitis of left foot 07/14/2016  . COPD 0 / [place] smoking 02/28/2016  . Morbid obesity due to excess calories ([place]) 02/28/2016  . Multiple pulmonary nodules 11/21/2015  . Multiple environmental allergies 05/16/2014  . Cigarette smoker 05/16/2014  . Rheumatoid arthritis ([place]) 05/01/2011    Past Medical History:  Diagnosis Date  . Allergy   . Arthritis   . COPD (chronic  obstructive pulmonary disease) ([place])    per pt  . GERD (gastroesophageal reflux disease)   . History of MRSA infection    right leg per patient  . MSSA (methicillin susceptible Staphylococcus aureus) infection 05/01/2011  . Pneumonia     . Rheumatoid nodules ([place]) 08/06/2014   right lung-per CT scan ordered by Dr Estanislado Pandy  . Septic bursitis 05/01/2011  . Shortness of breath    occ    Family History  Problem Relation Age of Onset  . Arthritis Maternal Grandmother   . Angina Maternal Grandmother   . Colon cancer Paternal Grandfather        deceased  . Prostate cancer Father   . Hypercholesterolemia Mother   . Heart disease Mother        palpitations, ?heart failure  . Hypertension Mother   . Other Mother        Heart enlargement,left ventricle problems  . Congestive Heart Failure Paternal Grandmother   . Esophageal cancer Neg Hx   . Rectal cancer Neg Hx   . Stomach cancer Neg Hx    Past Surgical History:  Procedure Laterality Date  . BACK SURGERY    . carpel tunnel Bilateral   . CHOLECYSTECTOMY    . COLONOSCOPY    . EPIDURAL BLOCK INJECTION    . EYE SURGERY Left    wire in eye  . I&D EXTREMITY  03/24/2011   Procedure: IRRIGATION AND DEBRIDEMENT EXTREMITY;  Surgeon: Yvette Rack., MD;  Location: [facility];  Service: Orthopedics;  Laterality: Right;  Irrigation and debridement right knee abcess and Bursa,  application of wound vac  . I&D EXTREMITY  03/28/2011   Procedure: IRRIGATION AND DEBRIDEMENT EXTREMITY;  Surgeon: Yvette Rack., MD;  Location: [facility];  Service: Orthopedics;  Laterality: Right;  I&D right knee with wound closure  . MENISCUS REPAIR Right 2017   Social History   Social History Narrative   Work or School: lawn care      Home Situation: lives with wife whom sees me as well       Spiritual Beliefs: believes in God; does not go to church      Lifestyle: no CV exercise - but physically active at work; diet is poor        Objective: Vital Signs: BP 115/74 (BP Location: Left Arm, Patient Position: Sitting, Cuff Size: Normal)   Pulse 80   Resp 16   Ht 6\' 3"  (1.905 m)   Wt 250 lb (113.4 kg)   BMI 31.25 kg/m    Physical Exam  Constitutional: He is oriented to person, place, and time.  He appears well-developed and well-nourished.  HENT:  Head: Normocephalic and atraumatic.  Eyes: Pupils are equal, round, and reactive to light. Conjunctivae and EOM are normal.  Neck: Normal range of motion. Neck supple.  Cardiovascular: Normal rate, regular rhythm and normal heart sounds.  Pulmonary/Chest: Effort normal and breath sounds normal.  Abdominal: Soft. Bowel sounds are normal.  Lymphadenopathy:    He has no cervical adenopathy.  Neurological: He is alert and oriented to person, place, and time.  Skin: Skin is warm and dry. Capillary refill takes less than 2 seconds.  Psychiatric: He has a normal mood and affect. His behavior is normal.  Nursing note and vitals reviewed.    Musculoskeletal Exam: C-spine, thoracic spine, and lumbar spine good ROM.  No midline spinal tenderness.  No SI joint tenderness. 120 abduction of bilateral shoulder joints.  Elbow joints, wrist  joints, MCPs, PIPs, and DIPs good ROM with no synovitis.  Complete fist formation. Right 2nd and 3rd MCP joint synovial thickening.  Left 3rd MCP synovial thickening. Hip joints, knee joints, ankle joints, MTPs,PIPs, and DIPs good ROM with no synovitis.  No warmth or effusion of knee joints. No plantar fasciitis or achilles tendonitis.   CDAI Exam: CDAI Homunculus Exam:   Joint Counts:  CDAI Tender Joint count: 0 CDAI Swollen Joint count: 0  Global Assessments:  Patient Global Assessment: 2 Provider Global Assessment: 2  CDAI Calculated Score: 4    Investigation: No additional findings. CBC Latest Ref Rng & Units 07/17/2017 04/15/2017 01/02/2017  WBC 3.8 - 10.8 Thousand/uL 6.7 6.1 7.9  Hemoglobin 13.2 - 17.1 g/dL 14.9 14.8 13.8  Hematocrit 38.5 - 50.0 % 43.2 43.4 40.2  Platelets 140 - 400 Thousand/uL 224 235 267   CMP Latest Ref Rng & Units 07/17/2017 04/15/2017 01/02/2017  Glucose 65 - 99 mg/dL 89 89 107(H)  BUN 7 - 25 mg/dL 14 10 14   Creatinine 0.70 - 1.33 mg/dL 0.97 1.00 1.01  Sodium 135 - 146 mmol/L 137  138 140  Potassium 3.5 - 5.3 mmol/L 4.8 5.0 4.8  Chloride 98 - 110 mmol/L 105 104 106  CO2 20 - 32 mmol/L 28 28 26   Calcium 8.6 - 10.3 mg/dL 9.5 9.9 9.8  Total Protein 6.1 - 8.1 g/dL 6.9 7.1 7.3  Total Bilirubin 0.2 - 1.2 mg/dL 0.5 0.4 0.4  Alkaline Phos 40 - 115 U/L - - -  AST 10 - 35 U/L 27 27 21   ALT 9 - 46 U/L 20 19 16      Imaging: No results found.  Speciality Comments: No specialty comments available.    Procedures:  No procedures performed Allergies: Doxycycline and Humira [adalimumab]   Assessment / Plan:     Visit Diagnoses: Rheumatoid arthritis involving multiple sites with positive rheumatoid factor ([place]): He has no active synovitis on exam today.  He has synovial thickening of his right second and third MCP joints and left third MCP joint.  He has complete fist formation on exam today.  He denies any joint pain or joint swelling today.  He has not had any recent rheumatoid arthritis flares.  He started on Actemra about 1 month ago and has been taking sulfasalazine 500 mg 2 tablets twice daily.  He has not missed any doses of his medications.  He will continue on this current treatment regimen.  He is advised to notify us if he develops increased joint pain or joint swelling.  High risk medication use - Actemra subq once weekly and SSZ 500 mg 2 tablets BID.  CBC and CMP were ordered today to monitor for drug toxicity.  He will return in October and every 3 months for lab work.  Standing orders are in place.- Plan: CBC with Differential/Platelet, COMPLETE METABOLIC PANEL WITH GFR  Primary osteoarthritis of both knees: No warmth or effusion.  Has good range of motion on exam.  He has occasional discomfort in bilateral knee joints.  Plantar fasciitis of left foot - Resolved.   Other medical conditions are listed as follows:  Vertebral compression fracture ([place])  Smoker  Spondylolisthesis of lumbar region: No midline spinal tenderness.  Osteopenia of multiple sites: He  takes Vitamin D 1000 units daily.  Multiple pulmonary nodules  History of MRSA infection  History of COPD  Association of heart disease with rheumatoid arthritis was discussed. Need to monitor blood pressure, cholesterol, and to exercise  30-60 minutes on daily basis was discussed. Poor dental hygiene can be a predisposing factor for rheumatoid arthritis. Good dental hygiene was discussed.  Orders: Orders Placed This Encounter  Procedures  . CBC with Differential/Platelet  . COMPLETE METABOLIC PANEL WITH GFR   No orders of the defined types were placed in this encounter.     Follow-Up Instructions: Return in about 5 months (around 03/18/2018) for Rheumatoid arthritis, Osteoarthritis.   Ofilia Neas, PA-C   I examined and evaluated the patient with [name] PA.  Patient appears to be doing much better on Actemra and sulfasalazine combination.  He had no synovitis on my examination today.  He does have some synovial thickening which causes limited range of motion and some joints.  The plan of care was discussed as noted above.  [name], MD  Note - This record has been created using Editor, commissioning.  Chart creation errors have been sought, but may not always  have been located. Such creation errors do not reflect on  the standard of medical care.

## 2017-10-14 ENCOUNTER — Other Ambulatory Visit: Payer: Self-pay | Admitting: Rheumatology

## 2017-10-14 ENCOUNTER — Other Ambulatory Visit: Payer: Self-pay | Admitting: *Deleted

## 2017-10-14 MED ORDER — TOCILIZUMAB 162 MG/0.9ML ~~LOC~~ SOSY: 162.0000 mg | PREFILLED_SYRINGE | SUBCUTANEOUS | 0 refills | Status: DC

## 2017-10-14 NOTE — Telephone Encounter (Signed)
Last Visit: 07/17/17 Next Visit: 10/16/17 Labs: 07/17/17 WNL  Okay to refill per [name]

## 2017-10-14 NOTE — Telephone Encounter (Signed)
Refill request received via fax  Last Visit: 07/17/17 Next visit: 10/16/17 Labs: 07/17/17 WNL TB Gold: 04/15/17 Neg   Okay to refill per [name]

## 2017-10-16 ENCOUNTER — Ambulatory Visit: Payer: BLUE CROSS/BLUE SHIELD | Admitting: Rheumatology

## 2017-10-16 ENCOUNTER — Encounter: Payer: Self-pay | Admitting: Physician Assistant

## 2017-10-16 VITALS — BP 115/74 | HR 80 | Resp 16 | Ht 75.0 in | Wt 250.0 lb

## 2017-10-16 DIAGNOSIS — M170 Bilateral primary osteoarthritis of knee: Secondary | ICD-10-CM | POA: Diagnosis not present

## 2017-10-16 DIAGNOSIS — M0579 Rheumatoid arthritis with rheumatoid factor of multiple sites without organ or systems involvement: Secondary | ICD-10-CM

## 2017-10-16 DIAGNOSIS — M4850XA Collapsed vertebra, not elsewhere classified, site unspecified, initial encounter for fracture: Secondary | ICD-10-CM | POA: Diagnosis not present

## 2017-10-16 DIAGNOSIS — Z8709 Personal history of other diseases of the respiratory system: Secondary | ICD-10-CM | POA: Diagnosis not present

## 2017-10-16 DIAGNOSIS — Z79899 Other long term (current) drug therapy: Secondary | ICD-10-CM | POA: Diagnosis not present

## 2017-10-16 DIAGNOSIS — R918 Other nonspecific abnormal finding of lung field: Secondary | ICD-10-CM

## 2017-10-16 DIAGNOSIS — M722 Plantar fascial fibromatosis: Secondary | ICD-10-CM

## 2017-10-16 DIAGNOSIS — Z8614 Personal history of Methicillin resistant Staphylococcus aureus infection: Secondary | ICD-10-CM | POA: Diagnosis not present

## 2017-10-16 DIAGNOSIS — M4316 Spondylolisthesis, lumbar region: Secondary | ICD-10-CM | POA: Diagnosis not present

## 2017-10-16 DIAGNOSIS — F17200 Nicotine dependence, unspecified, uncomplicated: Secondary | ICD-10-CM | POA: Diagnosis not present

## 2017-10-16 DIAGNOSIS — M8589 Other specified disorders of bone density and structure, multiple sites: Secondary | ICD-10-CM | POA: Diagnosis not present

## 2017-10-16 LAB — COMPLETE METABOLIC PANEL WITH GFR
AG Ratio: 2.1 (calc) (ref 1.0–2.5)
ALBUMIN MSPROF: 4.4 g/dL (ref 3.6–5.1)
ALKALINE PHOSPHATASE (APISO): 86 U/L (ref 40–115)
ALT: 26 U/L (ref 9–46)
AST: 28 U/L (ref 10–35)
BILIRUBIN TOTAL: 0.6 mg/dL (ref 0.2–1.2)
BUN: 20 mg/dL (ref 7–25)
CHLORIDE: 101 mmol/L (ref 98–110)
CO2: 29 mmol/L (ref 20–32)
CREATININE: 1 mg/dL (ref 0.70–1.33)
Calcium: 9.6 mg/dL (ref 8.6–10.3)
GFR, EST AFRICAN AMERICAN: 101 mL/min/{1.73_m2} (ref >=60)
GFR, Est Non African American: 87 mL/min/{1.73_m2} (ref >=60)
Globulin: 2.1 g/dL (calc) (ref 1.9–3.7)
Glucose, Bld: 77 mg/dL (ref 65–99)
Potassium: 4.6 mmol/L (ref 3.5–5.3)
Sodium: 137 mmol/L (ref 135–146)
TOTAL PROTEIN: 6.5 g/dL (ref 6.1–8.1)

## 2017-10-16 LAB — CBC WITH DIFFERENTIAL/PLATELET
BASOS PCT: 1 %
Basophils Absolute: 69 cells/uL (ref 0–200)
EOS PCT: 4.5 %
Eosinophils Absolute: 311 cells/uL (ref 15–500)
HCT: 44.3 % (ref 38.5–50.0)
HEMOGLOBIN: 15.1 g/dL (ref 13.2–17.1)
Lymphs Abs: 2174 cells/uL (ref 850–3900)
MCH: 34.1 pg — AB (ref 27.0–33.0)
MCHC: 34.1 g/dL (ref 32.0–36.0)
MCV: 100 fL (ref 80.0–100.0)
MPV: 10.1 fL (ref 7.5–12.5)
Monocytes Relative: 8.5 %
NEUTROS ABS: 3761 {cells}/uL (ref 1500–7800)
Neutrophils Relative %: 54.5 %
PLATELETS: 172 10*3/uL (ref 140–400)
RBC: 4.43 10*6/uL (ref 4.20–5.80)
RDW: 12.9 % (ref 11.0–15.0)
TOTAL LYMPHOCYTE: 31.5 %
WBC mixed population: 587 cells/uL (ref 200–950)
WBC: 6.9 10*3/uL (ref 3.8–10.8)

## 2017-10-16 NOTE — Patient Instructions (Signed)
Standing Labs We placed an order today for your standing lab work.   Please come back and get your standing labs in October and every 3 months   We have open lab Monday through Friday from 8:30-11:30 AM and 1:30-4:00 PM  at the office of Dr. Shaili Deveshwar.   You may experience shorter wait times on Monday and Friday afternoons. The office is located at [address], [address], [place], [place] [address] No appointment is necessary.   Labs are drawn by [facility].  You may receive a bill from [facility] for your lab work. If you have any questions regarding directions or hours of operation,  please call [phone].    

## 2017-10-19 NOTE — Progress Notes (Signed)
Labs are WNL.

## 2017-11-05 ENCOUNTER — Ambulatory Visit: Payer: BLUE CROSS/BLUE SHIELD | Admitting: Neurology

## 2017-11-09 ENCOUNTER — Ambulatory Visit: Payer: BLUE CROSS/BLUE SHIELD | Admitting: Neurology

## 2017-11-11 ENCOUNTER — Other Ambulatory Visit: Payer: Self-pay | Admitting: Rheumatology

## 2017-11-11 NOTE — Telephone Encounter (Signed)
Last Visit: 10/16/17 Next visit: 03/19/18 Labs: 10/16/17 WNL TB Gold: 04/15/17 Neg   Okay to refill per [name]

## 2017-12-18 ENCOUNTER — Other Ambulatory Visit: Payer: Self-pay | Admitting: Rheumatology

## 2017-12-18 NOTE — Telephone Encounter (Signed)
Last Visit: 10/16/17 Next Visit: 03/19/18  Okay to refill per [name]

## 2018-01-01 DIAGNOSIS — B351 Tinea unguium: Secondary | ICD-10-CM | POA: Diagnosis not present

## 2018-01-01 DIAGNOSIS — B353 Tinea pedis: Secondary | ICD-10-CM | POA: Diagnosis not present

## 2018-01-01 DIAGNOSIS — B356 Tinea cruris: Secondary | ICD-10-CM | POA: Diagnosis not present

## 2018-01-14 ENCOUNTER — Other Ambulatory Visit: Payer: Self-pay | Admitting: Rheumatology

## 2018-01-14 NOTE — Telephone Encounter (Signed)
Last Visit: 10/16/17 Next visit: 03/19/18 Labs: 10/16/17 WNL  Okay to refill per Dr.Deveshwar

## 2018-01-20 ENCOUNTER — Telehealth: Payer: Self-pay | Admitting: Rheumatology

## 2018-01-20 DIAGNOSIS — Z79899 Other long term (current) drug therapy: Secondary | ICD-10-CM

## 2018-01-20 NOTE — Telephone Encounter (Signed)
Patient request a refill of Actemra, sent to [facility].

## 2018-01-21 MED ORDER — TOCILIZUMAB 162 MG/0.9ML ~~LOC~~ SOSY: PREFILLED_SYRINGE | SUBCUTANEOUS | 0 refills | Status: DC

## 2018-01-21 NOTE — Telephone Encounter (Signed)
Last Visit: 10/16/17 Next visit: 03/19/18 Labs: 10/16/17 WNL TB Gold: 04/15/17 Neg   Patient advised he is due for labs. Patient will update labs 01/22/18  Okay to refill per [name]

## 2018-01-22 ENCOUNTER — Telehealth: Payer: Self-pay | Admitting: Physician Assistant

## 2018-01-22 ENCOUNTER — Other Ambulatory Visit: Payer: Self-pay

## 2018-01-22 DIAGNOSIS — R002 Palpitations: Secondary | ICD-10-CM

## 2018-01-22 DIAGNOSIS — Z79899 Other long term (current) drug therapy: Secondary | ICD-10-CM | POA: Diagnosis not present

## 2018-01-22 DIAGNOSIS — M0579 Rheumatoid arthritis with rheumatoid factor of multiple sites without organ or systems involvement: Secondary | ICD-10-CM

## 2018-01-22 DIAGNOSIS — R000 Tachycardia, unspecified: Secondary | ICD-10-CM

## 2018-01-22 LAB — COMPLETE METABOLIC PANEL WITH GFR
AG Ratio: 2 (calc) (ref 1.0–2.5)
ALKALINE PHOSPHATASE (APISO): 76 U/L (ref 40–115)
ALT: 34 U/L (ref 9–46)
AST: 39 U/L — ABNORMAL HIGH (ref 10–35)
Albumin: 4.4 g/dL (ref 3.6–5.1)
BILIRUBIN TOTAL: 0.5 mg/dL (ref 0.2–1.2)
BUN: 12 mg/dL (ref 7–25)
CHLORIDE: 103 mmol/L (ref 98–110)
CO2: 27 mmol/L (ref 20–32)
Calcium: 9.7 mg/dL (ref 8.6–10.3)
Creat: 1.06 mg/dL (ref 0.70–1.33)
GFR, EST AFRICAN AMERICAN: 93 mL/min/{1.73_m2} (ref >=60)
GFR, Est Non African American: 80 mL/min/{1.73_m2} (ref >=60)
GLOBULIN: 2.2 g/dL (ref 1.9–3.7)
Glucose, Bld: 88 mg/dL (ref 65–99)
POTASSIUM: 4.6 mmol/L (ref 3.5–5.3)
SODIUM: 138 mmol/L (ref 135–146)
TOTAL PROTEIN: 6.6 g/dL (ref 6.1–8.1)

## 2018-01-22 LAB — CBC WITH DIFFERENTIAL/PLATELET
BASOS ABS: 69 {cells}/uL (ref 0–200)
BASOS PCT: 1.1 %
EOS ABS: 302 {cells}/uL (ref 15–500)
EOS PCT: 4.8 %
HCT: 44.6 % (ref 38.5–50.0)
Hemoglobin: 15.7 g/dL (ref 13.2–17.1)
Lymphs Abs: 2180 cells/uL (ref 850–3900)
MCH: 35.5 pg — AB (ref 27.0–33.0)
MCHC: 35.2 g/dL (ref 32.0–36.0)
MCV: 100.9 fL — ABNORMAL HIGH (ref 80.0–100.0)
MONOS PCT: 8.6 %
MPV: 10 fL (ref 7.5–12.5)
NEUTROS ABS: 3207 {cells}/uL (ref 1500–7800)
Neutrophils Relative %: 50.9 %
PLATELETS: 192 10*3/uL (ref 140–400)
RBC: 4.42 10*6/uL (ref 4.20–5.80)
RDW: 11.5 % (ref 11.0–15.0)
TOTAL LYMPHOCYTE: 34.6 %
WBC mixed population: 542 cells/uL (ref 200–950)
WBC: 6.3 10*3/uL (ref 3.8–10.8)

## 2018-01-22 NOTE — Telephone Encounter (Signed)
Jeff Russell came by the office for lab work today. He reports that he has been having bouts of palpitations and tachycardia.  He reports he used to see Dr.Ganji, but it has been several years and he would like a referral to re-establish care with [name].  I will place the referral for further evaluation.

## 2018-01-25 NOTE — Progress Notes (Signed)
LFTs mildly elevated.  We will continue to monitor.

## 2018-02-01 DIAGNOSIS — B351 Tinea unguium: Secondary | ICD-10-CM | POA: Diagnosis not present

## 2018-02-01 DIAGNOSIS — B356 Tinea cruris: Secondary | ICD-10-CM | POA: Diagnosis not present

## 2018-02-01 DIAGNOSIS — B353 Tinea pedis: Secondary | ICD-10-CM | POA: Diagnosis not present

## 2018-02-11 DIAGNOSIS — Z0189 Encounter for other specified special examinations: Secondary | ICD-10-CM | POA: Diagnosis not present

## 2018-02-11 DIAGNOSIS — R002 Palpitations: Secondary | ICD-10-CM | POA: Diagnosis not present

## 2018-02-11 DIAGNOSIS — R079 Chest pain, unspecified: Secondary | ICD-10-CM | POA: Diagnosis not present

## 2018-02-11 DIAGNOSIS — E782 Mixed hyperlipidemia: Secondary | ICD-10-CM | POA: Diagnosis not present

## 2018-02-17 DIAGNOSIS — R002 Palpitations: Secondary | ICD-10-CM | POA: Diagnosis not present

## 2018-02-17 DIAGNOSIS — R0789 Other chest pain: Secondary | ICD-10-CM | POA: Diagnosis not present

## 2018-02-20 ENCOUNTER — Other Ambulatory Visit: Payer: Self-pay | Admitting: Rheumatology

## 2018-02-22 NOTE — Telephone Encounter (Signed)
Last Visit: 10/16/17 Next visit: 03/19/18 Labs: 01/22/18 LFTs mildly elevated. We will continue to monitor. Tb Gold: 04/15/17 Neg  Okay to refill per Dr.Deveshwar

## 2018-03-02 DIAGNOSIS — R002 Palpitations: Secondary | ICD-10-CM | POA: Diagnosis not present

## 2018-03-02 DIAGNOSIS — R079 Chest pain, unspecified: Secondary | ICD-10-CM | POA: Diagnosis not present

## 2018-03-08 DIAGNOSIS — I209 Angina pectoris, unspecified: Secondary | ICD-10-CM | POA: Diagnosis not present

## 2018-03-08 DIAGNOSIS — I471 Supraventricular tachycardia: Secondary | ICD-10-CM | POA: Diagnosis not present

## 2018-03-08 DIAGNOSIS — R002 Palpitations: Secondary | ICD-10-CM | POA: Diagnosis not present

## 2018-03-08 DIAGNOSIS — I472 Ventricular tachycardia: Secondary | ICD-10-CM | POA: Diagnosis not present

## 2018-03-08 NOTE — Progress Notes (Signed)
Office Visit Note  Patient: Jeff Russell             Date of Birth: 1965/05/04           MRN: [id number]             PCP: Lucretia Kern, DO Referring: Lucretia Kern, DO Visit Date: 03/19/2018 Occupation: @GUAROCC @  Subjective:  Pain in both hands   History of Present Illness: Jeff Russell is a 52 y.o. male male with history of seropositive rheumatoid arthritis and osteoarthritis. He is on Actemra sq injections once weekly and SSZ 500 mg 2 tablets by mouth BID.  Patient reports he feels like Actemra and SSZ have been a good combination. He continues to have pain in both hands and feet.  He is questioning if his feet pain is due to neuropathy.  He has no joint swelling.  He states he has noticed increased muscle soreness and inflexibility in his hamstrings.  He reports he has been following up with [name] on a regular basis.  He is taking metoprolol and Crestor.  He is going to be seeing Dr. Lovena Le to discuss a cardiac ablation in the near future.  He continues to have episodes of tachycardia and shortness of breath.     Activities of Daily Living:  Patient reports morning stiffness for 10-15  minutes.   Patient Reports nocturnal pain.  Difficulty dressing/grooming: Denies Difficulty climbing stairs: Reports Difficulty getting out of chair: Reports Difficulty using hands for taps, buttons, cutlery, and/or writing: Denies  Review of Systems  Constitutional: Positive for fatigue. Negative for night sweats.  HENT: Positive for mouth dryness. Negative for mouth sores and nose dryness.   Eyes: Negative for redness, visual disturbance and dryness.  Respiratory: Positive for shortness of breath (With palpitations). Negative for cough, hemoptysis and difficulty breathing.   Cardiovascular: Positive for palpitations. Negative for chest pain, hypertension, irregular heartbeat and swelling in legs/feet.  Gastrointestinal: Negative for blood in stool, constipation and diarrhea.    Endocrine: Negative for increased urination.  Genitourinary: Negative for painful urination.  Musculoskeletal: Positive for arthralgias, joint pain, joint swelling and morning stiffness. Negative for myalgias, muscle weakness, muscle tenderness and myalgias.  Skin: Positive for rash. Negative for color change, hair loss, nodules/bumps, skin tightness, ulcers and sensitivity to sunlight.  Allergic/Immunologic: Negative for susceptible to infections.  Neurological: Negative for dizziness, fainting, memory loss, night sweats and weakness.  Hematological: Negative for swollen glands.  Psychiatric/Behavioral: Negative for depressed mood and sleep disturbance. The patient is not nervous/anxious.     PMFS History:  Patient Active Problem List   Diagnosis Date Noted  . Chest pain on exertion 03/15/2018  . Muscle weakness (generalized) 03/06/2017  . Weakness 03/04/2017  . Gait abnormality 03/04/2017  . Low back pain without sciatica 03/04/2017  . Spondylolisthesis of lumbar region 07/16/2016  . High risk medication use 07/14/2016  . 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 Primary osteoarthritis of both knees 07/14/2016  . 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 Vertebral compression fracture ([place]) 07/14/2016  . History of MRSA infection 07/14/2016  . Osteopenia of multiple sites 07/14/2016  . Plantar fasciitis of left foot 07/14/2016  . COPD 0 / [place] smoking 02/28/2016  . Multiple pulmonary nodules 11/21/2015  . Multiple environmental allergies 05/16/2014  . Cigarette smoker 05/16/2014  . Rheumatoid arthritis ([place]) 05/01/2011    Past Medical History:  Diagnosis Date  . Allergy   . Arthritis   . COPD (chronic  obstructive pulmonary disease) ([place])    per pt  . GERD (gastroesophageal reflux disease)   . History of MRSA infection    right leg per patient  . MSSA (methicillin susceptible Staphylococcus aureus) infection 05/01/2011  . Pneumonia   . Rheumatoid nodules ([place]) 08/06/2014    right lung-per CT scan ordered by Dr Estanislado Pandy  . Septic bursitis 05/01/2011  . Shortness of breath    occ    Family History  Problem Relation Age of Onset  . Arthritis Maternal Grandmother   . Angina Maternal Grandmother   . Colon cancer Paternal Grandfather        deceased  . Prostate cancer Father   . Hypercholesterolemia Mother   . Heart disease Mother        palpitations, ?heart failure  . Hypertension Mother   . Other Mother        Heart enlargement,left ventricle problems  . Congestive Heart Failure Paternal Grandmother   . Esophageal cancer Neg Hx   . Rectal cancer Neg Hx   . Stomach cancer Neg Hx    Past Surgical History:  Procedure Laterality Date  . BACK SURGERY    . carpel tunnel Bilateral   . CHOLECYSTECTOMY    . COLONOSCOPY    . EPIDURAL BLOCK INJECTION    . EYE SURGERY Left    wire in eye  . I&D EXTREMITY  03/24/2011   Procedure: IRRIGATION AND DEBRIDEMENT EXTREMITY;  Surgeon: Yvette Rack., MD;  Location: [facility];  Service: Orthopedics;  Laterality: Right;  Irrigation and debridement right knee abcess and Bursa,  application of wound vac  . I&D EXTREMITY  03/28/2011   Procedure: IRRIGATION AND DEBRIDEMENT EXTREMITY;  Surgeon: Yvette Rack., MD;  Location: [facility];  Service: Orthopedics;  Laterality: Right;  I&D right knee with wound closure  . LEFT HEART CATH AND CORONARY ANGIOGRAPHY N/A 03/16/2018   Procedure: LEFT HEART CATH AND CORONARY ANGIOGRAPHY;  Surgeon: Adrian Prows, MD;  Location: [facility];  Service: Cardiovascular;  Laterality: N/A;  . MENISCUS REPAIR Right 2017   Social History   Social History Narrative   Work or School: lawn care      Home Situation: lives with wife whom sees me as well       Spiritual Beliefs: believes in God; does not go to church      Lifestyle: no CV exercise - but physically active at work; diet is poor       Objective: Vital Signs: BP 117/80 (BP Location: Left Arm, Patient Position: Sitting, Cuff  Size: Large)   Pulse 96   Resp 14   Ht 6\' 3"  (1.905 m)   Wt 254 lb 9.6 oz (115.5 kg)   BMI 31.82 kg/m    Physical Exam Vitals signs and nursing note reviewed.  Constitutional:      Appearance: He is well-developed.  HENT:     Head: Normocephalic and atraumatic.  Eyes:     Conjunctiva/sclera: Conjunctivae normal.     Pupils: Pupils are equal, round, and reactive to light.  Neck:     Musculoskeletal: Normal range of motion and neck supple.  Cardiovascular:     Rate and Rhythm: Normal rate and regular rhythm.     Heart sounds: Normal heart sounds.  Pulmonary:     Effort: Pulmonary effort is normal.     Breath sounds: Normal breath sounds.  Abdominal:     General: Bowel sounds are normal.     Palpations:  Abdomen is soft.  Lymphadenopathy:     Cervical: No cervical adenopathy.  Skin:    General: Skin is warm and dry.     Capillary Refill: Capillary refill takes less than 2 seconds.  Neurological:     Mental Status: He is alert and oriented to person, place, and time.  Psychiatric:        Behavior: Behavior normal.      Musculoskeletal Exam: C-spine limited ROM.  Thoracic kyphosis. Full painful ROM of both shoulder joints.  Right mild flexion contracture. Left elbow full ROM.  Limited ROM of both wrist joints, especially right wrist joint. Synovial thickening right 2nd and 3rd MCP joints.  Hip joints, knee joints, ankle joints, MTPs, PIPs, and DIPs good ROM with no synovitis.  No warmth or effusion of knee joints.  No tenderness or swelling ankle joints.  No tenderness of trochanteric bursa bilaterally.   CDAI Exam: CDAI Score: Not documented Patient Global Assessment: Not documented; Provider Global Assessment: Not documented Swollen: Not documented; Tender: Not documented Joint Exam   Not documented   There is currently no information documented on the homunculus. Go to the Rheumatology activity and complete the homunculus joint exam.  Investigation: No additional  findings.  Imaging: No results found.  Recent Labs: Lab Results  Component Value Date   WBC 6.3 01/22/2018   HGB 15.7 01/22/2018   PLT 192 01/22/2018   NA 138 01/22/2018   K 3.5 03/16/2018   CL 103 01/22/2018   CO2 27 01/22/2018   GLUCOSE 88 01/22/2018   BUN 12 01/22/2018   CREATININE 1.06 01/22/2018   BILITOT 0.5 01/22/2018   ALKPHOS 125 (H) 09/26/2016   AST 39 (H) 01/22/2018   ALT 34 01/22/2018   PROT 6.6 01/22/2018   ALBUMIN 4.2 09/26/2016   CALCIUM 9.7 01/22/2018   GFRAA 93 01/22/2018   QFTBGOLDPLUS NEGATIVE 04/15/2017    Speciality Comments: No specialty comments available.  Procedures:  No procedures performed Allergies: Doxycycline and Humira [adalimumab]    Assessment / Plan:     Visit Diagnoses: Rheumatoid arthritis involving multiple sites with positive rheumatoid factor ([place]): He has no active synovitis at this time.  He has synovial thickening of the right 2nd and 3rd MCPs.  He has not had any recent rheumatoid arthritis flares.  He is clinically doing better on Actemra 162 mg sq weekly injections and Sulfasalazine 500 mg 2 tablets by mouth twice daily.  He has not missed any doses recently.  He will continue on this current treatment regimen.  He does not need refills at this time.  He was advised to notify us if he develops increased joint pain or joint swelling.  He will follow up in 5 months.  Association of heart disease with rheumatoid arthritis was discussed. Need to monitor blood pressure, cholesterol, and to exercise 30-60 minutes on daily basis was discussed. Poor dental hygiene can be a predisposing factor for rheumatoid arthritis. Good dental hygiene was discussed.  He is following up closely with [name].  He will be scheduled for a cardiac ablation.  He continues to have tachycardia and intermittent shortness of breath.   High risk medication use Actemra 162 mg weekly and sulfasalazine 1000 mg twice daily.  Last TB gold negative on 04/15/2017.  LDL  was 58 on 03/16/18.  He continues to have lipid panel checked on a regular basis by [name].  He will have lab work faxed to [place]. He is on Crestor managed by his PCP.  Most recent CBC/CMP within normal limits except for mildly elevated LFTs on 01/22/2018.  Next CBC/CMP due in January and then every 3 months. Standing orders are in place.  X-ray of bilateral hands and feet taken on 04/15/2017. Future order for TB gold placed today.-- Plan: QuantiFERON-TB Gold Plus, Lipid panel  Primary osteoarthritis of both knees: No warmth or effusion.  Good ROM with no discomfort.  He has some difficulty climbing steps and getting up from a chair at times.   Plantar fasciitis of left foot: Resolved.   Spondylolisthesis of lumbar region: He has limited ROM of the lumbar spine.    Osteopenia of multiple sites: He takes a vitamin D supplement on a daily basis.   Other medical conditions are listed as follows:   History of vertebral fracture  Multiple pulmonary nodules  Smoker  History of MRSA infection  History of COPD   Orders: Orders Placed This Encounter  Procedures  . QuantiFERON-TB Gold Plus  . Lipid panel   No orders of the defined types were placed in this encounter.    Follow-Up Instructions: Return in about 5 months (around 08/18/2018) for Rheumatoid arthritis, Osteoarthritis.   Ofilia Neas, PA-C  Note - This record has been created using Dragon software.  Chart creation errors have been sought, but may not always  have been located. Such creation errors do not reflect on  the standard of medical care.

## 2018-03-09 DIAGNOSIS — I209 Angina pectoris, unspecified: Secondary | ICD-10-CM | POA: Diagnosis not present

## 2018-03-09 DIAGNOSIS — E782 Mixed hyperlipidemia: Secondary | ICD-10-CM | POA: Diagnosis not present

## 2018-03-12 DIAGNOSIS — R002 Palpitations: Secondary | ICD-10-CM | POA: Diagnosis not present

## 2018-03-15 DIAGNOSIS — R079 Chest pain, unspecified: Secondary | ICD-10-CM | POA: Diagnosis present

## 2018-03-15 NOTE — H&P (Signed)
OFFICE VISIT NOTES COPIED TO EPIC FOR DOCUMENTATION  . History of Present Illness Laverda Page MD; 03/08/2018 9:17 PM) Patient words: fu appt;last OV 02/11/2018.  The patient is a 52 year old male who presents for a Follow-up for Chest pain.  Additional reasons for visit:  Follow-up for Palpitations is described as the following: Mr. Jeff Russell is a Caucasian male with history of seropositive rheumatoid arthritis and osteoarthritis, chronic low back pain and sciatica, spondylolisthesis of lumbar region, morbid obesity, multiple pulmonary nodules, btobacco use disorder, COPD presents here for follow-up of symptoms of angina pectoris and also palpitations.  His main complaints are rapid onset and offset of palpitations that started years ago, but since he laid off of caffeine, symptoms improved.However, the past 2 years. His symptoms have recurred again. Episodes are sudden in onset, can last from 30 minutes to sometimes many hours.  He also complains of exertional chest tightness, he works in [employer], he is also noticed episodes of palpitations with exertion activity with radiation of chest pain to his neck and also to his left arm. He has 60-pack-year history of tobacco use. Underwent stress testing and echo and is presently wearing event monitor and presents for f/u and seen earlier than scheduled as event monitor revealed frequent episodes of non sustained PSVT with abberancy and also had frequent PVC and 2 runs of NSVT.   Problem List/Past Medical Georgeanna Harrison; 03/08/2018 2:38 PM) Allergies (T78.40XA)  Rheumatoid arthritis involving multiple joints (M06.9)  Palpitations (R00.2)  EKG 02/11/2018: Normal sinus rhythm at the rate of 78 bpm, normal axis, single PVC. Otherwise normal EKG. 1 min narrow complex tachycardia at 240 bpm on 11/27 at 1:36 PM Laboratory examination (Z01.89)  Labs 07/17/2017: HB 14.9/HCT 43.2, platelets 224. BUN 14, creatinine 0.97,  potassium 4.8, eGFR greater than 60 well, LFT normal. Labs 03/04/2017: Vitamin D 27.2. TSH 1.980, normal. A1c was 0%. Total cholesterol 156, triglycerides 178, HDL 32, LDL 88. Exertional chest pain (R07.9)  Exercise sestamibi stress test 02/17/2018: 1. The resting electrocardiogram demonstrated normal sinus rhythm and incomplete RBBB, occasional PAC and PVC. During stress there were frequent PVC and also V-Bigeminy, which decreased as exercise progressed. There were frequent PAC as well. No ST-T changes of ischemia. Patient exercised on Bruce protocol for 6:30 minutes and achieved 7.74 METS. Stress test terminated due to dyspnea and 89% MPHR achieved (Target HR >85%). 2. Perfusion imaging study reveals antero-septal thinning without ischemia or scar. There is moderate sized mild ischemia in the inferior and lateral ischemia at the base and mid ventricle. LVEF calculated at 39% with inferior hypokinesis. Intermediate risk. Echocardiogram 03/02/2018: Left ventricle cavity is normal in size. Mild concentric hypertrophy of the left ventricle. Low normal decrease in global wall motion. Visual EF is 50-55%. Doppler evidence of grade I (impaired) diastolic dysfunction, normal LAP. Left ventricle regional wall motion findings: No wall motion abnormalities. Calculated EF 45%. Left atrial cavity is moderately dilated. No hemodynamically significant valvular abnormality. The aortic root is mildly dilated at 4.0 cm, probably normal for patient's body habitus. IVC is dilated with blunted respiratory response. Mixed hyperlipidemia (E78.2)   Allergies Georgeanna Harrison; 03/08/2018 2:38 PM) Doxycycline Hyclate *TETRACYCLINES*  burning skin Humira Pen *ANALGESICS - ANTI-INFLAMMATORY*  Rash, Itching.  Family History Georgeanna Harrison; 03/08/2018 2:38 PM) Mother  In good health. CHF, EF 40% age 30 years 02/09/2018: Sister 1  older by 3 years; [facility] Father  Deceased. 02-03-17 passed; [facility]  Social History Georgeanna Harrison;  03-14-18 2:38 PM) Current tobacco use  Heavy tobacco smoker, Smokes 2 packs of cigarettes per day. 60 pack year history Non Drinker/No Alcohol Use  Marital status  Married. Living Situation  Lives with spouse. Number of Children  4.  Past Surgical History Georgeanna Harrison; 14-Mar-2018 2:38 PM) Back Surgery [2017]:  Medication History Laverda Page, MD; 03/14/18 9:20 PM) dilTIAZem HCl (30MG Tablet, 1 (one) Tablet Oral Every 8 hours as needed, Taken starting 03/04/2018) Active. (For episodes of palpitations not releived nby Vagal manuevres) Cyclobenzaprine HCl (10MG Tablet, 1 Oral three times daily as needed) Active. Esomeprazole Magnesium (20MG Capsule DR, 1 Oral daily) Active. Fluticasone Propionate (Nasal) (50MCG/ACT Suspension, Nasal as needed) Active. Gabapentin (300MG Capsule, 1 Oral three times daily) Active. Methocarbamol (500MG Tablet, 1 Oral as needed) Active. sulfaSALAzine (500MG Tablet DR, 2 Oral two times daily) Active. Tylenol Arthritis Pain (650MG Tablet ER, Oral as needed) Active. Vitamin D (Cholecalciferol) (1000UNIT Capsule, 1 Oral daily) Active. Actemra (162MG/0.9ML Soln Pref Syr, Subcutaneous weekly) Active. Terbinafine HCl (250MG Tablet, 1 Oral daily for 30 days) Active. Medications Reconciled (verbally)  Diagnostic Studies History Georgeanna Harrison; 2018-03-14 2:38 PM) Echocardiogram  Treadmill stress test     Review of Systems Laverda Page, MD; 2018-03-14 9:20 PM) General Not Present- Appetite Loss and Weight Gain. Respiratory Not Present- Chronic Cough and Wakes up from Sleep Wheezing or Short of Breath. Cardiovascular Present- Chest Pain and Rapid Heart Rate. Gastrointestinal Not Present- Black, Tarry Stool and Difficulty Swallowing. Musculoskeletal Not Present- Decreased Range of Motion and Muscle Atrophy. Neurological Not Present- Attention Deficit. Psychiatric Not Present- Personality Changes and Suicidal  Ideation. Endocrine Not Present- Cold Intolerance and Heat Intolerance. Hematology Not Present- Abnormal Bleeding. All other systems negative  Vitals Georgeanna Harrison; 03-14-2018 2:45 PM) 03-14-18 2:41 PM Weight: 253.31 lb Height: 75in Body Surface Area: 2.43 m Body Mass Index: 31.66 kg/m  Pulse: 63 (Regular)  P.OX: 100% (Room air) BP: 119/65 (Sitting, Left Arm, Standard)       Physical Exam Laverda Page, MD; Mar 14, 2018 9:20 PM) General Mental Status-Alert. General Appearance-Cooperative and Appears stated age. Build & Nutrition-Muscular and Well built.  Head and Neck Thyroid Gland Characteristics - normal size and consistency and no palpable nodules.  Chest and Lung Exam Chest and lung exam reveals -quiet, even and easy respiratory effort with no use of accessory muscles, non-tender and on auscultation, normal breath sounds, no adventitious sounds.  Cardiovascular Cardiovascular examination reveals -normal heart sounds, regular rate and rhythm with no murmurs, carotid auscultation reveals no bruits, abdominal aorta auscultation reveals no bruits and no prominent pulsation, femoral artery auscultation bilaterally reveals normal pulses, no bruits, no thrills, normal pedal pulses bilaterally and no digital clubbing, cyanosis, edema, increased warmth or tenderness.  Abdomen Palpation/Percussion Normal exam - Non Tender and No hepatosplenomegaly.  Neurologic Neurologic evaluation reveals -alert and oriented x 3 with no impairment of recent or remote memory. Motor-Grossly intact without any focal deficits.  Musculoskeletal Global Assessment Left Lower Extremity - no deformities, masses or tenderness, no known fractures. Right Lower Extremity - no deformities, masses or tenderness, no known fractures.    Assessment & Plan Laverda Page MD; March 14, 2018 9:20 PM) Palpitations (R00.2) Story: EKG 02/11/2018: Normal sinus rhythm at the rate of  78 bpm, normal axis, single PVC. Otherwise normal EKG.  Monitor 30 days 03/02/2018: Paroxysmal episodes of symptomatic SVT with aberrancy, maximum and to tolerate 210 bpm.  Occasional PVCs. Symptomatic with palpitations different from SVT: One episode of 4 beat run  of ventricular tachycardia and 9 beats run of ventricular tachycardia noted at 05:50 p.m. NSVT (nonsustained ventricular tachycardia) (I47.2) Story: Event monitor day 6 on 03/07/2018:4 beat run of NSVT and 9 beat run of NSVT. PVCs. Symptomatic with palpitations different from SVT. Angina pectoris (I20.9) Story: Exercise sestamibi stress test 02/17/2018: 1. The resting electrocardiogram demonstrated normal sinus rhythm and incomplete RBBB, occasional PAC and PVC. During stress there were frequent PVC and also V-Bigeminy, which decreased as exercise progressed. There were frequent PAC as well. No ST-T changes of ischemia. Patient exercised on Bruce protocol for 6:30 minutes and achieved 7.74 METS. Stress test terminated due to dyspnea and 89% MPHR achieved (Target HR >85%). 2. Perfusion imaging study reveals antero-septal thinning without ischemia or scar. There is moderate sized mild ischemia in the inferior and lateral ischemia at the base and mid ventricle. LVEF calculated at 39% with inferior hypokinesis. Intermediate risk.  Echocardiogram 03/02/2018: Left ventricle cavity is normal in size. Mild concentric hypertrophy of the left ventricle. Low normal decrease in global wall motion. Visual EF is 50-55%. Doppler evidence of grade I (impaired) diastolic dysfunction, normal LAP. Left ventricle regional wall motion findings: No wall motion abnormalities. Calculated EF 45%. Left atrial cavity is moderately dilated. No hemodynamically significant valvular abnormality. The aortic root is mildly dilated at 4.0 cm, probably normal for patient's body habitus. IVC is dilated with blunted respiratory response. Current Plans Started Metoprolol  Succinate ER 50MG, 1 (one) Tablet daily, #30, 30 days starting 03/08/2018, Ref. x2. Started Aspirin 81MG, 1 (one) Tablet daily, #30, 30 days starting 03/08/2018, Ref. x11, Mail Order #90, 90 days, Ref. x3. METABOLIC PANEL, BASIC (88916) CBC & PLATELETS (AUTO) (94503) PSVT (paroxysmal supraventricular tachycardia) (I47.1) Mixed hyperlipidemia (E78.2) Current Plans Started Rosuvastatin Calcium 20MG, 1 (one) Tablet daily, #30, 30 days starting 03/08/2018, Ref. x2. LIPID PANEL (88828) Laboratory examination (Z01.89) 03/09/2018: Creatinine 1.25, EGFR 66/76, potassium 5.8, BMP otherwise normal.  Normal H&H, macrocytic indices, CBC otherwise normal.  Cholesterol 179, triglycerides 481, HDL 28, LDL unable to be calculated.  Labs 01/22/2018: Serum glucose 88, BUN 12, creatinine 1.06, eGFR greater than 60 him up, CMP otherwise normal. HB 15.7/HCT 44.6, mild macrocytosis percent, platelets 192.  Labs 07/17/2017: HB 14.9/HCT 43.2, platelets 224. BUN 14, creatinine 0.97, potassium 4.8, eGFR greater than 60 mL LFT normal.  Labs 03/04/2017: Vitamin D 27.2. TSH 1.980, normal. A1c was 0%. Total cholesterol 156, triglycerides 178, HDL 32, LDL 88.  Recommendations:  Mr. Jeff Russell is a Caucasian male with history of seropositive rheumatoid arthritis and osteoarthritis, chronic low back pain and sciatica, spondylolisthesis of lumbar region, morbid obesity, multiple pulmonary nodules, tobacco use disorder, COPD presents here for follow-up of symptoms of angina pectoris and also palpitations. Underwent stress testing and echo and is presently wearing event monitor and presents for f/u and seen earlier than scheduled as event monitor revealed frequent episodes of non sustained PSVT with aberrancy and also had frequent PVC and 2 runs of NSVT.  I have evaluated his event monitor, it reveals episodes of PSVT although they're brief in duration. He will eventually need EP evaluation.  He will continue diltiazem CD on  a p.r.n. basis but I have started him on metoprolol succinate 50 mg daily in view of symptoms suggestive of angina pectoris and also abnormal nuclear stress test which is really a an intermediate if not high risk study and also in view of NSVT, I would consider overall clinical risk to be high risk. He also has reduced LVEF  by stress testing. Smoking cessation again discussed.  He will need coronary angiography. I started him on Crestor 20 mg daily. Prior to starting the medication, he'll obtain labs. Patient instructed to start ASA 44m q daily for prophylaxis.   Patient instructed not to do heavy lifting, heavy exertional activity, swimming until evaluation is complete. Patient instructed to call if symptoms worse or to go to the ED for further evaluation. Valsalva strain: I have explained to the patient the mechanism of PSVT, explained Valsalva maneuver, straining, splashing cold water on her face if there is persistent palpitations. Patient also advised to go to the nearest medical facility to obtain a EKG to confirm presence of PSVT. Also discussed if the usual maneuvers do not improve the symptoms to go to the emergency department.  This was a greater than 40 minute office visit with greater than 50% of the time spent with face-to-face encounter with patient and evaluation of complex medical issues, review of external records and coordination of care.  CC: THazel Sams PA-C ([place]); CC: HColin Benton DO    Signed by JLaverda Page MD (03/08/2018 9:21 PM)

## 2018-03-16 ENCOUNTER — Other Ambulatory Visit: Payer: Self-pay

## 2018-03-16 ENCOUNTER — Ambulatory Visit (HOSPITAL_COMMUNITY)
Admission: RE | Admit: 2018-03-16 | Discharge: 2018-03-16 | Disposition: A | Discharge status: Home / Self Care | Payer: BLUE CROSS/BLUE SHIELD | Attending: Cardiology | Admitting: Cardiology

## 2018-03-16 ENCOUNTER — Ambulatory Visit (HOSPITAL_COMMUNITY)
Admission: RE | Disposition: A | Discharge status: Home / Self Care | Payer: Self-pay | Source: Home / Self Care | Attending: Cardiology

## 2018-03-16 ENCOUNTER — Encounter (HOSPITAL_COMMUNITY): Payer: Self-pay | Admitting: Cardiology

## 2018-03-16 DIAGNOSIS — Z9889 Other specified postprocedural states: Secondary | ICD-10-CM | POA: Insufficient documentation

## 2018-03-16 DIAGNOSIS — R002 Palpitations: Secondary | ICD-10-CM | POA: Insufficient documentation

## 2018-03-16 DIAGNOSIS — R079 Chest pain, unspecified: Secondary | ICD-10-CM | POA: Insufficient documentation

## 2018-03-16 DIAGNOSIS — I472 Ventricular tachycardia: Secondary | ICD-10-CM | POA: Diagnosis not present

## 2018-03-16 DIAGNOSIS — Z79899 Other long term (current) drug therapy: Secondary | ICD-10-CM | POA: Diagnosis not present

## 2018-03-16 DIAGNOSIS — M069 Rheumatoid arthritis, unspecified: Secondary | ICD-10-CM | POA: Insufficient documentation

## 2018-03-16 DIAGNOSIS — R0789 Other chest pain: Secondary | ICD-10-CM | POA: Diagnosis not present

## 2018-03-16 DIAGNOSIS — F17210 Nicotine dependence, cigarettes, uncomplicated: Secondary | ICD-10-CM | POA: Diagnosis not present

## 2018-03-16 DIAGNOSIS — E782 Mixed hyperlipidemia: Secondary | ICD-10-CM | POA: Diagnosis not present

## 2018-03-16 DIAGNOSIS — Z6831 Body mass index (BMI) 31.0-31.9, adult: Secondary | ICD-10-CM | POA: Insufficient documentation

## 2018-03-16 DIAGNOSIS — Z8249 Family history of ischemic heart disease and other diseases of the circulatory system: Secondary | ICD-10-CM | POA: Insufficient documentation

## 2018-03-16 DIAGNOSIS — Z881 Allergy status to other antibiotic agents status: Secondary | ICD-10-CM | POA: Insufficient documentation

## 2018-03-16 DIAGNOSIS — J449 Chronic obstructive pulmonary disease, unspecified: Secondary | ICD-10-CM | POA: Insufficient documentation

## 2018-03-16 DIAGNOSIS — Z888 Allergy status to other drugs, medicaments and biological substances status: Secondary | ICD-10-CM | POA: Insufficient documentation

## 2018-03-16 DIAGNOSIS — R918 Other nonspecific abnormal finding of lung field: Secondary | ICD-10-CM | POA: Insufficient documentation

## 2018-03-16 HISTORY — PX: LEFT HEART CATH AND CORONARY ANGIOGRAPHY: CATH118249

## 2018-03-16 LAB — POTASSIUM: Potassium: 3.5 mmol/L (ref 3.5–5.1)

## 2018-03-16 SURGERY — LEFT HEART CATH AND CORONARY ANGIOGRAPHY
Anesthesia: LOCAL

## 2018-03-16 MED ORDER — HEPARIN SODIUM (PORCINE) 1000 UNIT/ML IJ SOLN
INTRAMUSCULAR | Status: DC | PRN
  Administered 2018-03-16: 7500 [IU] via INTRAVENOUS

## 2018-03-16 MED ORDER — ACETAMINOPHEN 325 MG PO TABS: 650.0000 mg | ORAL_TABLET | ORAL | Status: DC | PRN

## 2018-03-16 MED ORDER — HEPARIN (PORCINE) IN NACL 1000-0.9 UT/500ML-% IV SOLN
INTRAVENOUS | Status: AC
  Filled 2018-03-16: qty 500

## 2018-03-16 MED ORDER — SODIUM CHLORIDE 0.9% FLUSH: 3.0000 mL | Freq: Two times a day (BID) | INTRAVENOUS | Status: DC

## 2018-03-16 MED ORDER — IOHEXOL 350 MG/ML SOLN
INTRAVENOUS | Status: DC | PRN
  Administered 2018-03-16: 35 mL via INTRAVENOUS

## 2018-03-16 MED ORDER — HYDROMORPHONE HCL 1 MG/ML IJ SOLN
INTRAMUSCULAR | Status: AC
  Filled 2018-03-16: qty 0.5

## 2018-03-16 MED ORDER — SODIUM CHLORIDE 0.9 % IV SOLN: 250.0000 mL | INTRAVENOUS | Status: DC | PRN

## 2018-03-16 MED ORDER — HEPARIN (PORCINE) IN NACL 1000-0.9 UT/500ML-% IV SOLN
INTRAVENOUS | Status: DC | PRN
  Administered 2018-03-16 (×2): 500 mL

## 2018-03-16 MED ORDER — LIDOCAINE HCL (PF) 1 % IJ SOLN
INTRAMUSCULAR | Status: AC
  Filled 2018-03-16: qty 30

## 2018-03-16 MED ORDER — SODIUM CHLORIDE 0.9 % WEIGHT BASED INFUSION: 1.0000 mL/kg/h | INTRAVENOUS | Status: DC

## 2018-03-16 MED ORDER — SODIUM CHLORIDE 0.9% FLUSH: 3.0000 mL | INTRAVENOUS | Status: DC | PRN

## 2018-03-16 MED ORDER — SODIUM CHLORIDE 0.9 % IV SOLN
INTRAVENOUS | Status: AC | PRN
  Administered 2018-03-16: 250 mL via INTRAVENOUS

## 2018-03-16 MED ORDER — MIDAZOLAM HCL 2 MG/2ML IJ SOLN
INTRAMUSCULAR | Status: DC | PRN
  Administered 2018-03-16: 2 mg via INTRAVENOUS

## 2018-03-16 MED ORDER — NITROGLYCERIN 1 MG/10 ML FOR IR/CATH LAB
INTRA_ARTERIAL | Status: AC
  Filled 2018-03-16: qty 10

## 2018-03-16 MED ORDER — MIDAZOLAM HCL 2 MG/2ML IJ SOLN
INTRAMUSCULAR | Status: AC
  Filled 2018-03-16: qty 2

## 2018-03-16 MED ORDER — ONDANSETRON HCL 4 MG/2ML IJ SOLN: 4.0000 mg | Freq: Four times a day (QID) | INTRAMUSCULAR | Status: DC | PRN

## 2018-03-16 MED ORDER — HYDROMORPHONE HCL 1 MG/ML IJ SOLN
INTRAMUSCULAR | Status: DC | PRN
  Administered 2018-03-16: 0.5 mg via INTRAVENOUS

## 2018-03-16 MED ORDER — VERAPAMIL HCL 2.5 MG/ML IV SOLN
INTRAVENOUS | Status: DC | PRN
  Administered 2018-03-16: 08:00:00 via INTRA_ARTERIAL

## 2018-03-16 MED ORDER — LIDOCAINE HCL (PF) 1 % IJ SOLN
INTRAMUSCULAR | Status: DC | PRN
  Administered 2018-03-16: 2 mL

## 2018-03-16 MED ORDER — ASPIRIN 81 MG PO CHEW: 81.0000 mg | CHEWABLE_TABLET | ORAL | Status: DC

## 2018-03-16 MED ORDER — SODIUM CHLORIDE 0.9 % WEIGHT BASED INFUSION
3.0000 mL/kg/h | INTRAVENOUS | Status: DC
  Administered 2018-03-16: 3 mL/kg/h via INTRAVENOUS

## 2018-03-16 SURGICAL SUPPLY — 9 items
CATH OPTITORQUE TIG 4.0 5F (CATHETERS) ×2 IMPLANT
DEVICE RAD COMP TR BAND LRG (VASCULAR PRODUCTS) ×2 IMPLANT
GLIDESHEATH SLEND A-KIT 6F 20G (SHEATH) ×2 IMPLANT
GUIDEWIRE INQWIRE 1.5J.035X260 (WIRE) ×1 IMPLANT
INQWIRE 1.5J .035X260CM (WIRE) ×2
KIT HEART LEFT (KITS) ×2 IMPLANT
PACK CARDIAC CATHETERIZATION (CUSTOM PROCEDURE TRAY) ×2 IMPLANT
TRANSDUCER W/STOPCOCK (MISCELLANEOUS) ×2 IMPLANT
TUBING CIL FLEX 10 FLL-RA (TUBING) ×2 IMPLANT

## 2018-03-16 NOTE — Discharge Instructions (Signed)
Radial Site Care °Refer to this sheet in the next few weeks. These instructions provide you with information about caring for yourself after your procedure. Your health care provider may also give you more specific instructions. Your treatment has been planned according to current medical practices, but problems sometimes occur. Call your health care provider if you have any problems or questions after your procedure. °What can I expect after the procedure? °After your procedure, it is typical to have the following: °· Bruising at the radial site that usually fades within 1-2 weeks. °· Blood collecting in the tissue (hematoma) that may be painful to the touch. It should usually decrease in size and tenderness within 1-2 weeks. ° °Follow these instructions at home: °· Take medicines only as directed by your health care provider. °· You may shower 24-48 hours after the procedure or as directed by your health care provider. Remove the bandage (dressing) and gently wash the site with plain soap and water. Pat the area dry with a clean towel. Do not rub the site, because this may cause bleeding. °· Do not take baths, swim, or use a hot tub until your health care provider approves. °· Check your insertion site every day for redness, swelling, or drainage. °· Do not apply powder or lotion to the site. °· Do not flex or bend the affected arm for 24 hours or as directed by your health care provider. °· Do not push or pull heavy objects with the affected arm for 24 hours or as directed by your health care provider. °· Do not lift over 10 lb (4.5 kg) for 5 days after your procedure or as directed by your health care provider. °· Ask your health care provider when it is okay to: °? Return to work or school. °? Resume usual physical activities or sports. °? Resume sexual activity. °· Do not drive home if you are discharged the same day as the procedure. Have someone else drive you. °· You may drive 24 hours after the procedure  unless otherwise instructed by your health care provider. °· Do not operate machinery or power tools for 24 hours after the procedure. °· If your procedure was done as an outpatient procedure, which means that you went home the same day as your procedure, a responsible adult should be with you for the first 24 hours after you arrive home. °· Keep all follow-up visits as directed by your health care provider. This is important. °Contact a health care provider if: °· You have a fever. °· You have chills. °· You have increased bleeding from the radial site. Hold pressure on the site. °Get help right away if: °· You have unusual pain at the radial site. °· You have redness, warmth, or swelling at the radial site. °· You have drainage (other than a small amount of blood on the dressing) from the radial site. °· The radial site is bleeding, and the bleeding does not stop after 30 minutes of holding steady pressure on the site. °· Your arm or hand becomes pale, cool, tingly, or numb. °This information is not intended to replace advice given to you by your health care provider. Make sure you discuss any questions you have with your health care provider. °Document Released: 04/26/2010 Document Revised: 08/30/2015 Document Reviewed: 10/10/2013 °Elsevier Interactive Patient Education © 2018 Else[employer] Inc[employer] ° ° ° °Moderate Conscious Sedation, Adult, Care After °These instructions provide you with information about caring for yourself after your procedure. Your health care provider   may also give you more specific instructions. Your treatment has been planned according to current medical practices, but problems sometimes occur. Call your health care provider if you have any problems or questions after your procedure. °What can I expect after the procedure? °After your procedure, it is common: °· To feel sleepy for several hours. °· To feel clumsy and have poor balance for several hours. °· To have poor judgment for several  hours. °· To vomit if you eat too soon. ° °Follow these instructions at home: °For at least 24 hours after the procedure: ° °· Do not: °? Participate in activities where you could fall or become injured. °? Drive. °? Use heavy machinery. °? Drink alcohol. °? Take sleeping pills or medicines that cause drowsiness. °? Make important decisions or sign legal documents. °? Take care of children on your own. °· Rest. °Eating and drinking °· Follow the diet recommended by your health care provider. °· If you vomit: °? Drink water, juice, or soup when you can drink without vomiting. °? Make sure you have little or no nausea before eating solid foods. °General instructions °· Have a responsible adult stay with you until you are awake and alert. °· Take over-the-counter and prescription medicines only as told by your health care provider. °· If you smoke, do not smoke without supervision. °· Keep all follow-up visits as told by your health care provider. This is important. °Contact a health care provider if: °· You keep feeling nauseous or you keep vomiting. °· You feel light-headed. °· You develop a rash. °· You have a fever. °Get help right away if: °· You have trouble breathing. °This information is not intended to replace advice given to you by your health care provider. Make sure you discuss any questions you have with your health care provider. °Document Released: 01/12/2013 Document Revised: 08/27/2015 Document Reviewed: 07/14/2015 °Elsevier Interactive Patient Education © 2018 Elsevier Inc. ° °

## 2018-03-16 NOTE — Interval H&P Note (Signed)
History and Physical Interval Note:  03/16/2018 8:05 AM  Jeff Russell  has presented today for surgery, with the diagnosis of angina  The various methods of treatment have been discussed with the patient and family. After consideration of risks, benefits and other options for treatment, the patient has consented to  Procedure(s): LEFT HEART CATH AND CORONARY ANGIOGRAPHY (N/A) and possible angioplasty as a surgical intervention .  The patient's history has been reviewed, patient examined, no change in status, stable for surgery.  I have reviewed the patient's chart and labs.  Questions were answered to the patient's satisfaction.   Symptom Status: Ischemic Symptoms Non-invasive Testing: Indeterminate If no or indeterminate stress test, FFR/iFR results in all diseased vessels: Not done Diabetes Mellitus: No S/P CABG: No Antianginal therapy (number of long-acting drugs): 1 Patient undergoing renal transplant: No Patient undergoing percutaneous valve procedure: No   1 Vessel Disease No proximal LAD involvement, No proximal left dominant LCX involvement  PCI: Not rated  CABG: Not rated Proximal left dominant LCX involvement  PCI: Not rated  CABG: Not rated Proximal LAD involvement  PCI: Not rated  CABG: Not rated  2 Vessel Disease No proximal LAD involvement  PCI: Not rated  CABG: Not rated Proximal LAD involvement  PCI: Not rated  CABG: Not rated  3 Vessel Disease Low disease complexity (e.g., focal stenoses, SYNTAX <=22)  PCI: Not rated  CABG: Not rated Intermediate or high disease complexity (e.g., SYNTAX >=23)  PCI: Not rated  CABG: Not rated  Left Main Disease Isolated LMCA disease: ostial or midshaft  PCI: A (7);  Indication 24  CABG: A (9);  Indication 24 Isolated LMCA disease: bifurcation involvement  PCI: M (5);  Indication 25  CABG: A (9);  Indication 25 LMCA ostial or midshaft, concurrent low disease burden multivessel disease (e.g., 1-2 additional focal  stenoses, SYNTAX <=22)  PCI: A (7);  Indication 26  CABG: A (9);  Indication 26 LMCA ostial or midshaft, concurrent intermediate or high disease burden multivessel disease (e.g., 1-2 additional bifurcation stenoses, long stenoses, SYNTAX >=23)  PCI: M (4);  Indication 27  CABG: A (9);  Indication 27 LMCA bifurcation involvement, concurrent low disease burden multivessel disease (e.g., 1-2 additional focal stenoses, SYNTAX <=22)  PCI: M (5);  Indication 28  CABG: A (9);  Indication 28 LMCA bifurcation involvement, concurrent intermediate or high disease burden multivessel disease (e.g., 1-2 additional bifurcation stenoses, long stenoses, SYNTAX >=23)  PCI: R (3);  Indication 29  CABG: A (9);  Indication 29  Notes:  A indicates appropriate. M indicates may be appropriate. R indicates rarely appropriate. Number in parentheses is median score for that indication. Reclassify indicates number of functionally diseased vessels should be decreased given negative FFR/iFR. Re-evaluate the scenario interpreting any FFR/iFR negative vessel as being not significantly stenosed.  Disease means involved vessel provides flow to a sufficient amount of myocardium to be clinically important.  If FFR testing indicates a vessel is not significant, that vessel should not be considered diseased (and the patient should be reclassified with respect to extent of functionally significant disease).  Proximal LAD + proximal left dominant LCX is considered 3 vessel CAD  2 Vessel CAD with FFR/iFR abnormal in only 1 but not both is considered 1 vessel CAD  Disease complexity includes occlusion, bifurcation, trifurcation, ostial, >20 mm, tortuosity, calcification, thrombus  LMCA disease is >=50% by angiography, MLD <2.8 mm, MLA <6 mm2; MLA 6-7.5 mm2 requires further physiologic  See Table B for  risk stratification based on noninvasive testing  Journal of the [employer] of Cardiology Mar 2017, 23391; DOI:  10.1016/j.jacc.2017.02.001 [email or web address].full-text.pdf This App  2018 by the Society for Cardiovascular Angiography and Interventions   Adrian Prows

## 2018-03-17 LAB — LDL CHOLESTEROL, DIRECT: Direct LDL: 58 mg/dL (ref 0–99)

## 2018-03-17 MED FILL — Nitroglycerin IV Soln 100 MCG/ML in D5W: INTRA_ARTERIAL | Qty: 10 | Status: AC

## 2018-03-18 DIAGNOSIS — E782 Mixed hyperlipidemia: Secondary | ICD-10-CM | POA: Diagnosis not present

## 2018-03-19 ENCOUNTER — Encounter: Payer: Self-pay | Admitting: Physician Assistant

## 2018-03-19 ENCOUNTER — Ambulatory Visit: Payer: BLUE CROSS/BLUE SHIELD | Admitting: Physician Assistant

## 2018-03-19 VITALS — BP 117/80 | HR 96 | Resp 14 | Ht 75.0 in | Wt 254.6 lb

## 2018-03-19 DIAGNOSIS — Z8709 Personal history of other diseases of the respiratory system: Secondary | ICD-10-CM

## 2018-03-19 DIAGNOSIS — M8589 Other specified disorders of bone density and structure, multiple sites: Secondary | ICD-10-CM

## 2018-03-19 DIAGNOSIS — Z8781 Personal history of (healed) traumatic fracture: Secondary | ICD-10-CM

## 2018-03-19 DIAGNOSIS — M722 Plantar fascial fibromatosis: Secondary | ICD-10-CM

## 2018-03-19 DIAGNOSIS — M0579 Rheumatoid arthritis with rheumatoid factor of multiple sites without organ or systems involvement: Secondary | ICD-10-CM

## 2018-03-19 DIAGNOSIS — Z79899 Other long term (current) drug therapy: Secondary | ICD-10-CM

## 2018-03-19 DIAGNOSIS — F17200 Nicotine dependence, unspecified, uncomplicated: Secondary | ICD-10-CM

## 2018-03-19 DIAGNOSIS — M4316 Spondylolisthesis, lumbar region: Secondary | ICD-10-CM

## 2018-03-19 DIAGNOSIS — M170 Bilateral primary osteoarthritis of knee: Secondary | ICD-10-CM

## 2018-03-19 DIAGNOSIS — Z8614 Personal history of Methicillin resistant Staphylococcus aureus infection: Secondary | ICD-10-CM

## 2018-03-19 DIAGNOSIS — R918 Other nonspecific abnormal finding of lung field: Secondary | ICD-10-CM

## 2018-03-19 NOTE — Patient Instructions (Addendum)
Standing Labs We placed an order today for your standing lab work.    Please come back and get your standing labs in January and every 3 months   TB gold, CBC, and CMP   We have open lab Monday through Friday from 8:30-11:30 AM and 1:30-4:00 PM  at the office of Dr. [name].   You may experience shorter wait times on Monday and Friday afternoons. The office is located at [address], [place], [place], [place] [address] No appointment is necessary.   Labs are drawn by [facility].  You may receive a bill from [facility] for your lab work. If you have any questions regarding directions or hours of operation,  please call [phone].   Just as a reminder please drink plenty of water prior to coming for your lab work. Thanks!   Please have cardiologist send lipid panel to our office    Semimembranosus Tendinitis Rehab Ask your health care provider which exercises are safe for you. Do exercises exactly as told by your health care provider and adjust them as directed. It is normal to feel mild stretching, pulling, tightness, or discomfort as you do these exercises, but you should stop right away if you feel sudden pain or your pain gets worse.Do not begin these exercises until told by your health care provider. Stretching and range of motion exercises These exercises warm up your muscles and joints and improve the movement and flexibility of your thigh. These exercises also help to relieve pain, numbness, and tingling. Exercise A: Hamstring stretch, supine  1. Lie on your back. Loop a belt or towel across the ball of your left / right foot The ball of your foot is on the walking surface, right under your toes. 2. Straighten your left / right knee and slowly pull on the belt to raise your leg. Stop when you feel a gentle stretch behind your left / right knee or thigh. ? Do not allow the knee to bend. ? Keep your other leg flat on the floor. 3. Hold this position for __________  seconds. Repeat __________ times. Complete this exercise __________ times a day. Strengthening exercises These exercises build strength and endurance in your thigh. Endurance is the ability to use your muscles for a long time, even after they get tired. Exercise B: Straight leg raises ( hip extensors) 1. Lie on your belly on a bed or a firm surface with a pillow under your hips. 2. Bend your left / right knee so your foot is straight up in the air. 3. Squeeze your buttock muscles and lift your left / right thigh off the bed. Do not let your back arch. 4. Hold this position for __________seconds. 5. Slowly return to the starting position. Let your muscles relax completely before you do another repetition. Repeat __________ times. Complete this exercise __________ times a day. Exercise C: Bridge ( hip extensors) 1. Lie on your back on a firm surface with your knees bent and your feet flat on the floor. 2. Tighten your buttocks muscles and lift your bottom off the floor until your trunk is level with your thighs. ? You should feel the muscles working in your buttocks and the back of your thighs. If you do not feel these muscles, slide your feet 1-2 inches (2.5-5 cm) farther away from your buttocks. ? Do not arch your back. 3. Hold this position for __________ seconds. 4. Slowly lower your hips to the starting position. 5. Let your buttocks muscles relax  completely between repetitions. If this exercise is too easy, try doing it with your arms crossed over your chest. Repeat __________ times. Complete this exercise __________ times a day. Exercise D: Hamstring eccentric, prone 1. Lie on your belly on a bed or on the floor. 2. Start with your legs straight. Cross your legs at the ankles with your left / right leg on top. 3. Using your bottom leg to do the work, bend both knees. 4. Using just your left / right leg alone, slowly lower your leg back down toward the bed. Add a __________ weight as  told by your health care provider. 5. Let your muscles relax completely between repetitions. Repeat __________ times. Complete this exercise __________ times a day. Exercise E: Squats 1. Stand in front of a table, with your feet and knees pointing straight ahead. You may rest your hands on the table for balance but not for support. 2. Slowly bend your knees and lower your hips like you are going to sit in a chair. Keep your thighs straight or pointed slightly outward. ? Keep your weight over your heels, not over your toes. ? Keep your lower legs upright so they are parallel with the table legs. ? Do not let your hips go lower than your knees. Stop when your knees are bent to the shape of an upside-down letter "L" (90 degree angle). ? Do not bend lower than told by your health care provider. ? If your knee pain increases, do not bend as low. 3. Hold the squat position __________ seconds. 4. Slowly push with your legs to return to standing. Do not use your hands to pull yourself to standing. Repeat __________ times. Complete this exercise __________ times a day. This information is not intended to replace advice given to you by your health care provider. Make sure you discuss any questions you have with your health care provider. Document Released: 03/24/2005 Document Revised: 11/29/2015 Document Reviewed: 12/26/2014 Elsevier Interactive Patient Education  [employer].

## 2018-03-24 ENCOUNTER — Encounter: Payer: Self-pay | Admitting: Internal Medicine

## 2018-03-24 ENCOUNTER — Ambulatory Visit: Payer: BLUE CROSS/BLUE SHIELD | Admitting: Internal Medicine

## 2018-03-24 ENCOUNTER — Encounter: Payer: Self-pay | Admitting: *Deleted

## 2018-03-24 VITALS — BP 120/64 | HR 76 | Ht 75.0 in | Wt 253.0 lb

## 2018-03-24 DIAGNOSIS — I209 Angina pectoris, unspecified: Secondary | ICD-10-CM | POA: Diagnosis not present

## 2018-03-24 DIAGNOSIS — I472 Ventricular tachycardia: Secondary | ICD-10-CM | POA: Diagnosis not present

## 2018-03-24 DIAGNOSIS — I471 Supraventricular tachycardia: Secondary | ICD-10-CM | POA: Diagnosis not present

## 2018-03-24 DIAGNOSIS — R002 Palpitations: Secondary | ICD-10-CM | POA: Diagnosis not present

## 2018-03-24 NOTE — H&P (View-Only) (Signed)
HPI Jeff Russell is referred by [name] for evaluation of SVT. He is a pleasant 52 yo man with a h/o tachypalpitations for 2 years. These start and stop suddenly. They last about 10 minutes to over an hour. He has had documented SVT at rates of over 200/min. He gets chest pressure and sob. He had a heart cath which demonstrated no obstructive CAD. He notes that despite medical therapy, his symptoms continue to worsen.  Allergies  Allergen Reactions  . Doxycycline Other (See Comments)    "burns his face" (Sun Sensitivity?)  . Humira [Adalimumab] Swelling and Rash     Current Outpatient Medications  Medication Sig Dispense Refill  . acetaminophen (TYLENOL ARTHRITIS PAIN) 650 MG CR tablet Take 1,300 mg by mouth 2 (two) times daily.     . ACTEMRA 162 MG/0.9ML SOSY INJECT 162MG  (1 SYRINGE) UNDER THE SKIN EVERY WEEK 12 Syringe 0  . Cholecalciferol (VITAMIN D3) 1000 units CAPS Take 1,000 Units by mouth daily.    . cyclobenzaprine (FLEXERIL) 10 MG tablet Take 1 tablet (10 mg total) by mouth 3 (three) times daily as needed for muscle spasms. (Patient taking differently: Take 10 mg by mouth at bedtime. ) 90 tablet 0  . esomeprazole (NEXIUM) 20 MG capsule Take 20 mg by mouth daily before breakfast.    . fluticasone (FLONASE) 50 MCG/ACT nasal spray Place 2 sprays into both nostrils daily as needed for allergies or rhinitis.    Marland Kitchen gabapentin (NEURONTIN) 300 MG capsule TAKE 1 CAPSULE BY MOUTH TWICE DAILY 270 capsule 0  . methocarbamol (ROBAXIN) 500 MG tablet TAKE 1 TABLET BY MOUTH THREE TIMES DAILYDO NOT TAKE WITH CYCLOBENZAPRINE (Patient taking differently: Take 500 mg by mouth every morning. ) 90 tablet 0  . metoprolol succinate (TOPROL-XL) 25 MG 24 hr tablet Take 25 mg by mouth daily.    . rosuvastatin (CRESTOR) 20 MG tablet Take 20 mg by mouth daily.    Marland Kitchen sulfaSALAzine (AZULFIDINE) 500 MG EC tablet TAKE 2 TABLETS BY MOUTH TWICE DAILY 360 tablet 0  . terbinafine (LAMISIL) 250 MG tablet Take 250  mg by mouth daily.    Marland Kitchen diltiazem (CARDIZEM) 30 MG tablet Take 30 mg by mouth daily as needed (Rapid heart rate).   0   No current facility-administered medications for this visit.      Past Medical History:  Diagnosis Date  . Allergy   . Arthritis   . COPD (chronic obstructive pulmonary disease) ([place])    per pt  . GERD (gastroesophageal reflux disease)   . History of MRSA infection    right leg per patient  . MSSA (methicillin susceptible Staphylococcus aureus) infection 05/01/2011  . Pneumonia   . Rheumatoid nodules ([place]) 08/06/2014   right lung-per CT scan ordered by Dr Estanislado Pandy  . Septic bursitis 05/01/2011  . Shortness of breath    occ    ROS:   All systems reviewed and negative except as noted in the HPI.   Past Surgical History:  Procedure Laterality Date  . BACK SURGERY    . carpel tunnel Bilateral   . CHOLECYSTECTOMY    . COLONOSCOPY    . EPIDURAL BLOCK INJECTION    . EYE SURGERY Left    wire in eye  . I&D EXTREMITY  03/24/2011   Procedure: IRRIGATION AND DEBRIDEMENT EXTREMITY;  Surgeon: Yvette Rack., MD;  Location: [facility];  Service: Orthopedics;  Laterality: Right;  Irrigation and debridement right knee abcess and Bursa,  application of wound vac  . I&D EXTREMITY  03/28/2011   Procedure: IRRIGATION AND DEBRIDEMENT EXTREMITY;  Surgeon: Yvette Rack., MD;  Location: [facility];  Service: Orthopedics;  Laterality: Right;  I&D right knee with wound closure  . LEFT HEART CATH AND CORONARY ANGIOGRAPHY N/A 03/16/2018   Procedure: LEFT HEART CATH AND CORONARY ANGIOGRAPHY;  Surgeon: Adrian Prows, MD;  Location: [facility];  Service: Cardiovascular;  Laterality: N/A;  . MENISCUS REPAIR Right 2017     Family History  Problem Relation Age of Onset  . Arthritis Maternal Grandmother   . Angina Maternal Grandmother   . Colon cancer Paternal Grandfather        deceased  . Prostate cancer Father   . Hypercholesterolemia Mother   . Heart disease Mother         palpitations, ?heart failure  . Hypertension Mother   . Other Mother        Heart enlargement,left ventricle problems  . Congestive Heart Failure Paternal Grandmother   . Esophageal cancer Neg Hx   . Rectal cancer Neg Hx   . Stomach cancer Neg Hx      Social History   Socioeconomic History  . Marital status: Married    Spouse name: Not on file  . Number of children: Not on file  . Years of education: Not on file  . Highest education level: Not on file  Occupational History  . Not on file  Social Needs  . Financial resource strain: Not on file  . Food insecurity:    Worry: Not on file    Inability: Not on file  . Transportation needs:    Medical: Not on file    Non-medical: Not on file  Tobacco Use  . Smoking status: Current Every Day Smoker    Packs/day: 2.00    Years: 30.00    Pack years: 60.00    Types: Cigarettes  . Smokeless tobacco: Never Used  Substance and Sexual Activity  . Alcohol use: No    Comment: quit 20 yrs ago  . Drug use: No  . Sexual activity: Yes  Lifestyle  . Physical activity:    Days per week: Not on file    Minutes per session: Not on file  . Stress: Not on file  Relationships  . Social connections:    Talks on phone: Not on file    Gets together: Not on file    Attends religious service: Not on file    Active member of club or organization: Not on file    Attends meetings of clubs or organizations: Not on file    Relationship status: Not on file  . Intimate partner violence:    Fear of current or ex partner: Not on file    Emotionally abused: Not on file    Physically abused: Not on file    Forced sexual activity: Not on file  Other Topics Concern  . Not on file  Social History Narrative   Work or School: lawn care      Home Situation: lives with wife whom sees me as well       Spiritual Beliefs: believes in God; does not go to church      Lifestyle: no CV exercise - but physically active at work; diet is poor        BP  120/64   Pulse 76   Ht 6\' 3"  (1.905 m)   Wt 253 lb (114.8 kg)  SpO2 97%   BMI 31.62 kg/m   Physical Exam:  Well appearing NAD HEENT: Unremarkable Neck:  No JVD, no thyromegally Lymphatics:  No adenopathy Back:  No CVA tenderness Lungs:  Clear with no wheezes HEART:  Regular rate rhythm, no murmurs, no rubs, no clicks Abd:  soft, positive bowel sounds, no organomegally, no rebound, no guarding Ext:  2 plus pulses, no edema, no cyanosis, no clubbing Skin:  No rashes no nodules Neuro:  CN II through XII intact, motor grossly intact  EKG - nsr with no pre-excitation  Assess/Plan: 1. SVT - I have discussed the treatment options with the patient and his wife and the risks/benefits/goals/expectations of EP study and catheter ablation were reviewed and he wishes to proceed.   Mikle Bosworth.D.

## 2018-03-24 NOTE — Patient Instructions (Addendum)
Medication Instructions:  Your physician recommends that you continue on your current medications as directed. Please refer to the Current Medication list given to you today.  Labwork: You will get lab work today:  BMP and CBC.  Testing/Procedures: Your physician has recommended that you have an ablation. Catheter ablation is a medical procedure used to treat some cardiac arrhythmias (irregular heartbeats). During catheter ablation, a long, thin, flexible tube is put into a blood vessel in your groin (upper thigh), or neck. This tube is called an ablation catheter. It is then guided to your heart through the blood vessel. Radio frequency waves destroy small areas of heart tissue where abnormal heartbeats may cause an arrhythmia to start. Please see the instruction sheet given to you today.  Follow-Up:  You will follow up with Dr. Lovena Le 4 weeks after your procedure.  ABLATION INSTRUCTIONS:  Please arrive to ADMITTING down the hall from the [facility] main entrance of [facility] at:  11:30 am on April 06, 2018  Do not eat or drink the morning of your procedure  Do not take Toprol XL or diltiazem for 2 days prior to procedure.  Your last dose will April 03, 2018  On the morning of your procedure you may take the rest of your normal morning medications with a sip of water  Plan for one night stay  You will need someone to drive you home at discharge  If you need a refill on your cardiac medications before your next appointment, please call your pharmacy.   Cardiac Ablation Cardiac ablation is a procedure to disable (ablate) a small amount of heart tissue in very specific places. The heart has many electrical connections. Sometimes these connections are abnormal and can cause the heart to beat very fast or irregularly. Ablating some of the problem areas can improve the heart rhythm or return it to normal. Ablation may be done for people who:  Have Wolff-Parkinson-White  syndrome.  Have fast heart rhythms (tachycardia).  Have taken medicines for an abnormal heart rhythm (arrhythmia) that were not effective or caused side effects.  Have a high-risk heartbeat that may be life-threatening. During the procedure, a small incision is made in the neck or the groin, and a long, thin, flexible tube (catheter) is inserted into the incision and moved to the heart. Small devices (electrodes) on the tip of the catheter will send out electrical currents. A type of X-ray (fluoroscopy) will be used to help guide the catheter and to provide images of the heart. Tell a health care provider about:  Any allergies you have.  All medicines you are taking, including vitamins, herbs, eye drops, creams, and over-the-counter medicines.  Any problems you or family members have had with anesthetic medicines.  Any blood disorders you have.  Any surgeries you have had.  Any medical conditions you have, such as kidney failure.  Whether you are pregnant or may be pregnant. What are the risks? Generally, this is a safe procedure. However, problems may occur, including:  Infection.  Bruising and bleeding at the catheter insertion site.  Bleeding into the chest, especially into the sac that surrounds the heart. This is a serious complication.  Stroke or blood clots.  Damage to other structures or organs.  Allergic reaction to medicines or dyes.  Need for a permanent pacemaker if the normal electrical system is damaged. A pacemaker is a small computer that sends electrical signals to the heart and helps your heart beat normally.  The procedure  not being fully effective. This may not be recognized until months later. Repeat ablation procedures are sometimes required. What happens before the procedure?  Follow instructions from your health care provider about eating or drinking restrictions.  Ask your health care provider about: ? Changing or stopping your regular medicines.  This is especially important if you are taking diabetes medicines or blood thinners. ? Taking medicines such as aspirin and ibuprofen. These medicines can thin your blood. Do not take these medicines before your procedure if your health care provider instructs you not to.  Plan to have someone take you home from the hospital or clinic.  If you will be going home right after the procedure, plan to have someone with you for 24 hours. What happens during the procedure?  To lower your risk of infection: ? Your health care team will wash or sanitize their hands. ? Your skin will be washed with soap. ? Hair may be removed from the incision area.  An IV tube will be inserted into one of your veins.  You will be given a medicine to help you relax (sedative).  The skin on your neck or groin will be numbed.  An incision will be made in your neck or your groin.  A needle will be inserted through the incision and into a large vein in your neck or groin.  A catheter will be inserted into the needle and moved to your heart.  Dye may be injected through the catheter to help your surgeon see the area of the heart that needs treatment.  Electrical currents will be sent from the catheter to ablate heart tissue in desired areas. There are three types of energy that may be used to ablate heart tissue: ? Heat (radiofrequency energy). ? Laser energy. ? Extreme cold (cryoablation).  When the necessary tissue has been ablated, the catheter will be removed.  Pressure will be held on the catheter insertion area to prevent excessive bleeding.  A bandage (dressing) will be placed over the catheter insertion area. The procedure may vary among health care providers and hospitals. What happens after the procedure?  Your blood pressure, heart rate, breathing rate, and blood oxygen level will be monitored until the medicines you were given have worn off.  Your catheter insertion area will be monitored for  bleeding. You will need to lie [place] for a few hours to ensure that you do not bleed from the catheter insertion area.  Do not drive for 24 hours or as long as directed by your health care provider. Summary  Cardiac ablation is a procedure to disable (ablate) a small amount of heart tissue in very specific places. Ablating some of the problem areas can improve the heart rhythm or return it to normal.  During the procedure, electrical currents will be sent from the catheter to ablate heart tissue in desired areas. This information is not intended to replace advice given to you by your health care provider. Make sure you discuss any questions you have with your health care provider. Document Released: 08/10/2008 Document Revised: 02/11/2016 Document Reviewed: 02/11/2016 Elsevier Interactive Patient Education  2019 [employer].

## 2018-03-24 NOTE — Progress Notes (Signed)
HPI Mr. Lariccia is referred by [name] for evaluation of SVT. He is a pleasant 52 yo man with a h/o tachypalpitations for 2 years. These start and stop suddenly. They last about 10 minutes to over an hour. He has had documented SVT at rates of over 200/min. He gets chest pressure and sob. He had a heart cath which demonstrated no obstructive CAD. He notes that despite medical therapy, his symptoms continue to worsen.  Allergies  Allergen Reactions  . Doxycycline Other (See Comments)    "burns his face" (Sun Sensitivity?)  . Humira [Adalimumab] Swelling and Rash     Current Outpatient Medications  Medication Sig Dispense Refill  . acetaminophen (TYLENOL ARTHRITIS PAIN) 650 MG CR tablet Take 1,300 mg by mouth 2 (two) times daily.     . ACTEMRA 162 MG/0.9ML SOSY INJECT 162MG  (1 SYRINGE) UNDER THE SKIN EVERY WEEK 12 Syringe 0  . Cholecalciferol (VITAMIN D3) 1000 units CAPS Take 1,000 Units by mouth daily.    . cyclobenzaprine (FLEXERIL) 10 MG tablet Take 1 tablet (10 mg total) by mouth 3 (three) times daily as needed for muscle spasms. (Patient taking differently: Take 10 mg by mouth at bedtime. ) 90 tablet 0  . esomeprazole (NEXIUM) 20 MG capsule Take 20 mg by mouth daily before breakfast.    . fluticasone (FLONASE) 50 MCG/ACT nasal spray Place 2 sprays into both nostrils daily as needed for allergies or rhinitis.    Marland Kitchen gabapentin (NEURONTIN) 300 MG capsule TAKE 1 CAPSULE BY MOUTH TWICE DAILY 270 capsule 0  . methocarbamol (ROBAXIN) 500 MG tablet TAKE 1 TABLET BY MOUTH THREE TIMES DAILYDO NOT TAKE WITH CYCLOBENZAPRINE (Patient taking differently: Take 500 mg by mouth every morning. ) 90 tablet 0  . metoprolol succinate (TOPROL-XL) 25 MG 24 hr tablet Take 25 mg by mouth daily.    . rosuvastatin (CRESTOR) 20 MG tablet Take 20 mg by mouth daily.    Marland Kitchen sulfaSALAzine (AZULFIDINE) 500 MG EC tablet TAKE 2 TABLETS BY MOUTH TWICE DAILY 360 tablet 0  . terbinafine (LAMISIL) 250 MG tablet Take 250  mg by mouth daily.    Marland Kitchen diltiazem (CARDIZEM) 30 MG tablet Take 30 mg by mouth daily as needed (Rapid heart rate).   0   No current facility-administered medications for this visit.      Past Medical History:  Diagnosis Date  . Allergy   . Arthritis   . COPD (chronic obstructive pulmonary disease) ([place])    per pt  . GERD (gastroesophageal reflux disease)   . History of MRSA infection    right leg per patient  . MSSA (methicillin susceptible Staphylococcus aureus) infection 05/01/2011  . Pneumonia   . Rheumatoid nodules ([place]) 08/06/2014   right lung-per CT scan ordered by Dr Estanislado Pandy  . Septic bursitis 05/01/2011  . Shortness of breath    occ    ROS:   All systems reviewed and negative except as noted in the HPI.   Past Surgical History:  Procedure Laterality Date  . BACK SURGERY    . carpel tunnel Bilateral   . CHOLECYSTECTOMY    . COLONOSCOPY    . EPIDURAL BLOCK INJECTION    . EYE SURGERY Left    wire in eye  . I&D EXTREMITY  03/24/2011   Procedure: IRRIGATION AND DEBRIDEMENT EXTREMITY;  Surgeon: Yvette Rack., MD;  Location: [facility];  Service: Orthopedics;  Laterality: Right;  Irrigation and debridement right knee abcess and Bursa,  application of wound vac  . I&D EXTREMITY  03/28/2011   Procedure: IRRIGATION AND DEBRIDEMENT EXTREMITY;  Surgeon: Yvette Rack., MD;  Location: [facility];  Service: Orthopedics;  Laterality: Right;  I&D right knee with wound closure  . LEFT HEART CATH AND CORONARY ANGIOGRAPHY N/A 03/16/2018   Procedure: LEFT HEART CATH AND CORONARY ANGIOGRAPHY;  Surgeon: Adrian Prows, MD;  Location: [facility];  Service: Cardiovascular;  Laterality: N/A;  . MENISCUS REPAIR Right 2017     Family History  Problem Relation Age of Onset  . Arthritis Maternal Grandmother   . Angina Maternal Grandmother   . Colon cancer Paternal Grandfather        deceased  . Prostate cancer Father   . Hypercholesterolemia Mother   . Heart disease Mother         palpitations, ?heart failure  . Hypertension Mother   . Other Mother        Heart enlargement,left ventricle problems  . Congestive Heart Failure Paternal Grandmother   . Esophageal cancer Neg Hx   . Rectal cancer Neg Hx   . Stomach cancer Neg Hx      Social History   Socioeconomic History  . Marital status: Married    Spouse name: Not on file  . Number of children: Not on file  . Years of education: Not on file  . Highest education level: Not on file  Occupational History  . Not on file  Social Needs  . Financial resource strain: Not on file  . Food insecurity:    Worry: Not on file    Inability: Not on file  . Transportation needs:    Medical: Not on file    Non-medical: Not on file  Tobacco Use  . Smoking status: Current Every Day Smoker    Packs/day: 2.00    Years: 30.00    Pack years: 60.00    Types: Cigarettes  . Smokeless tobacco: Never Used  Substance and Sexual Activity  . Alcohol use: No    Comment: quit 20 yrs ago  . Drug use: No  . Sexual activity: Yes  Lifestyle  . Physical activity:    Days per week: Not on file    Minutes per session: Not on file  . Stress: Not on file  Relationships  . Social connections:    Talks on phone: Not on file    Gets together: Not on file    Attends religious service: Not on file    Active member of club or organization: Not on file    Attends meetings of clubs or organizations: Not on file    Relationship status: Not on file  . Intimate partner violence:    Fear of current or ex partner: Not on file    Emotionally abused: Not on file    Physically abused: Not on file    Forced sexual activity: Not on file  Other Topics Concern  . Not on file  Social History Narrative   Work or School: lawn care      Home Situation: lives with wife whom sees me as well       Spiritual Beliefs: believes in God; does not go to church      Lifestyle: no CV exercise - but physically active at work; diet is poor        BP  120/64   Pulse 76   Ht 6\' 3"  (1.905 m)   Wt 253 lb (114.8 kg)  SpO2 97%   BMI 31.62 kg/m   Physical Exam:  Well appearing NAD HEENT: Unremarkable Neck:  No JVD, no thyromegally Lymphatics:  No adenopathy Back:  No CVA tenderness Lungs:  Clear with no wheezes HEART:  Regular rate rhythm, no murmurs, no rubs, no clicks Abd:  soft, positive bowel sounds, no organomegally, no rebound, no guarding Ext:  2 plus pulses, no edema, no cyanosis, no clubbing Skin:  No rashes no nodules Neuro:  CN II through XII intact, motor grossly intact  EKG - nsr with no pre-excitation  Assess/Plan: 1. SVT - I have discussed the treatment options with the patient and his wife and the risks/benefits/goals/expectations of EP study and catheter ablation were reviewed and he wishes to proceed.   Mikle Bosworth.D.

## 2018-03-25 LAB — CBC WITH DIFFERENTIAL/PLATELET
Basophils Absolute: 0.1 10*3/uL (ref 0.0–0.2)
Basos: 1 %
EOS (ABSOLUTE): 0.3 10*3/uL (ref 0.0–0.4)
Eos: 3 %
Hematocrit: 44 % (ref 37.5–51.0)
Hemoglobin: 15.2 g/dL (ref 13.0–17.7)
IMMATURE GRANS (ABS): 0.1 10*3/uL (ref 0.0–0.1)
Immature Granulocytes: 1 %
Lymphocytes Absolute: 3.4 10*3/uL — ABNORMAL HIGH (ref 0.7–3.1)
Lymphs: 40 %
MCH: 34.5 pg — ABNORMAL HIGH (ref 26.6–33.0)
MCHC: 34.5 g/dL (ref 31.5–35.7)
MCV: 100 fL — ABNORMAL HIGH (ref 79–97)
Monocytes Absolute: 0.9 10*3/uL (ref 0.1–0.9)
Monocytes: 11 %
Neutrophils Absolute: 3.8 10*3/uL (ref 1.4–7.0)
Neutrophils: 44 %
Platelets: 212 10*3/uL (ref 150–450)
RBC: 4.4 x10E6/uL (ref 4.14–5.80)
RDW: 11.8 % — ABNORMAL LOW (ref 12.3–15.4)
WBC: 8.5 10*3/uL (ref 3.4–10.8)

## 2018-03-25 LAB — BASIC METABOLIC PANEL
BUN/Creatinine Ratio: 11 (ref 9–20)
BUN: 11 mg/dL (ref 6–24)
CALCIUM: 10.1 mg/dL (ref 8.7–10.2)
CHLORIDE: 102 mmol/L (ref 96–106)
CO2: 27 mmol/L (ref 20–29)
Creatinine, Ser: 1 mg/dL (ref 0.76–1.27)
GFR calc Af Amer: 100 mL/min/{1.73_m2} (ref >59)
GFR calc non Af Amer: 86 mL/min/{1.73_m2} (ref >59)
Glucose: 80 mg/dL (ref 65–99)
Potassium: 5.5 mmol/L — ABNORMAL HIGH (ref 3.5–5.2)
Sodium: 142 mmol/L (ref 134–144)

## 2018-03-27 ENCOUNTER — Other Ambulatory Visit: Payer: Self-pay | Admitting: Rheumatology

## 2018-03-29 NOTE — Telephone Encounter (Signed)
Last Visit: 03/19/18 Next Visit: 08/19/18  Okay to refill per [name]

## 2018-04-06 ENCOUNTER — Other Ambulatory Visit: Payer: Self-pay

## 2018-04-06 ENCOUNTER — Ambulatory Visit (HOSPITAL_COMMUNITY)
Admission: RE | Disposition: A | Discharge status: Home / Self Care | Payer: Self-pay | Source: Home / Self Care | Attending: Internal Medicine

## 2018-04-06 ENCOUNTER — Ambulatory Visit (HOSPITAL_COMMUNITY)
Admission: RE | Admit: 2018-04-06 | Discharge: 2018-04-07 | Disposition: A | Discharge status: Home / Self Care | Payer: BLUE CROSS/BLUE SHIELD | Attending: Internal Medicine | Admitting: Internal Medicine

## 2018-04-06 ENCOUNTER — Encounter (HOSPITAL_COMMUNITY): Payer: Self-pay | Admitting: *Deleted

## 2018-04-06 DIAGNOSIS — Z9889 Other specified postprocedural states: Secondary | ICD-10-CM

## 2018-04-06 DIAGNOSIS — K219 Gastro-esophageal reflux disease without esophagitis: Secondary | ICD-10-CM | POA: Diagnosis not present

## 2018-04-06 DIAGNOSIS — Z888 Allergy status to other drugs, medicaments and biological substances status: Secondary | ICD-10-CM | POA: Insufficient documentation

## 2018-04-06 DIAGNOSIS — Z8249 Family history of ischemic heart disease and other diseases of the circulatory system: Secondary | ICD-10-CM | POA: Insufficient documentation

## 2018-04-06 DIAGNOSIS — I471 Supraventricular tachycardia, unspecified: Secondary | ICD-10-CM | POA: Diagnosis not present

## 2018-04-06 DIAGNOSIS — Z881 Allergy status to other antibiotic agents status: Secondary | ICD-10-CM | POA: Diagnosis not present

## 2018-04-06 DIAGNOSIS — Z23 Encounter for immunization: Secondary | ICD-10-CM | POA: Diagnosis not present

## 2018-04-06 DIAGNOSIS — M1990 Unspecified osteoarthritis, unspecified site: Secondary | ICD-10-CM | POA: Diagnosis not present

## 2018-04-06 DIAGNOSIS — Z8261 Family history of arthritis: Secondary | ICD-10-CM | POA: Diagnosis not present

## 2018-04-06 DIAGNOSIS — J449 Chronic obstructive pulmonary disease, unspecified: Secondary | ICD-10-CM | POA: Insufficient documentation

## 2018-04-06 DIAGNOSIS — Z955 Presence of coronary angioplasty implant and graft: Secondary | ICD-10-CM | POA: Insufficient documentation

## 2018-04-06 DIAGNOSIS — F17210 Nicotine dependence, cigarettes, uncomplicated: Secondary | ICD-10-CM | POA: Diagnosis present

## 2018-04-06 DIAGNOSIS — Z79899 Other long term (current) drug therapy: Secondary | ICD-10-CM | POA: Diagnosis not present

## 2018-04-06 HISTORY — DX: Other specified postprocedural states: Z98.890

## 2018-04-06 HISTORY — PX: SVT ABLATION: EP1225

## 2018-04-06 HISTORY — DX: Supraventricular tachycardia: I47.1

## 2018-04-06 HISTORY — DX: Supraventricular tachycardia, unspecified: I47.10

## 2018-04-06 LAB — POCT ACTIVATED CLOTTING TIME
Activated Clotting Time: 164 seconds
Activated Clotting Time: 186 seconds
Activated Clotting Time: 164 seconds

## 2018-04-06 SURGERY — SVT ABLATION

## 2018-04-06 MED ORDER — MIDAZOLAM HCL 5 MG/5ML IJ SOLN
INTRAMUSCULAR | Status: AC
  Filled 2018-04-06: qty 5

## 2018-04-06 MED ORDER — HEPARIN (PORCINE) IN NACL 1000-0.9 UT/500ML-% IV SOLN
INTRAVENOUS | Status: AC
  Filled 2018-04-06: qty 500

## 2018-04-06 MED ORDER — HEPARIN SODIUM (PORCINE) 1000 UNIT/ML IJ SOLN
INTRAMUSCULAR | Status: AC
  Filled 2018-04-06: qty 1

## 2018-04-06 MED ORDER — ISOPROTERENOL HCL 0.2 MG/ML IJ SOLN
INTRAMUSCULAR | Status: AC
  Filled 2018-04-06: qty 5

## 2018-04-06 MED ORDER — GABAPENTIN 300 MG PO CAPS
300.0000 mg | ORAL_CAPSULE | Freq: Three times a day (TID) | ORAL | Status: DC
  Administered 2018-04-06 – 2018-04-07 (×2): 300 mg via ORAL
  Filled 2018-04-06 (×2): qty 1

## 2018-04-06 MED ORDER — METHOCARBAMOL 500 MG PO TABS
500.0000 mg | ORAL_TABLET | Freq: Three times a day (TID) | ORAL | Status: DC
  Administered 2018-04-06 – 2018-04-07 (×2): 500 mg via ORAL
  Filled 2018-04-06 (×2): qty 1

## 2018-04-06 MED ORDER — SODIUM CHLORIDE 0.9% FLUSH: 3.0000 mL | INTRAVENOUS | Status: DC | PRN

## 2018-04-06 MED ORDER — ADENOSINE 6 MG/2ML IV SOLN
INTRAVENOUS | Status: DC | PRN
  Administered 2018-04-06: 12 mg via INTRAVENOUS

## 2018-04-06 MED ORDER — ROSUVASTATIN CALCIUM 20 MG PO TABS
20.0000 mg | ORAL_TABLET | Freq: Every day | ORAL | Status: DC
  Administered 2018-04-06: 20 mg via ORAL
  Filled 2018-04-06 (×2): qty 1

## 2018-04-06 MED ORDER — SODIUM CHLORIDE 0.9 % IV SOLN: 250.0000 mL | INTRAVENOUS | Status: DC | PRN

## 2018-04-06 MED ORDER — INFLUENZA VAC SPLIT QUAD 0.5 ML IM SUSY
0.5000 mL | PREFILLED_SYRINGE | INTRAMUSCULAR | Status: AC
  Administered 2018-04-07: 0.5 mL via INTRAMUSCULAR
  Filled 2018-04-06: qty 0.5

## 2018-04-06 MED ORDER — BUPIVACAINE HCL (PF) 0.25 % IJ SOLN
INTRAMUSCULAR | Status: DC | PRN
  Administered 2018-04-06: 60 mL

## 2018-04-06 MED ORDER — FENTANYL CITRATE (PF) 100 MCG/2ML IJ SOLN
INTRAMUSCULAR | Status: AC
  Filled 2018-04-06: qty 2

## 2018-04-06 MED ORDER — HEPARIN (PORCINE) IN NACL 2-0.9 UNITS/ML
INTRAMUSCULAR | Status: AC | PRN
  Administered 2018-04-06: 500 mL

## 2018-04-06 MED ORDER — ADENOSINE 6 MG/2ML IV SOLN
INTRAVENOUS | Status: AC
  Filled 2018-04-06: qty 4

## 2018-04-06 MED ORDER — MIDAZOLAM HCL 5 MG/5ML IJ SOLN
INTRAMUSCULAR | Status: DC | PRN
  Administered 2018-04-06: 1 mg via INTRAVENOUS
  Administered 2018-04-06 (×5): 2 mg via INTRAVENOUS

## 2018-04-06 MED ORDER — ACETAMINOPHEN 500 MG PO TABS
1000.0000 mg | ORAL_TABLET | Freq: Two times a day (BID) | ORAL | Status: DC
  Administered 2018-04-06 – 2018-04-07 (×2): 1000 mg via ORAL
  Filled 2018-04-06 (×2): qty 2

## 2018-04-06 MED ORDER — HEPARIN SODIUM (PORCINE) 1000 UNIT/ML IJ SOLN
INTRAMUSCULAR | Status: DC | PRN
  Administered 2018-04-06: 3000 [IU] via INTRAVENOUS
  Administered 2018-04-06: 6000 [IU] via INTRAVENOUS

## 2018-04-06 MED ORDER — METOPROLOL SUCCINATE ER 25 MG PO TB24
25.0000 mg | ORAL_TABLET | Freq: Every day | ORAL | Status: DC
  Administered 2018-04-06 – 2018-04-07 (×2): 25 mg via ORAL
  Filled 2018-04-06 (×2): qty 1

## 2018-04-06 MED ORDER — ONDANSETRON HCL 4 MG/2ML IJ SOLN: 4.0000 mg | Freq: Four times a day (QID) | INTRAMUSCULAR | Status: DC | PRN

## 2018-04-06 MED ORDER — ACETAMINOPHEN 325 MG PO TABS: 650.0000 mg | ORAL_TABLET | ORAL | Status: DC | PRN

## 2018-04-06 MED ORDER — SODIUM CHLORIDE 0.9 % IV SOLN
INTRAVENOUS | Status: DC | PRN
  Administered 2018-04-06: 4 ug/min via INTRAVENOUS

## 2018-04-06 MED ORDER — BUPIVACAINE HCL (PF) 0.25 % IJ SOLN
INTRAMUSCULAR | Status: AC
  Filled 2018-04-06: qty 30

## 2018-04-06 MED ORDER — SULFASALAZINE 500 MG PO TBEC
1000.0000 mg | DELAYED_RELEASE_TABLET | Freq: Two times a day (BID) | ORAL | Status: DC
  Administered 2018-04-06 – 2018-04-07 (×2): 1000 mg via ORAL
  Filled 2018-04-06 (×3): qty 2

## 2018-04-06 MED ORDER — FENTANYL CITRATE (PF) 100 MCG/2ML IJ SOLN
INTRAMUSCULAR | Status: DC | PRN
  Administered 2018-04-06 (×3): 25 ug via INTRAVENOUS
  Administered 2018-04-06: 12.5 ug via INTRAVENOUS
  Administered 2018-04-06 (×2): 25 ug via INTRAVENOUS

## 2018-04-06 MED ORDER — CYCLOBENZAPRINE HCL 10 MG PO TABS: 10.0000 mg | ORAL_TABLET | Freq: Three times a day (TID) | ORAL | Status: DC | PRN

## 2018-04-06 MED ORDER — SODIUM CHLORIDE 0.9 % IV SOLN
INTRAVENOUS | Status: DC
  Administered 2018-04-06: 12:00:00 via INTRAVENOUS

## 2018-04-06 MED ORDER — SODIUM CHLORIDE 0.9% FLUSH
3.0000 mL | Freq: Two times a day (BID) | INTRAVENOUS | Status: DC
  Administered 2018-04-06 – 2018-04-07 (×2): 3 mL via INTRAVENOUS

## 2018-04-06 MED ORDER — TERBINAFINE HCL 250 MG PO TABS
250.0000 mg | ORAL_TABLET | Freq: Every day | ORAL | Status: DC
  Administered 2018-04-07: 250 mg via ORAL
  Filled 2018-04-06 (×4): qty 1

## 2018-04-06 SURGICAL SUPPLY — 11 items
BAG SNAP BAND KOVER 36X36 (MISCELLANEOUS) ×2 IMPLANT
CATH CELSIUS THERMO F CV 7FR (ABLATOR) ×2 IMPLANT
CATH JOSEPH QUAD ALLRED 6F REP (CATHETERS) ×4 IMPLANT
CATH POLARIS X 2.5/5/2.5 DECAP (CATHETERS) ×2 IMPLANT
PACK EP LATEX FREE (CUSTOM PROCEDURE TRAY) ×1
PACK EP LF (CUSTOM PROCEDURE TRAY) ×1 IMPLANT
PAD PRO RADIOLUCENT 2001M-C (PAD) ×2 IMPLANT
SHEATH PINNACLE 6F 10CM (SHEATH) ×4 IMPLANT
SHEATH PINNACLE 7F 10CM (SHEATH) ×4 IMPLANT
SHEATH PINNACLE 8F 10CM (SHEATH) ×4 IMPLANT
SHIELD RADPAD SCOOP 12X17 (MISCELLANEOUS) ×2 IMPLANT

## 2018-04-06 NOTE — Interval H&P Note (Signed)
History and Physical Interval Note:  04/06/2018 1:09 PM  Jeff Russell  has presented today for surgery, with the diagnosis of svt  The various methods of treatment have been discussed with the patient and family. After consideration of risks, benefits and other options for treatment, the patient has consented to  Procedure(s): SVT ABLATION (N/A) as a surgical intervention .  The patient's history has been reviewed, patient examined, no change in status, stable for surgery.  I have reviewed the patient's chart and labs.  Questions were answered to the patient's satisfaction.     Cristopher Peru

## 2018-04-06 NOTE — Discharge Instructions (Signed)
Post procedure care instructions No driving for 4 days. No lifting over 5 lbs for 1 week. No vigorous or sexual activity for 1 week. You may return to work on 04/13/18. Keep procedure site clean & dry. If you notice increased pain, swelling, bleeding or pus, call/return!  You may shower, but no soaking baths/hot tubs/pools for 1 week.

## 2018-04-06 NOTE — Progress Notes (Signed)
Site area: rt IJ venous sheath Site Prior to Removal:  Level 0 Pressure Applied For: 10 minutes Manual:   yes Patient Status During Pull:  stable Post Pull Site:  Level 0 Post Pull Instructions Given:  yes Post Pull Pulses Present: NA Dressing Applied:  Gauze and tegaderm Bedrest begins @  Comments:

## 2018-04-06 NOTE — Progress Notes (Signed)
Site area: rt fa sheath pulled at 1635 and rt fv sheaths x3 pulled at 1640 Site Prior to Removal:  Level 0 Pressure Applied For: 45 minutes Manual:   yes Patient Status During Pull:  stable Post Pull Site:  Level 0, faint bruising at puncture sites Post Pull Instructions Given:  yes Post Pull Pulses Present: rt dp palpable Dressing Applied:  Gauze and tegaderm Bedrest begins @ 1720 Comments:  IV saline locked

## 2018-04-07 ENCOUNTER — Encounter (HOSPITAL_COMMUNITY): Payer: Self-pay | Admitting: Nurse Practitioner

## 2018-04-07 DIAGNOSIS — I471 Supraventricular tachycardia: Secondary | ICD-10-CM

## 2018-04-07 DIAGNOSIS — J449 Chronic obstructive pulmonary disease, unspecified: Secondary | ICD-10-CM | POA: Diagnosis not present

## 2018-04-07 DIAGNOSIS — Z881 Allergy status to other antibiotic agents status: Secondary | ICD-10-CM | POA: Diagnosis not present

## 2018-04-07 DIAGNOSIS — K219 Gastro-esophageal reflux disease without esophagitis: Secondary | ICD-10-CM | POA: Diagnosis not present

## 2018-04-07 DIAGNOSIS — Z79899 Other long term (current) drug therapy: Secondary | ICD-10-CM | POA: Diagnosis not present

## 2018-04-07 DIAGNOSIS — Z888 Allergy status to other drugs, medicaments and biological substances status: Secondary | ICD-10-CM | POA: Diagnosis not present

## 2018-04-07 DIAGNOSIS — Z8261 Family history of arthritis: Secondary | ICD-10-CM | POA: Diagnosis not present

## 2018-04-07 DIAGNOSIS — Z8249 Family history of ischemic heart disease and other diseases of the circulatory system: Secondary | ICD-10-CM | POA: Diagnosis not present

## 2018-04-07 DIAGNOSIS — Z955 Presence of coronary angioplasty implant and graft: Secondary | ICD-10-CM | POA: Diagnosis not present

## 2018-04-07 DIAGNOSIS — Z23 Encounter for immunization: Secondary | ICD-10-CM | POA: Diagnosis not present

## 2018-04-07 DIAGNOSIS — M1990 Unspecified osteoarthritis, unspecified site: Secondary | ICD-10-CM | POA: Diagnosis not present

## 2018-04-07 DIAGNOSIS — F17210 Nicotine dependence, cigarettes, uncomplicated: Secondary | ICD-10-CM | POA: Diagnosis not present

## 2018-04-07 NOTE — Progress Notes (Signed)
   Progress Note  Patient Name: Jeff Russell Date of Encounter: 04/07/2018  Primary Cardiologist: No primary care provider on file.   Subjective   No chest pain or sob.   Inpatient Medications    Scheduled Meds: . acetaminophen  1,000 mg Oral BID  . gabapentin  300 mg Oral TID  . methocarbamol  500 mg Oral TID  . metoprolol succinate  25 mg Oral Daily  . rosuvastatin  20 mg Oral Daily  . sodium chloride flush  3 mL Intravenous Q12H  . sulfaSALAzine  1,000 mg Oral BID  . terbinafine  250 mg Oral Daily   Continuous Infusions: . sodium chloride     PRN Meds: sodium chloride, acetaminophen, cyclobenzaprine, ondansetron (ZOFRAN) IV, sodium chloride flush   Vital Signs    Vitals:   04/06/18 2038 04/07/18 0615 04/07/18 0802 04/07/18 0820  BP: 123/78 108/65 111/67 113/75  Pulse: 85 79 76 86  Resp: 19 18 15 15   Temp: (!) 97.4 F (36.3 C) 97.9 F (36.6 C) 98 F (36.7 C)   TempSrc: Oral Oral Oral   SpO2: 96% 97% 96% 96%  Weight:  111.2 kg    Height:        Intake/Output Summary (Last 24 hours) at 04/07/2018 5974 Last data filed at 04/06/2018 2317 Gross per 24 hour  Intake 637.19 ml  Output 550 ml  Net 87.19 ml   Filed Weights   04/06/18 1146 04/06/18 1737 04/07/18 0615  Weight: 115.2 kg 113.4 kg 111.2 kg    Telemetry    nsr with occaisional PVC's - Personally Reviewed  ECG    none - Personally Reviewed  Physical Exam   GEN: No acute distress.   Neck: 6 cm JVD Cardiac: RRR, no murmurs, rubs, or gallops.  Respiratory: Clear to auscultation bilaterally. GI: Soft, nontender, non-distended  MS: No edema; No deformity. Neuro:  Nonfocal  Psych: Normal affect   Labs    ChemistryNo results for input(s): NA, K, CL, CO2, GLUCOSE, BUN, CREATININE, CALCIUM, PROT, ALBUMIN, AST, ALT, ALKPHOS, BILITOT, GFRNONAA, GFRAA, ANIONGAP in the last 168 hours.   HematologyNo results for input(s): WBC, RBC, HGB, HCT, MCV, MCH, MCHC, RDW, PLT in the last 168  hours.  Cardiac EnzymesNo results for input(s): TROPONINI in the last 168 hours. No results for input(s): TROPIPOC in the last 168 hours.   BNPNo results for input(s): BNP, PROBNP in the last 168 hours.   DDimer No results for input(s): DDIMER in the last 168 hours.   Radiology    No results found.  Cardiac Studies   none  Patient Profile     53 y.o. male admitted for EPS/RFA of SVT  Assessment & Plan    1. SVT - he is s/p ablation of 2 concealed AP's, the first a right posteroseptal and the second a left posterolateral AP. He has had no SVT overnight and minimal bruising. He will be discharged home. No heavy lifting for a week. Continue all home meds. I will see him back in 3-5 weeks.  Gregg Taylor,M.D.  For questions or updates, please contact [facility] Please consult [email or web address] for contact info under Cardiology/STEMI.      Signed, Cristopher Peru, MD  04/07/2018, 8:32 AM  Patient ID: Leonie Douglas Pesantez, male   DOB: [birth year], 53 y.o.   MRN: [id number]

## 2018-04-07 NOTE — Discharge Summary (Addendum)
Discharge Summary    Patient ID: Jeff Russell MRN: [id number]; DOB: 12-21-1965  Admit date: 04/06/2018 Discharge date: 04/07/2018  Primary Care Provider: Lucretia Kern, DO  Primary Cardiologist: Christen Butter, MD Primary Electrophysiologist:  Cristopher Peru, MD   Discharge Diagnoses    Principal Problem:   SVT (supraventricular tachycardia) [facility])  **S/p catheter ablation this admission Active Problems:   Cigarette smoker   Allergies Allergies  Allergen Reactions  . Doxycycline Other (See Comments)    "burns his face" (Sun Sensitivity?)  . Humira [Adalimumab] Swelling and Rash    Diagnostic Studies/Procedures    Electrophysiology study with successful radiofrequency catheter ablation 12.31.2019  Results. 1. Baseline ECG. The baseline ECG demonstrates sinus rhythm with normal axis and intervals. Following ablation, there was no significant change in the baseline intervals. 2. Baseline intervals.The HV interval was 43 ms. The AH interval was 55 ms. Following ablation, there was no significant change in the baseline intervals. 3. Rapid ventricular pacing. Rapid ventricular pacing was carried out demonstrating eccentric atrial activation. VA block was observed at 270 ms.  4. Programmed ventricular stimulation. Programmed ventricular stimulation was carried out at 500/440 and decreased down to 200 ms resulting in persistent VA conduction over the right posteroseptal AP.  5. Programmed atrial stimulation. Programmed atrial stimulation was carried out at 500/440 and decreased down to 500/260 resulting in the atrial ERP.  6. Rapid atrial atrial pacing. Rapid atrial pacing was carried out demonstrating AV block at 300 ms. There was no evidence of ventricular pre-excitation. 7. Arrhythmias observed. AVRT. There were 2 SVT's. The first in the posteroseptal space resulting in SVT at 280. The second SVT was utilizing the left posterolateral AP at 256 ms. Both were inducible on isuprel.  8.  Mapping. Mapping during SVT as well as during ventricular pacing demonstrated initial earliest atrial activation in the right posteroseptal space. Subsequent to ablation there, the earlies atrial activation was around 4:00 o'clock on the mitral annulus. 9. Catheter ablation. Extensive catheter ablation was carried out first just anterior to the floor of the CS os. After the activation sequence changed, the RF energy was applied first to the atrial insertion of the AP followed by successful ablation when RF was applied to the ventricular insertion of the AP. Following ablation there was midline decremental VA conduction.  Complications: none immediately Estimated blood loss <50 mL.  _____________   History of Present Illness     67 y /o ? with a history of chest pressure, recent normal cardiac catheterization, tobacco abuse, and tachypalpitations with documented SVT who was recently evaluated by Dr. Lovena Le for consideration of EP study and catheter ablation.  Hospital Course     Consultants: None  Patient presented to the [facility] electrophysiology laboratory and underwent EP study which revealed 2 concealed accessory pathways, the first a right posterior lateral AP pathway with AVRT at 280 ms, and the second a left posterior septal AP with AVRT at 256 ms.  He underwent successful catheter ablation and following ablation, there was no evidence of any accessory pathway conduction or inducible SVT.  Patient tolerated procedure well and post procedure has not had any recurrent SVT.  He has been ambulating without difficulty and will be discharged home today in good condition. _____________  Discharge Vitals Blood pressure 113/75, pulse 86, temperature 98 F (36.7 C), temperature source Oral, resp. rate 15, height 6\' 3"  (1.905 m), weight 111.2 kg, SpO2 96 %.  Filed Weights   04/06/18 1146 04/06/18 1737 04/07/18  0615  Weight: 115.2 kg 113.4 kg 111.2 kg    Labs & Radiologic Studies    None  Disposition   Pt is being discharged home today in good condition.  Follow-up Plans & Appointments    Follow-up Information    Evans Lance, MD Follow up.   Specialty:  Cardiology Why:  05/05/18 @ 4:15PM Contact information: [address] [place] [address] [phone]           Discharge Medications   Allergies as of 04/07/2018      Reactions   Doxycycline Other (See Comments)   "burns his face" (Sun Sensitivity?)   Humira [adalimumab] Swelling, Rash      Medication List    TAKE these medications   ACTEMRA 162 MG/0.9ML Sosy Generic drug:  Tocilizumab INJECT 162MG  (1 SYRINGE) UNDER THE SKIN EVERY WEEK   cyclobenzaprine 10 MG tablet Commonly known as:  FLEXERIL Take 1 tablet (10 mg total) by mouth 3 (three) times daily as needed for muscle spasms. What changed:  when to take this   diltiazem 30 MG tablet Commonly known as:  CARDIZEM Take 30 mg by mouth daily as needed (Rapid heart rate).   esomeprazole 20 MG capsule Commonly known as:  NEXIUM Take 20 mg by mouth daily before breakfast.   fluticasone 50 MCG/ACT nasal spray Commonly known as:  FLONASE Place 2 sprays into both nostrils daily as needed for allergies or rhinitis.   gabapentin 300 MG capsule Commonly known as:  NEURONTIN TAKE 1 CASPULE BY MOUTH TWICE DAILY   methocarbamol 500 MG tablet Commonly known as:  ROBAXIN TAKE 1 TABLET BY MOUTH 3 TIMES DAILY, DONOT TAKE WITH CYCLOBENZAPRINE   metoprolol succinate 25 MG 24 hr tablet Commonly known as:  TOPROL-XL Take 25 mg by mouth daily.   rosuvastatin 20 MG tablet Commonly known as:  CRESTOR Take 20 mg by mouth daily.   sulfaSALAzine 500 MG EC tablet Commonly known as:  AZULFIDINE TAKE 2 TABLETS BY MOUTH TWICE DAILY   terbinafine 250 MG tablet Commonly known as:  LAMISIL Take 250 mg by mouth daily.   TYLENOL ARTHRITIS PAIN 650 MG CR tablet Generic drug:  acetaminophen Take 1,300 mg by mouth 2 (two)  times daily.   Vitamin D3 25 MCG (1000 UT) Caps Take 1,000 Units by mouth daily.         Outstanding Labs/Studies   None  Duration of Discharge Encounter   Greater than 30 minutes including physician time.  Signed, Murray Hodgkins, NP 04/07/2018, 9:15 AM  EP Attending  Patient seen and examined. Agree with above. The patient is stable for DC home. Usual followup.   Mikle Bosworth.D.

## 2018-04-07 NOTE — Plan of Care (Signed)
Right groin slightly swollen with ecchymosis but soft with small amount of bloody drainage under transparent dressing, will remove dressing and check sites then redressed, no other major problems noted, VS fairly stable but pt has been having some runs of PVC's with occasional PAC's, asymptomatic, will continue to monitor.

## 2018-04-08 ENCOUNTER — Encounter (HOSPITAL_COMMUNITY): Payer: Self-pay | Admitting: Internal Medicine

## 2018-04-08 MED FILL — Heparin Sod (Porcine)-NaCl IV Soln 1000 Unit/500ML-0.9%: INTRAVENOUS | Qty: 500 | Status: AC

## 2018-04-13 ENCOUNTER — Telehealth: Payer: Self-pay | Admitting: Internal Medicine

## 2018-04-13 NOTE — Telephone Encounter (Signed)
[name] (patients mom) is a current patient of Dr. Jenny Reichmann.  She would like to know if Dr. Jenny Reichmann would take Jeff Russell on as a patient.  Please advise.

## 2018-04-13 NOTE — Telephone Encounter (Signed)
Ok with me 

## 2018-04-13 NOTE — Telephone Encounter (Signed)
Patient scheduled.

## 2018-04-27 ENCOUNTER — Encounter: Payer: Self-pay | Admitting: Internal Medicine

## 2018-04-27 ENCOUNTER — Ambulatory Visit (INDEPENDENT_AMBULATORY_CARE_PROVIDER_SITE_OTHER): Payer: BLUE CROSS/BLUE SHIELD | Admitting: Internal Medicine

## 2018-04-27 ENCOUNTER — Other Ambulatory Visit (INDEPENDENT_AMBULATORY_CARE_PROVIDER_SITE_OTHER): Payer: BLUE CROSS/BLUE SHIELD

## 2018-04-27 VITALS — BP 112/68 | HR 77 | Temp 97.9°F | Ht 75.0 in | Wt 256.0 lb

## 2018-04-27 DIAGNOSIS — Z125 Encounter for screening for malignant neoplasm of prostate: Secondary | ICD-10-CM

## 2018-04-27 DIAGNOSIS — Z0000 Encounter for general adult medical examination without abnormal findings: Secondary | ICD-10-CM | POA: Insufficient documentation

## 2018-04-27 DIAGNOSIS — E559 Vitamin D deficiency, unspecified: Secondary | ICD-10-CM

## 2018-04-27 LAB — BASIC METABOLIC PANEL
BUN: 16 mg/dL (ref 6–23)
CO2: 30 mEq/L (ref 19–32)
Calcium: 9.9 mg/dL (ref 8.4–10.5)
Chloride: 104 mEq/L (ref 96–112)
Creatinine, Ser: 1.04 mg/dL (ref 0.40–1.50)
GFR: 74.86 mL/min (ref >60.00)
GLUCOSE: 94 mg/dL (ref 70–99)
Potassium: 4.8 mEq/L (ref 3.5–5.1)
Sodium: 140 mEq/L (ref 135–145)

## 2018-04-27 LAB — HEPATIC FUNCTION PANEL
ALBUMIN: 4.7 g/dL (ref 3.5–5.2)
ALT: 22 U/L (ref 0–53)
AST: 25 U/L (ref 0–37)
Alkaline Phosphatase: 77 U/L (ref 39–117)
Bilirubin, Direct: 0.1 mg/dL (ref 0.0–0.3)
Total Bilirubin: 0.6 mg/dL (ref 0.2–1.2)
Total Protein: 7 g/dL (ref 6.0–8.3)

## 2018-04-27 LAB — TSH: TSH: 1.19 u[IU]/mL (ref 0.35–4.50)

## 2018-04-27 LAB — CBC WITH DIFFERENTIAL/PLATELET
BASOS ABS: 0.1 10*3/uL (ref 0.0–0.1)
Basophils Relative: 0.9 % (ref 0.0–3.0)
Eosinophils Absolute: 0.3 10*3/uL (ref 0.0–0.7)
Eosinophils Relative: 3.9 % (ref 0.0–5.0)
HCT: 45 % (ref 39.0–52.0)
Hemoglobin: 15.5 g/dL (ref 13.0–17.0)
Lymphocytes Relative: 30.4 % (ref 12.0–46.0)
Lymphs Abs: 2.4 10*3/uL (ref 0.7–4.0)
MCHC: 34.4 g/dL (ref 30.0–36.0)
MCV: 102.2 fl — ABNORMAL HIGH (ref 78.0–100.0)
Monocytes Absolute: 0.6 10*3/uL (ref 0.1–1.0)
Monocytes Relative: 8.3 % (ref 3.0–12.0)
Neutro Abs: 4.4 10*3/uL (ref 1.4–7.7)
Neutrophils Relative %: 56.5 % (ref 43.0–77.0)
Platelets: 214 10*3/uL (ref 150.0–400.0)
RBC: 4.41 Mil/uL (ref 4.22–5.81)
RDW: 13 % (ref 11.5–15.5)
WBC: 7.8 10*3/uL (ref 4.0–10.5)

## 2018-04-27 LAB — LIPID PANEL
CHOLESTEROL: 110 mg/dL (ref 0–200)
HDL: 31 mg/dL — ABNORMAL LOW (ref >39.00)
LDL Cholesterol: 42 mg/dL (ref 0–99)
NonHDL: 78.59
TRIGLYCERIDES: 185 mg/dL — AB (ref 0.0–149.0)
Total CHOL/HDL Ratio: 4
VLDL: 37 mg/dL (ref 0.0–40.0)

## 2018-04-27 LAB — PSA: PSA: 0.6 ng/mL (ref 0.10–4.00)

## 2018-04-27 NOTE — Patient Instructions (Addendum)
Please continue all other medications as before, and refills have been done if requested.  Please stop smoking  Please have the pharmacy call with any other refills you may need.  Please continue your efforts at being more active, low cholesterol diet, and weight control.  You are otherwise up to date with prevention measures today.  Please keep your appointments with your specialists as you may have planned  Please go to the LAB in the Basement (turn left off the elevator) for the tests to be done today  You will be contacted by phone if any changes need to be made immediately.  Otherwise, you will receive a letter about your results with an explanation, but please check with MyChart first.  Please remember to sign up for MyChart if you have not done so, as this will be important to you in the future with finding out test results, communicating by private email, and scheduling acute appointments online when needed.  Please return in 1 year for your yearly visit, or sooner if needed, with Lab testing done 3-5 days before

## 2018-04-27 NOTE — Progress Notes (Signed)
Subjective:    Patient ID: Jeff Russell, male    DOB: 12-20-1965, 53 y.o.   MRN: [id number]  HPI  Here for wellness and f/u;  Overall doing ok;  Pt denies Chest pain, worsening SOB, DOE, wheezing, orthopnea, PND, worsening LE edema, palpitations, dizziness or syncope.  Pt denies neurological change such as new headache, facial or extremity weakness.  Pt denies polydipsia, polyuria, or low sugar symptoms. Pt states overall good compliance with treatment and medications, good tolerability, and has been trying to follow appropriate diet.  Pt denies worsening depressive symptoms, suicidal ideation or panic. No fever, night sweats, wt loss, loss of appetite, or other constitutional symptoms.  Pt states good ability with ADL's, has low fall risk, home safety reviewed and adequate, no other significant changes in hearing or vision, and only occasionally active with exercise. Not quite ready to quit smoking.  Taking Vit D supplement.  No acute complaints Past Medical History:  Diagnosis Date  . Allergy   . Arthritis   . COPD (chronic obstructive pulmonary disease) ([place])    per pt  . GERD (gastroesophageal reflux disease)   . History of MRSA infection    right leg per patient  . MSSA (methicillin susceptible Staphylococcus aureus) infection 05/01/2011  . Pneumonia   . Rheumatoid nodules ([place]) 08/06/2014   right lung-per CT scan ordered by Dr Estanislado Pandy  . Septic bursitis 05/01/2011  . Shortness of breath    occ  . SVT (supraventricular tachycardia) ([place])    a. 03/2018 s/p RFCA of 2 concealed accessory pathways (R postlat w/ AVRT @ 280 ms & L postsept w/ AVRT @ 256 ms).   Past Surgical History:  Procedure Laterality Date  . BACK SURGERY    . carpel tunnel Bilateral   . CHOLECYSTECTOMY    . COLONOSCOPY    . EPIDURAL BLOCK INJECTION    . EYE SURGERY Left    wire in eye  . I&D EXTREMITY  03/24/2011   Procedure: IRRIGATION AND DEBRIDEMENT EXTREMITY;  Surgeon: Yvette Rack., MD;  Location: [facility];  Service: Orthopedics;  Laterality: Right;  Irrigation and debridement right knee abcess and Bursa,  application of wound vac  . I&D EXTREMITY  03/28/2011   Procedure: IRRIGATION AND DEBRIDEMENT EXTREMITY;  Surgeon: Yvette Rack., MD;  Location: [facility];  Service: Orthopedics;  Laterality: Right;  I&D right knee with wound closure  . LEFT HEART CATH AND CORONARY ANGIOGRAPHY N/A 03/16/2018   Procedure: LEFT HEART CATH AND CORONARY ANGIOGRAPHY;  Surgeon: Adrian Prows, MD;  Location: [facility];  Service: Cardiovascular;  Laterality: N/A;  . MENISCUS REPAIR Right 2017  . SVT ABLATION N/A 04/06/2018   Procedure: SVT ABLATION;  Surgeon: Evans Lance, MD;  Location: [facility];  Service: Cardiovascular;  Laterality: N/A;    reports that he has been smoking cigarettes. He has a 60.00 pack-year smoking history. He has never used smokeless tobacco. He reports that he does not drink alcohol or use drugs. family history includes Angina in his maternal grandmother; Arthritis in his maternal grandmother; Colon cancer in his paternal grandfather; Congestive Heart Failure in his paternal grandmother; Heart disease in his mother; Hypercholesterolemia in his mother; Hypertension in his mother; Other in his mother; Prostate cancer in his father. Allergies  Allergen Reactions  . Doxycycline Other (See Comments)    "burns his face" (Sun Sensitivity?)  . Humira [Adalimumab] Swelling and Rash   Current Outpatient Medications on File  Prior to Visit  Medication Sig Dispense Refill  . acetaminophen (TYLENOL ARTHRITIS PAIN) 650 MG CR tablet Take 1,300 mg by mouth 2 (two) times daily.     . ACTEMRA 162 MG/0.9ML SOSY INJECT 162MG  (1 SYRINGE) UNDER THE SKIN EVERY WEEK 12 Syringe 0  . Cholecalciferol (VITAMIN D3) 1000 units CAPS Take 1,000 Units by mouth daily.    . cyclobenzaprine (FLEXERIL) 10 MG tablet Take 1 tablet (10 mg total) by mouth 3 (three) times daily as needed for muscle spasms. (Patient  taking differently: Take 10 mg by mouth at bedtime. ) 90 tablet 0  . diltiazem (CARDIZEM) 30 MG tablet Take 30 mg by mouth daily as needed (Rapid heart rate).   0  . esomeprazole (NEXIUM) 20 MG capsule Take 20 mg by mouth daily before breakfast.    . fluticasone (FLONASE) 50 MCG/ACT nasal spray Place 2 sprays into both nostrils daily as needed for allergies or rhinitis.    Marland Kitchen gabapentin (NEURONTIN) 300 MG capsule TAKE 1 CASPULE BY MOUTH TWICE DAILY 270 capsule 0  . methocarbamol (ROBAXIN) 500 MG tablet TAKE 1 TABLET BY MOUTH 3 TIMES DAILY, DONOT TAKE WITH CYCLOBENZAPRINE 90 tablet 0  . metoprolol succinate (TOPROL-XL) 25 MG 24 hr tablet Take 25 mg by mouth daily.    . rosuvastatin (CRESTOR) 20 MG tablet Take 20 mg by mouth daily.    Marland Kitchen sulfaSALAzine (AZULFIDINE) 500 MG EC tablet TAKE 2 TABLETS BY MOUTH TWICE DAILY 360 tablet 0  . terbinafine (LAMISIL) 250 MG tablet Take 250 mg by mouth daily.     No current facility-administered medications on file prior to visit.    Review of Systems Constitutional: Negative for other unusual diaphoresis, sweats, appetite or weight changes HENT: Negative for other worsening hearing loss, ear pain, facial swelling, mouth sores or neck stiffness.   Eyes: Negative for other worsening pain, redness or other visual disturbance.  Respiratory: Negative for other stridor or swelling Cardiovascular: Negative for other palpitations or other chest pain  Gastrointestinal: Negative for worsening diarrhea or loose stools, blood in stool, distention or other pain Genitourinary: Negative for hematuria, flank pain or other change in urine volume.  Musculoskeletal: Negative for myalgias or other joint swelling.  Skin: Negative for other color change, or other wound or worsening drainage.  Neurological: Negative for other syncope or numbness. Hematological: Negative for other adenopathy or swelling Psychiatric/Behavioral: Negative for hallucinations, other worsening agitation,  SI, self-injury, or new decreased concentration All other system neg per pt    Objective:   Physical Exam BP 112/68   Pulse 77   Temp 97.9 F (36.6 C) (Oral)   Ht 6\' 3"  (1.905 m)   Wt 256 lb (116.1 kg)   SpO2 94%   BMI 32.00 kg/m  VS noted,  Constitutional: Pt is oriented to person, place, and time. Appears well-developed and well-nourished, in no significant distress and comfortable Head: Normocephalic and atraumatic  Eyes: Conjunctivae and EOM are normal. Pupils are equal, round, and reactive to light Right Ear: External ear normal without discharge Left Ear: External ear normal without discharge Nose: Nose without discharge or deformity Mouth/Throat: Oropharynx is without other ulcerations and moist  Neck: Normal range of motion. Neck supple. No JVD present. No tracheal deviation present or significant neck LA or mass Cardiovascular: Normal rate, regular rhythm, normal heart sounds and intact distal pulses.   Pulmonary/Chest: WOB normal and breath sounds without rales or wheezing  Abdominal: Soft. Bowel sounds are normal. NT. No HSM  Musculoskeletal:  Normal range of motion. Exhibits no edema Lymphadenopathy: Has no other cervical adenopathy.  Neurological: Pt is alert and oriented to person, place, and time. Pt has normal reflexes. No cranial nerve deficit. Motor grossly intact, Gait intact Skin: Skin is warm and dry. No rash noted or new ulcerations Psychiatric:  Has normal mood and affect. Behavior is normal without agitation No other exam findings Lab Results  Component Value Date   WBC 7.8 04/27/2018   HGB 15.5 04/27/2018   HCT 45.0 04/27/2018   PLT 214.0 04/27/2018   GLUCOSE 94 04/27/2018   CHOL 110 04/27/2018   TRIG 185.0 (H) 04/27/2018   HDL 31.00 (L) 04/27/2018   LDLDIRECT 58 03/16/2018   LDLCALC 42 04/27/2018   ALT 22 04/27/2018   AST 25 04/27/2018   NA 140 04/27/2018   K 4.8 04/27/2018   CL 104 04/27/2018   CREATININE 1.04 04/27/2018   BUN 16 04/27/2018     CO2 30 04/27/2018   TSH 1.19 04/27/2018   PSA 0.60 04/27/2018   INR 1.08 03/24/2011   HGBA1C 5.0 03/04/2017       Assessment & Plan:

## 2018-04-30 ENCOUNTER — Other Ambulatory Visit: Payer: Self-pay | Admitting: Rheumatology

## 2018-04-30 ENCOUNTER — Ambulatory Visit (INDEPENDENT_AMBULATORY_CARE_PROVIDER_SITE_OTHER): Payer: BLUE CROSS/BLUE SHIELD | Admitting: Internal Medicine

## 2018-04-30 ENCOUNTER — Encounter: Payer: Self-pay | Admitting: Internal Medicine

## 2018-04-30 VITALS — BP 98/60 | HR 73 | Ht 75.0 in | Wt 250.4 lb

## 2018-04-30 DIAGNOSIS — I471 Supraventricular tachycardia: Secondary | ICD-10-CM | POA: Diagnosis not present

## 2018-04-30 NOTE — Progress Notes (Signed)
HPI Jeff Russell returns today for followup. He is a pleasant 53 yo man with 2 distinct AP's and incessant SVT. He underwent EP study and a difficult catheter ablation several weeks ago. In the interim, he has done well. He notes a few seconds of palpitations. He denies pain in his groin or neck. No anginal symptoms. He is [place] smoking. Allergies  Allergen Reactions  . Doxycycline Other (See Comments)    "burns his face" (Sun Sensitivity?)  . Humira [Adalimumab] Swelling and Rash     Current Outpatient Medications  Medication Sig Dispense Refill  . acetaminophen (TYLENOL ARTHRITIS PAIN) 650 MG CR tablet Take 1,300 mg by mouth 2 (two) times daily.     . ACTEMRA 162 MG/0.9ML SOSY INJECT 162MG  (1 SYRINGE) UNDER THE SKIN EVERY WEEK 12 Syringe 0  . Cholecalciferol (VITAMIN D3) 1000 units CAPS Take 1,000 Units by mouth daily.    . cyclobenzaprine (FLEXERIL) 10 MG tablet Take 1 tablet (10 mg total) by mouth 3 (three) times daily as needed for muscle spasms. (Patient taking differently: Take 10 mg by mouth at bedtime. ) 90 tablet 0  . diltiazem (CARDIZEM) 30 MG tablet Take 30 mg by mouth daily as needed (Rapid heart rate).   0  . esomeprazole (NEXIUM) 20 MG capsule Take 20 mg by mouth daily before breakfast.    . fluticasone (FLONASE) 50 MCG/ACT nasal spray Place 2 sprays into both nostrils daily as needed for allergies or rhinitis.    Marland Kitchen gabapentin (NEURONTIN) 300 MG capsule TAKE 1 CASPULE BY MOUTH TWICE DAILY 270 capsule 0  . methocarbamol (ROBAXIN) 500 MG tablet TAKE 1 TABLET BY MOUTH 3 TIMES DAILY, DONOT TAKE WITH CYCLOBENZAPRINE 90 tablet 0  . metoprolol succinate (TOPROL-XL) 25 MG 24 hr tablet Take 25 mg by mouth daily.    . rosuvastatin (CRESTOR) 20 MG tablet Take 20 mg by mouth daily.    Marland Kitchen sulfaSALAzine (AZULFIDINE) 500 MG EC tablet TAKE 2 TABLETS BY MOUTH TWICE DAILY 360 tablet 0  . terbinafine (LAMISIL) 250 MG tablet Take 250 mg by mouth daily.     No current facility-administered  medications for this visit.      Past Medical History:  Diagnosis Date  . Allergy   . Arthritis   . COPD (chronic obstructive pulmonary disease) ([place])    per pt  . GERD (gastroesophageal reflux disease)   . History of MRSA infection    right leg per patient  . MSSA (methicillin susceptible Staphylococcus aureus) infection 05/01/2011  . Pneumonia   . Rheumatoid nodules ([place]) 08/06/2014   right lung-per CT scan ordered by Dr Estanislado Pandy  . Septic bursitis 05/01/2011  . Shortness of breath    occ  . SVT (supraventricular tachycardia) ([place])    a. 03/2018 s/p RFCA of 2 concealed accessory pathways (R postlat w/ AVRT @ 280 ms & L postsept w/ AVRT @ 256 ms).    ROS:   All systems reviewed and negative except as noted in the HPI.   Past Surgical History:  Procedure Laterality Date  . BACK SURGERY    . carpel tunnel Bilateral   . CHOLECYSTECTOMY    . COLONOSCOPY    . EPIDURAL BLOCK INJECTION    . EYE SURGERY Left    wire in eye  . I&D EXTREMITY  03/24/2011   Procedure: IRRIGATION AND DEBRIDEMENT EXTREMITY;  Surgeon: Yvette Rack., MD;  Location: [facility];  Service: Orthopedics;  Laterality: Right;  Irrigation and  debridement right knee abcess and Bursa,  application of wound vac  . I&D EXTREMITY  03/28/2011   Procedure: IRRIGATION AND DEBRIDEMENT EXTREMITY;  Surgeon: Yvette Rack., MD;  Location: [facility];  Service: Orthopedics;  Laterality: Right;  I&D right knee with wound closure  . LEFT HEART CATH AND CORONARY ANGIOGRAPHY N/A 03/16/2018   Procedure: LEFT HEART CATH AND CORONARY ANGIOGRAPHY;  Surgeon: Adrian Prows, MD;  Location: [facility];  Service: Cardiovascular;  Laterality: N/A;  . MENISCUS REPAIR Right 2017  . SVT ABLATION N/A 04/06/2018   Procedure: SVT ABLATION;  Surgeon: Evans Lance, MD;  Location: [facility];  Service: Cardiovascular;  Laterality: N/A;     Family History  Problem Relation Age of Onset  . Arthritis Maternal Grandmother   . Angina  Maternal Grandmother   . Colon cancer Paternal Grandfather        deceased  . Prostate cancer Father   . Hypercholesterolemia Mother   . Heart disease Mother        palpitations, ?heart failure  . Hypertension Mother   . Other Mother        Heart enlargement,left ventricle problems  . Congestive Heart Failure Paternal Grandmother   . Esophageal cancer Neg Hx   . Rectal cancer Neg Hx   . Stomach cancer Neg Hx      Social History   Socioeconomic History  . Marital status: Married    Spouse name: Not on file  . Number of children: Not on file  . Years of education: Not on file  . Highest education level: Not on file  Occupational History  . Not on file  Social Needs  . Financial resource strain: Not on file  . Food insecurity:    Worry: Not on file    Inability: Not on file  . Transportation needs:    Medical: Not on file    Non-medical: Not on file  Tobacco Use  . Smoking status: Current Every Day Smoker    Packs/day: 2.00    Years: 30.00    Pack years: 60.00    Types: Cigarettes  . Smokeless tobacco: Never Used  Substance and Sexual Activity  . Alcohol use: No    Comment: quit 20 yrs ago  . Drug use: No  . Sexual activity: Yes  Lifestyle  . Physical activity:    Days per week: Not on file    Minutes per session: Not on file  . Stress: Not on file  Relationships  . Social connections:    Talks on phone: Not on file    Gets together: Not on file    Attends religious service: Not on file    Active member of club or organization: Not on file    Attends meetings of clubs or organizations: Not on file    Relationship status: Not on file  . Intimate partner violence:    Fear of current or ex partner: Not on file    Emotionally abused: Not on file    Physically abused: Not on file    Forced sexual activity: Not on file  Other Topics Concern  . Not on file  Social History Narrative   Work or School: lawn care      Home Situation: lives with wife whom sees  me as well       Spiritual Beliefs: believes in God; does not go to church      Lifestyle: no CV exercise - but  physically active at work; diet is poor        BP 98/60   Pulse 73   Ht 6\' 3"  (1.905 m)   Wt 250 lb 6.4 oz (113.6 kg)   SpO2 98%   BMI 31.30 kg/m   Physical Exam:  Well appearing NAD HEENT: Unremarkable Neck:  No JVD, no thyromegally Lymphatics:  No adenopathy Back:  No CVA tenderness Lungs:  Clear HEART:  Regular rate rhythm, no murmurs, no rubs, no clicks Abd:  soft, positive bowel sounds, no organomegally, no rebound, no guarding Ext:  2 plus pulses, no edema, no cyanosis, no clubbing Skin:  No rashes no nodules Neuro:  CN II through XII intact, motor grossly intact  EKG - nsr with no pre-excitation   Assess/Plan: 1. SVT/WPW - he is s/p ablation of a right posteroseptal and a left posterolateral AP. He has not had any additional sustained SVT. He will undergo watchful waiting. 2. Tobacco abuse - I counseled him on the importance of stopping smoking.  3. Arthritis - he has multiple aches and pains. I encouraged him to exercise as he is able.  [email or web address] [email or web address].

## 2018-04-30 NOTE — Patient Instructions (Addendum)
Medication Instructions:  Your physician recommends that you continue on your current medications as directed. Please refer to the Current Medication list given to you today.  Labwork: None ordered.  Testing/Procedures: None ordered.  Follow-Up: Your physician wants you to follow-up in: as needed with Dr. Taylor.      Any Other Special Instructions Will Be Listed Below (If Applicable).  If you need a refill on your cardiac medications before your next appointment, please call your pharmacy.   

## 2018-04-30 NOTE — Telephone Encounter (Signed)
Last Visit: 03/19/18 Next Visit: 08/19/18 Labs: 04/27/18 Stable  Okay to refill per [name]

## 2018-04-30 NOTE — Addendum Note (Signed)
Addended by: Rose Phi on: 04/30/2018 05:06 PM   Modules accepted: Orders

## 2018-05-04 ENCOUNTER — Ambulatory Visit: Payer: BLUE CROSS/BLUE SHIELD | Admitting: Internal Medicine

## 2018-06-16 ENCOUNTER — Ambulatory Visit: Payer: BLUE CROSS/BLUE SHIELD | Admitting: Internal Medicine

## 2018-06-16 ENCOUNTER — Encounter: Payer: Self-pay | Admitting: Internal Medicine

## 2018-06-16 ENCOUNTER — Other Ambulatory Visit: Payer: Self-pay

## 2018-06-16 VITALS — BP 120/80 | HR 82 | Temp 97.9°F | Ht 75.0 in | Wt 251.0 lb

## 2018-06-16 DIAGNOSIS — L03115 Cellulitis of right lower limb: Secondary | ICD-10-CM

## 2018-06-16 DIAGNOSIS — M0579 Rheumatoid arthritis with rheumatoid factor of multiple sites without organ or systems involvement: Secondary | ICD-10-CM | POA: Diagnosis not present

## 2018-06-16 DIAGNOSIS — J449 Chronic obstructive pulmonary disease, unspecified: Secondary | ICD-10-CM | POA: Diagnosis not present

## 2018-06-16 MED ORDER — CLINDAMYCIN HCL 300 MG PO CAPS: 300.0000 mg | ORAL_CAPSULE | Freq: Four times a day (QID) | ORAL | 0 refills | Status: DC

## 2018-06-16 NOTE — Progress Notes (Signed)
Subjective:    Patient ID: Jeff Russell, male    DOB: 04-01-1966, 53 y.o.   MRN: [id number]  HPI  Here to f/u with c.o 2-3 days acute onset area of the distal anterior RLE with redness, swelling and tender; no red streaks, fever, chills or other except maybe some malaise;  No current drainage, though has hx of MRSA abscess to the same leg requiring antibiotic, ortho care and wound vac for some time.  Pt denies chest pain, increased sob or doe, wheezing, orthopnea, PND, increased LE swelling, palpitations, dizziness or syncope.  Pt denies new neurological symptoms such as new headache, or facial or extremity weakness or numbness   Pt denies polydipsia, polyuria Past Medical History:  Diagnosis Date  . Allergy   . Arthritis   . COPD (chronic obstructive pulmonary disease) ([place])    per pt  . GERD (gastroesophageal reflux disease)   . History of MRSA infection    right leg per patient  . MSSA (methicillin susceptible Staphylococcus aureus) infection 05/01/2011  . Pneumonia   . Rheumatoid nodules ([place]) 08/06/2014   right lung-per CT scan ordered by Dr Estanislado Pandy  . Septic bursitis 05/01/2011  . Shortness of breath    occ  . SVT (supraventricular tachycardia) ([place])    a. 03/2018 s/p RFCA of 2 concealed accessory pathways (R postlat w/ AVRT @ 280 ms & L postsept w/ AVRT @ 256 ms).   Past Surgical History:  Procedure Laterality Date  . BACK SURGERY    . carpel tunnel Bilateral   . CHOLECYSTECTOMY    . COLONOSCOPY    . EPIDURAL BLOCK INJECTION    . EYE SURGERY Left    wire in eye  . I&D EXTREMITY  03/24/2011   Procedure: IRRIGATION AND DEBRIDEMENT EXTREMITY;  Surgeon: Yvette Rack., MD;  Location: [facility];  Service: Orthopedics;  Laterality: Right;  Irrigation and debridement right knee abcess and Bursa,  application of wound vac  . I&D EXTREMITY  03/28/2011   Procedure: IRRIGATION AND DEBRIDEMENT EXTREMITY;  Surgeon: Yvette Rack., MD;  Location: [facility];  Service: Orthopedics;   Laterality: Right;  I&D right knee with wound closure  . LEFT HEART CATH AND CORONARY ANGIOGRAPHY N/A 03/16/2018   Procedure: LEFT HEART CATH AND CORONARY ANGIOGRAPHY;  Surgeon: Adrian Prows, MD;  Location: [facility];  Service: Cardiovascular;  Laterality: N/A;  . MENISCUS REPAIR Right 2017  . SVT ABLATION N/A 04/06/2018   Procedure: SVT ABLATION;  Surgeon: Evans Lance, MD;  Location: [facility];  Service: Cardiovascular;  Laterality: N/A;    reports that he has been smoking cigarettes. He has a 60.00 pack-year smoking history. He has never used smokeless tobacco. He reports that he does not drink alcohol or use drugs. family history includes Angina in his maternal grandmother; Arthritis in his maternal grandmother; Colon cancer in his paternal grandfather; Congestive Heart Failure in his paternal grandmother; Heart disease in his mother; Hypercholesterolemia in his mother; Hypertension in his mother; Other in his mother; Prostate cancer in his father. Allergies  Allergen Reactions  . Doxycycline Other (See Comments)    "burns his face" (Sun Sensitivity?)  . Humira [Adalimumab] Swelling and Rash   Current Outpatient Medications on File Prior to Visit  Medication Sig Dispense Refill  . acetaminophen (TYLENOL ARTHRITIS PAIN) 650 MG CR tablet Take 1,300 mg by mouth 2 (two) times daily.     . ACTEMRA 162 MG/0.9ML SOSY INJECT 162MG  (1  SYRINGE) UNDER THE SKIN EVERY WEEK 12 Syringe 0  . Cholecalciferol (VITAMIN D3) 1000 units CAPS Take 1,000 Units by mouth daily.    . cyclobenzaprine (FLEXERIL) 10 MG tablet Take 1 tablet (10 mg total) by mouth 3 (three) times daily as needed for muscle spasms. (Patient taking differently: Take 10 mg by mouth at bedtime. ) 90 tablet 0  . diltiazem (CARDIZEM) 30 MG tablet Take 30 mg by mouth daily as needed (Rapid heart rate).   0  . esomeprazole (NEXIUM) 20 MG capsule Take 20 mg by mouth daily before breakfast.    . fluticasone (FLONASE) 50 MCG/ACT  nasal spray Place 2 sprays into both nostrils daily as needed for allergies or rhinitis.    Marland Kitchen gabapentin (NEURONTIN) 300 MG capsule TAKE 1 CASPULE BY MOUTH TWICE DAILY 270 capsule 0  . methocarbamol (ROBAXIN) 500 MG tablet TAKE 1 TABLET BY MOUTH 3 TIMES DAILY, DONOT TAKE WITH CYCLOBENZAPRINE 90 tablet 0  . metoprolol succinate (TOPROL-XL) 25 MG 24 hr tablet Take 25 mg by mouth daily.    . rosuvastatin (CRESTOR) 20 MG tablet Take 20 mg by mouth daily.    Marland Kitchen sulfaSALAzine (AZULFIDINE) 500 MG EC tablet TAKE 2 TABLETS BY MOUTH TWICE DAILY 360 tablet 0  . terbinafine (LAMISIL) 250 MG tablet Take 250 mg by mouth daily.     No current facility-administered medications on file prior to visit.    Review of Systems  Constitutional: Negative for other unusual diaphoresis or sweats HENT: Negative for ear discharge or swelling Eyes: Negative for other worsening visual disturbances Respiratory: Negative for stridor or other swelling  Gastrointestinal: Negative for worsening distension or other blood Genitourinary: Negative for retention or other urinary change Musculoskeletal: Negative for other MSK pain or swelling Skin: Negative for color change or other new lesions Neurological: Negative for worsening tremors and other numbness  Psychiatric/Behavioral: Negative for worsening agitation or other fatigue All other system neg per pt    Objective:   Physical Exam BP 120/80   Pulse 82   Temp 97.9 F (36.6 C) (Oral)   Ht 6\' 3"  (1.905 m)   Wt 251 lb (113.9 kg)   SpO2 96%   BMI 31.37 kg/m  VS noted, non toxic, afeb Constitutional: Pt appears in NAD HENT: Head: NCAT.  Right Ear: External ear normal.  Left Ear: External ear normal.  Eyes: . Pupils are equal, round, and reactive to light. Conjunctivae and EOM are normal Nose: without d/c or deformity Neck: Neck supple. Gross normal ROM Cardiovascular: Normal rate and regular rhythm.   Pulmonary/Chest: Effort normal and breath sounds without rales  or wheezing.  Neurological: Pt is alert. At baseline orientation, motor grossly intact Skin: hgas approx 4x4 cm area distal anterior RLE erythema, tender, warm above the ankle, but assoc with local swelling extending somewhat to the distal foot, o/w neurovasc intact Psychiatric: Pt behavior is normal without agitation  No other exam findings Lab Results  Component Value Date   WBC 7.8 04/27/2018   HGB 15.5 04/27/2018   HCT 45.0 04/27/2018   PLT 214.0 04/27/2018   GLUCOSE 94 04/27/2018   CHOL 110 04/27/2018   TRIG 185.0 (H) 04/27/2018   HDL 31.00 (L) 04/27/2018   LDLDIRECT 58 03/16/2018   LDLCALC 42 04/27/2018   ALT 22 04/27/2018   AST 25 04/27/2018   NA 140 04/27/2018   K 4.8 04/27/2018   CL 104 04/27/2018   CREATININE 1.04 04/27/2018   BUN 16 04/27/2018   CO2 30  04/27/2018   TSH 1.19 04/27/2018   PSA 0.60 04/27/2018   INR 1.08 03/24/2011   HGBA1C 5.0 03/04/2017         Assessment & Plan:

## 2018-06-16 NOTE — Patient Instructions (Signed)
Please take all new medication as prescribed - the antibiotic  Please continue all other medications as before, and refills have been done if requested.  Please have the pharmacy call with any other refills you may need.  Please continue your efforts at being more active, low cholesterol diet, and weight control.  Please keep your appointments with your specialists as you may have planned    

## 2018-06-16 NOTE — Assessment & Plan Note (Signed)
Will need to hold his immunosuppressant while on antibiotic until resolved

## 2018-06-16 NOTE — Assessment & Plan Note (Signed)
stable overall by history and exam, recent data reviewed with pt, and pt to continue medical treatment as before,  to f/u any worsening symptoms or concerns  

## 2018-06-16 NOTE — Assessment & Plan Note (Signed)
With hx of MRSA abscess to same leg; exam c/w cellulitis, for cleocin 300 qid course, warned of possible diarrhea, may consider stop at 7 days if resolved erythema, tender, swelling

## 2018-06-17 ENCOUNTER — Telehealth: Payer: Self-pay | Admitting: Rheumatology

## 2018-06-17 NOTE — Telephone Encounter (Signed)
Attempted to contact the patient and left message for patient to call the office. Patient will need to hold SSZ and Actemra.

## 2018-06-17 NOTE — Telephone Encounter (Signed)
Patient left a voicemail stating he saw his PCP Dr. Jenny Reichmann who prescribed antibiotics for 7-10 days for possible cellulitis/mercer.  Patient states he was told to stop the injections.  Patient requested a return call.

## 2018-06-21 ENCOUNTER — Other Ambulatory Visit: Payer: Self-pay | Admitting: Cardiology

## 2018-06-22 NOTE — Progress Notes (Deleted)
Subjective:  Primary Physician:  Biagio Borg, MD  Patient ID: Jeff Russell, male    DOB: [birth year], 53 y.o.   MRN: [id number]  No chief complaint on file.   HPI: Jeff Russell  is a 53 y.o. male  with ***  Past Medical History:  Diagnosis Date  . Allergy   . Arthritis   . COPD (chronic obstructive pulmonary disease) ([place])    per pt  . GERD (gastroesophageal reflux disease)   . History of MRSA infection    right leg per patient  . MSSA (methicillin susceptible Staphylococcus aureus) infection 05/01/2011  . Pneumonia   . Rheumatoid nodules ([place]) 08/06/2014   right lung-per CT scan ordered by Dr Estanislado Pandy  . Septic bursitis 05/01/2011  . Shortness of breath    occ  . SVT (supraventricular tachycardia) ([place])    a. 03/2018 s/p RFCA of 2 concealed accessory pathways (R postlat w/ AVRT @ 280 ms & L postsept w/ AVRT @ 256 ms).    Past Surgical History:  Procedure Laterality Date  . BACK SURGERY    . carpel tunnel Bilateral   . CHOLECYSTECTOMY    . COLONOSCOPY    . EPIDURAL BLOCK INJECTION    . EYE SURGERY Left    wire in eye  . I&D EXTREMITY  03/24/2011   Procedure: IRRIGATION AND DEBRIDEMENT EXTREMITY;  Surgeon: Yvette Rack., MD;  Location: [facility];  Service: Orthopedics;  Laterality: Right;  Irrigation and debridement right knee abcess and Bursa,  application of wound vac  . I&D EXTREMITY  03/28/2011   Procedure: IRRIGATION AND DEBRIDEMENT EXTREMITY;  Surgeon: Yvette Rack., MD;  Location: [facility];  Service: Orthopedics;  Laterality: Right;  I&D right knee with wound closure  . LEFT HEART CATH AND CORONARY ANGIOGRAPHY N/A 03/16/2018   Procedure: LEFT HEART CATH AND CORONARY ANGIOGRAPHY;  Surgeon: Adrian Prows, MD;  Location: [facility];  Service: Cardiovascular;  Laterality: N/A;  . MENISCUS REPAIR Right 2017  . SVT ABLATION N/A 04/06/2018   Procedure: SVT ABLATION;  Surgeon: Evans Lance, MD;  Location: [facility];  Service: Cardiovascular;   Laterality: N/A;    Social History   Socioeconomic History  . Marital status: Married    Spouse name: Not on file  . Number of children: Not on file  . Years of education: Not on file  . Highest education level: Not on file  Occupational History  . Not on file  Social Needs  . Financial resource strain: Not on file  . Food insecurity:    Worry: Not on file    Inability: Not on file  . Transportation needs:    Medical: Not on file    Non-medical: Not on file  Tobacco Use  . Smoking status: Current Every Day Smoker    Packs/day: 2.00    Years: 30.00    Pack years: 60.00    Types: Cigarettes  . Smokeless tobacco: Never Used  Substance and Sexual Activity  . Alcohol use: No    Comment: quit 20 yrs ago  . Drug use: No  . Sexual activity: Yes  Lifestyle  . Physical activity:    Days per week: Not on file    Minutes per session: Not on file  . Stress: Not on file  Relationships  . Social connections:    Talks on phone: Not on file    Gets together: Not on file  Attends religious service: Not on file    Active member of club or organization: Not on file    Attends meetings of clubs or organizations: Not on file    Relationship status: Not on file  . Intimate partner violence:    Fear of current or ex partner: Not on file    Emotionally abused: Not on file    Physically abused: Not on file    Forced sexual activity: Not on file  Other Topics Concern  . Not on file  Social History Narrative   Work or School: lawn care      Home Situation: lives with wife whom sees me as well       Spiritual Beliefs: believes in God; does not go to church      Lifestyle: no CV exercise - but physically active at work; diet is poor       Current Outpatient Medications on File Prior to Visit  Medication Sig Dispense Refill  . acetaminophen (TYLENOL ARTHRITIS PAIN) 650 MG CR tablet Take 1,300 mg by mouth 2 (two) times daily.     . ACTEMRA 162 MG/0.9ML SOSY INJECT 162MG  (1  SYRINGE) UNDER THE SKIN EVERY WEEK 12 Syringe 0  . Cholecalciferol (VITAMIN D3) 1000 units CAPS Take 1,000 Units by mouth daily.    . clindamycin (CLEOCIN) 300 MG capsule Take 1 capsule (300 mg total) by mouth 4 (four) times daily. 40 capsule 0  . cyclobenzaprine (FLEXERIL) 10 MG tablet Take 1 tablet (10 mg total) by mouth 3 (three) times daily as needed for muscle spasms. (Patient taking differently: Take 10 mg by mouth at bedtime. ) 90 tablet 0  . diltiazem (CARDIZEM) 30 MG tablet Take 30 mg by mouth daily as needed (Rapid heart rate).   0  . esomeprazole (NEXIUM) 20 MG capsule Take 20 mg by mouth daily before breakfast.    . fluticasone (FLONASE) 50 MCG/ACT nasal spray Place 2 sprays into both nostrils daily as needed for allergies or rhinitis.    Marland Kitchen gabapentin (NEURONTIN) 300 MG capsule TAKE 1 CASPULE BY MOUTH TWICE DAILY 270 capsule 0  . methocarbamol (ROBAXIN) 500 MG tablet TAKE 1 TABLET BY MOUTH 3 TIMES DAILY, DONOT TAKE WITH CYCLOBENZAPRINE 90 tablet 0  . metoprolol succinate (TOPROL-XL) 25 MG 24 hr tablet Take 25 mg by mouth daily.    . metoprolol succinate (TOPROL-XL) 50 MG 24 hr tablet TAKE 1 TABLET BY MOUTH DAILY 30 tablet 2  . rosuvastatin (CRESTOR) 20 MG tablet Take 20 mg by mouth daily.    Marland Kitchen sulfaSALAzine (AZULFIDINE) 500 MG EC tablet TAKE 2 TABLETS BY MOUTH TWICE DAILY 360 tablet 0  . terbinafine (LAMISIL) 250 MG tablet Take 250 mg by mouth daily.     No current facility-administered medications on file prior to visit.     ***Review of Systems  Constitutional: Negative for malaise/fatigue and weight loss.  Respiratory: Negative for cough, hemoptysis and shortness of breath.   Cardiovascular: Negative for chest pain, palpitations, claudication and leg swelling.  Gastrointestinal: Negative for abdominal pain, blood in stool, constipation, heartburn and vomiting.  Genitourinary: Negative for dysuria.  Musculoskeletal: Negative for joint pain and myalgias.  Neurological: Negative  for dizziness, focal weakness and headaches.  Endo/Heme/Allergies: Does not bruise/bleed easily.  Psychiatric/Behavioral: Negative for depression. The patient is not nervous/anxious.   All other systems reviewed and are negative.      Objective:  There were no vitals taken for this visit. There is no height or weight  on file to calculate BMI.  ***Physical Exam  Constitutional: He appears well-developed and well-nourished. No distress.  HENT:  Head: Atraumatic.  Eyes: Conjunctivae are normal.  Neck: Neck supple. No JVD present. No thyromegaly present.  Cardiovascular: Normal rate, regular rhythm, normal heart sounds and intact distal pulses. Exam reveals no gallop.  No murmur heard. Pulmonary/Chest: Effort normal and breath sounds normal.  Abdominal: Soft. Bowel sounds are normal.  Musculoskeletal: Normal range of motion.        General: No edema.  Neurological: He is alert.  Skin: Skin is warm and dry.  Psychiatric: He has a normal mood and affect.    CARDIAC STUDIES:   ***  Recent Labs:  ***  Assessment & Recommendations:   There are no diagnoses linked to this encounter.  Recommendation: ***  Adrian Prows, MD, [facility] 06/22/2018, 1:15 PM [facility] [place] Pager: [phone] Office: [phone] If no answer Cell [phone]

## 2018-06-23 ENCOUNTER — Telehealth: Payer: Self-pay | Admitting: Rheumatology

## 2018-06-23 ENCOUNTER — Ambulatory Visit: Payer: Self-pay | Admitting: Cardiology

## 2018-06-23 NOTE — Telephone Encounter (Signed)
Attempted to contact the patient and left message for patient to call the office.  

## 2018-06-23 NOTE — Telephone Encounter (Signed)
Patient called stating he is on antibiotics which he should finish in a couple of days.  Patient is requesting a return call to let him know how long he needs to wait before starting back up on his injections.

## 2018-06-24 NOTE — Telephone Encounter (Signed)
Patient advised not to restart medications until he has completed antibiotics and his leg has healed. Patient advised he would need clearance from the physician.

## 2018-07-01 ENCOUNTER — Encounter: Payer: Self-pay | Admitting: Internal Medicine

## 2018-07-01 ENCOUNTER — Other Ambulatory Visit: Payer: Self-pay

## 2018-07-01 ENCOUNTER — Ambulatory Visit: Payer: BLUE CROSS/BLUE SHIELD | Admitting: Internal Medicine

## 2018-07-01 VITALS — BP 144/92 | HR 60 | Temp 97.8°F | Ht 75.0 in | Wt 253.0 lb

## 2018-07-01 DIAGNOSIS — M0579 Rheumatoid arthritis with rheumatoid factor of multiple sites without organ or systems involvement: Secondary | ICD-10-CM | POA: Diagnosis not present

## 2018-07-01 DIAGNOSIS — L03115 Cellulitis of right lower limb: Secondary | ICD-10-CM

## 2018-07-01 DIAGNOSIS — J449 Chronic obstructive pulmonary disease, unspecified: Secondary | ICD-10-CM | POA: Diagnosis not present

## 2018-07-01 NOTE — Assessment & Plan Note (Signed)
Resolved, letter faxed to rheum and given to pt, no further specific tx or evaluation needed

## 2018-07-01 NOTE — Progress Notes (Signed)
Subjective:    Patient ID: Jeff Russell, male    DOB: 1965/11/21, 53 y.o.   MRN: [id number]  HPI  Here to f/u at urging of rheumatology with respect to f/u right leg cellulitis, with an eye to restarting monthly IV infusion tx for RA.   Pt denies fever, wt loss, night sweats, loss of appetite, or other constitutional symptoms  Right leg erythema and pain resolved.  [place] has some right ankle/foot swelling but not clear if related to right ankle effusion itself.  Has ongoing active pain and swelling to multiple joints mostly to the hands and feet.  Pt denies chest pain, increased sob or doe, wheezing, orthopnea, PND, increased LE swelling, palpitations, dizziness or syncope.  Pt denies new neurological symptoms such as new headache, or facial or extremity weakness or numbness   Pt denies polydipsia, polyuria Past Medical History:  Diagnosis Date  . Allergy   . Arthritis   . COPD (chronic obstructive pulmonary disease) ([place])    per pt  . GERD (gastroesophageal reflux disease)   . History of MRSA infection    right leg per patient  . MSSA (methicillin susceptible Staphylococcus aureus) infection 05/01/2011  . Pneumonia   . Rheumatoid nodules ([place]) 08/06/2014   right lung-per CT scan ordered by Dr Estanislado Pandy  . Septic bursitis 05/01/2011  . Shortness of breath    occ  . SVT (supraventricular tachycardia) ([place])    a. 03/2018 s/p RFCA of 2 concealed accessory pathways (R postlat w/ AVRT @ 280 ms & L postsept w/ AVRT @ 256 ms).   Past Surgical History:  Procedure Laterality Date  . BACK SURGERY    . carpel tunnel Bilateral   . CHOLECYSTECTOMY    . COLONOSCOPY    . EPIDURAL BLOCK INJECTION    . EYE SURGERY Left    wire in eye  . I&D EXTREMITY  03/24/2011   Procedure: IRRIGATION AND DEBRIDEMENT EXTREMITY;  Surgeon: Yvette Rack., MD;  Location: [facility];  Service: Orthopedics;  Laterality: Right;  Irrigation and debridement right knee abcess and Bursa,  application of wound vac  . I&D  EXTREMITY  03/28/2011   Procedure: IRRIGATION AND DEBRIDEMENT EXTREMITY;  Surgeon: Yvette Rack., MD;  Location: [facility];  Service: Orthopedics;  Laterality: Right;  I&D right knee with wound closure  . LEFT HEART CATH AND CORONARY ANGIOGRAPHY N/A 03/16/2018   Procedure: LEFT HEART CATH AND CORONARY ANGIOGRAPHY;  Surgeon: Adrian Prows, MD;  Location: [facility];  Service: Cardiovascular;  Laterality: N/A;  . MENISCUS REPAIR Right 2017  . SVT ABLATION N/A 04/06/2018   Procedure: SVT ABLATION;  Surgeon: Evans Lance, MD;  Location: [facility];  Service: Cardiovascular;  Laterality: N/A;    reports that he has been smoking cigarettes. He has a 60.00 pack-year smoking history. He has never used smokeless tobacco. He reports that he does not drink alcohol or use drugs. family history includes Angina in his maternal grandmother; Arthritis in his maternal grandmother; Colon cancer in his paternal grandfather; Congestive Heart Failure in his paternal grandmother; Heart disease in his mother; Hypercholesterolemia in his mother; Hypertension in his mother; Other in his mother; Prostate cancer in his father. Allergies  Allergen Reactions  . Doxycycline Other (See Comments)    "burns his face" (Sun Sensitivity?)  . Humira [Adalimumab] Swelling and Rash   Current Outpatient Medications on File Prior to Visit  Medication Sig Dispense Refill  . acetaminophen (TYLENOL ARTHRITIS  PAIN) 650 MG CR tablet Take 1,300 mg by mouth 2 (two) times daily.     . ACTEMRA 162 MG/0.9ML SOSY INJECT 162MG  (1 SYRINGE) UNDER THE SKIN EVERY WEEK 12 Syringe 0  . Cholecalciferol (VITAMIN D3) 1000 units CAPS Take 1,000 Units by mouth daily.    . clindamycin (CLEOCIN) 300 MG capsule Take 1 capsule (300 mg total) by mouth 4 (four) times daily. 40 capsule 0  . cyclobenzaprine (FLEXERIL) 10 MG tablet Take 1 tablet (10 mg total) by mouth 3 (three) times daily as needed for muscle spasms. (Patient taking differently: Take  10 mg by mouth at bedtime. ) 90 tablet 0  . diltiazem (CARDIZEM) 30 MG tablet Take 30 mg by mouth daily as needed (Rapid heart rate).   0  . esomeprazole (NEXIUM) 20 MG capsule Take 20 mg by mouth daily before breakfast.    . fluticasone (FLONASE) 50 MCG/ACT nasal spray Place 2 sprays into both nostrils daily as needed for allergies or rhinitis.    Marland Kitchen gabapentin (NEURONTIN) 300 MG capsule TAKE 1 CASPULE BY MOUTH TWICE DAILY 270 capsule 0  . methocarbamol (ROBAXIN) 500 MG tablet TAKE 1 TABLET BY MOUTH 3 TIMES DAILY, DONOT TAKE WITH CYCLOBENZAPRINE 90 tablet 0  . metoprolol succinate (TOPROL-XL) 25 MG 24 hr tablet Take 25 mg by mouth daily.    . metoprolol succinate (TOPROL-XL) 50 MG 24 hr tablet TAKE 1 TABLET BY MOUTH DAILY 30 tablet 2  . rosuvastatin (CRESTOR) 20 MG tablet Take 20 mg by mouth daily.    Marland Kitchen sulfaSALAzine (AZULFIDINE) 500 MG EC tablet TAKE 2 TABLETS BY MOUTH TWICE DAILY 360 tablet 0  . terbinafine (LAMISIL) 250 MG tablet Take 250 mg by mouth daily.     No current facility-administered medications on file prior to visit.    Review of Systems  Constitutional: Negative for other unusual diaphoresis or sweats HENT: Negative for ear discharge or swelling Eyes: Negative for other worsening visual disturbances Respiratory: Negative for stridor or other swelling  Gastrointestinal: Negative for worsening distension or other blood Genitourinary: Negative for retention or other urinary change Musculoskeletal: Negative for other MSK pain or swelling Skin: Negative for color change or other new lesions Neurological: Negative for worsening tremors and other numbness  Psychiatric/Behavioral: Negative for worsening agitation or other fatigue All other system neg per pt    Objective:   Physical Exam BP (!) 144/92   Pulse 60   Temp 97.8 F (36.6 C) (Oral)   Ht 6\' 3"  (1.905 m)   Wt 253 lb (114.8 kg)   SpO2 96%   BMI 31.62 kg/m  VS noted, not ill appearing Constitutional: Pt appears in  NAD HENT: Head: NCAT.  Right Ear: External ear normal.  Left Ear: External ear normal.  Eyes: . Pupils are equal, round, and reactive to light. Conjunctivae and EOM are normal Nose: without d/c or deformity Neck: Neck supple. Gross normal ROM Cardiovascular: Normal rate and regular rhythm.   Pulmonary/Chest: Effort normal and breath sounds without rales or wheezing.  MSK: bilat wrists and hands warm swelling tender without erythema Neurological: Pt is alert. At baseline orientation, motor grossly intact Skin: Skin without significant redness, tender, ulcer, or red streaks. No new lesions, has trace right ankle edema Psychiatric: Pt behavior is normal without agitation  No other exam findings Lab Results  Component Value Date   WBC 7.8 04/27/2018   HGB 15.5 04/27/2018   HCT 45.0 04/27/2018   PLT 214.0 04/27/2018   GLUCOSE 94  04/27/2018   CHOL 110 04/27/2018   TRIG 185.0 (H) 04/27/2018   HDL 31.00 (L) 04/27/2018   LDLDIRECT 58 03/16/2018   LDLCALC 42 04/27/2018   ALT 22 04/27/2018   AST 25 04/27/2018   NA 140 04/27/2018   K 4.8 04/27/2018   CL 104 04/27/2018   CREATININE 1.04 04/27/2018   BUN 16 04/27/2018   CO2 30 04/27/2018   TSH 1.19 04/27/2018   PSA 0.60 04/27/2018   INR 1.08 03/24/2011   HGBA1C 5.0 03/04/2017       Assessment & Plan:

## 2018-07-01 NOTE — Assessment & Plan Note (Signed)
With current mild to mod flare, for rheum f/u as planned

## 2018-07-01 NOTE — Patient Instructions (Signed)
Your leg shows the infection has resolved  As there is no sign of infection even overall, you should be OK to restart the therapy for RA  Please continue all other medications as before, and refills have been done if requested.  Please have the pharmacy call with any other refills you may need.  Please continue your efforts at being more active, low cholesterol diet, and weight control.  Please keep your appointments with your specialists as you may have planned

## 2018-07-01 NOTE — Assessment & Plan Note (Signed)
stable overall by history and exam, recent data reviewed with pt, and pt to continue medical treatment as before,  to f/u any worsening symptoms or concerns  

## 2018-07-05 ENCOUNTER — Other Ambulatory Visit: Payer: Self-pay | Admitting: Rheumatology

## 2018-07-05 NOTE — Telephone Encounter (Signed)
Last Visit: 03/19/18 Next Visit: 08/19/18  Okay to refill per [name]

## 2018-07-07 ENCOUNTER — Telehealth: Payer: Self-pay | Admitting: Rheumatology

## 2018-07-07 NOTE — Telephone Encounter (Signed)
Patient called checking if we received the letter from Dr. Jenny Reichmann, PCP "giving the okay to restart his Actemra injections."   Patient states he is due for an injection today.  Patient requested we call his wife Hassan Rowan and let her know since he will not be home this afternoon.  [phone]

## 2018-07-07 NOTE — Telephone Encounter (Signed)
We received clearance letter from Dr. Jenny Reichmann on 07/01/2018 and [name] is aware. Patient can resume medication. Advised patient's wife he can resume only if he is not showing any signs/symptoms of infection or sickness. She verbalized understanding.

## 2018-08-02 ENCOUNTER — Encounter: Payer: Self-pay | Admitting: Cardiology

## 2018-08-03 ENCOUNTER — Encounter: Payer: Self-pay | Admitting: Cardiology

## 2018-08-03 ENCOUNTER — Other Ambulatory Visit: Payer: Self-pay

## 2018-08-03 ENCOUNTER — Ambulatory Visit: Payer: BLUE CROSS/BLUE SHIELD | Admitting: Cardiology

## 2018-08-03 VITALS — BP 110/73 | Ht 75.0 in | Wt 250.0 lb

## 2018-08-03 DIAGNOSIS — E782 Mixed hyperlipidemia: Secondary | ICD-10-CM | POA: Diagnosis not present

## 2018-08-03 DIAGNOSIS — I209 Angina pectoris, unspecified: Secondary | ICD-10-CM | POA: Insufficient documentation

## 2018-08-03 DIAGNOSIS — F17210 Nicotine dependence, cigarettes, uncomplicated: Secondary | ICD-10-CM | POA: Diagnosis not present

## 2018-08-03 DIAGNOSIS — Z9889 Other specified postprocedural states: Secondary | ICD-10-CM | POA: Diagnosis not present

## 2018-08-03 DIAGNOSIS — M0579 Rheumatoid arthritis with rheumatoid factor of multiple sites without organ or systems involvement: Secondary | ICD-10-CM | POA: Diagnosis not present

## 2018-08-03 DIAGNOSIS — Z8679 Personal history of other diseases of the circulatory system: Secondary | ICD-10-CM | POA: Insufficient documentation

## 2018-08-03 NOTE — Progress Notes (Signed)
Virtual Visit via Telephone Note: Patient unable to use video assisted device.  This visit type was conducted due to national recommendations for restrictions regarding the COVID-19 Pandemic (e.g. social distancing).  This format is felt to be most appropriate for this patient at this time.  All issues noted in this document were discussed and addressed.  No physical exam was performed.  The patient has consented to conduct a Telehealth visit and understands insurance will be billed.   I connected with@, on 08/03/18 at  by TELEPHONE and verified that I am speaking with the correct person using two identifiers.   I discussed the limitations of evaluation and management by telemedicine and the availability of in person appointments. The patient expressed understanding and agreed to proceed.   I have discussed with patient regarding the safety during COVID Pandemic and steps and precautions to be taken including social distancing, frequent hand wash and use of detergent soap, gels with the patient. I asked the patient to avoid touching mouth, nose, eyes, ears with the hands. I encouraged regular walking around the neighborhood and exercise and regular diet, as long as social distancing can be maintained.  Primary Physician/Referring:  Biagio Borg, MD  Patient ID: Jeff Russell, male    DOB: Jul 20, 1965, 53 y.o.   MRN: [id number]  No chief complaint on file.   HPI: Jeff Russell  is a 53 y.o. male  with  seropositive rheumatoid arthritis and osteoarthritis, chronic low back pain and sciatica, spondylolisthesis of lumbar region, mild obesity, multiple pulmonary nodules, tobacco use disorder, COPD, with frequent episodes of PSVT with abberancey, frequent PVCs, and episodes of NSVT recently noted on event monitor. Due to intermediate stress test and frequents PSVT and NSVT, normal coronary arteries on 03/16/18 and underwent complex PSVT ablation on 04/06/2018 by Cristopher Peru, MD and now presents for  f/u.   Patient does not have video-assisted device hence telephone only encounter.  States that since PSVT ablation, he has not had any recurrence of palpitations, he is also not had any further chest pain and feels well.  Courser continues to smoke.  Except for arthritis that is diffuse and back pain, he has no other specific complaints today.  Corrow smoking one to 2 packs of cigarettes a day.  Denies any significant recent weight changes.  No bowel or bladder disturbances.  Past Medical History:  Diagnosis Date   Allergy    Arthritis    COPD (chronic obstructive pulmonary disease) ([place])    per pt   GERD (gastroesophageal reflux disease)    History of MRSA infection    right leg per patient   History of radiofrequency ablation (RFA) procedure for cardiac arrhythmia- Right and Left atrial pathway 04/06/2018   MSSA (methicillin susceptible Staphylococcus aureus) infection 05/01/2011   Pneumonia    Rheumatoid nodules ([place]) 08/06/2014   right lung-per CT scan ordered by Dr Estanislado Pandy   Septic bursitis 05/01/2011   Shortness of breath    occ   SVT (supraventricular tachycardia) ([place])    a. 03/2018 s/p RFCA of 2 concealed accessory pathways (R postlat w/ AVRT @ 280 ms & L postsept w/ AVRT @ 256 ms).    Past Surgical History:  Procedure Laterality Date   BACK SURGERY     carpel tunnel Bilateral    CHOLECYSTECTOMY     COLONOSCOPY     EPIDURAL BLOCK INJECTION     EYE SURGERY Left    wire in eye   I&D EXTREMITY  03/24/2011   Procedure: IRRIGATION AND DEBRIDEMENT EXTREMITY;  Surgeon: Yvette Rack., MD;  Location: [facility];  Service: Orthopedics;  Laterality: Right;  Irrigation and debridement right knee abcess and Bursa,  application of wound vac   I&D EXTREMITY  03/28/2011   Procedure: IRRIGATION AND DEBRIDEMENT EXTREMITY;  Surgeon: Yvette Rack., MD;  Location: [facility];  Service: Orthopedics;  Laterality: Right;  I&D right knee with wound closure   LEFT HEART CATH AND  CORONARY ANGIOGRAPHY N/A 03/16/2018   Procedure: LEFT HEART CATH AND CORONARY ANGIOGRAPHY;  Surgeon: Adrian Prows, MD;  Location: [facility];  Service: Cardiovascular;  Laterality: N/A;   MENISCUS REPAIR Right 2017   SVT ABLATION N/A 04/06/2018   Procedure: SVT ABLATION;  Surgeon: Evans Lance, MD;  Location: [facility];  Service: Cardiovascular;  Laterality: N/A;    Social History   Socioeconomic History   Marital status: Married    Spouse name: Not on file   Number of children: 2   Years of education: Not on file   Highest education level: Not on file  Occupational History   Not on file  Social Needs   Financial resource strain: Not on file   Food insecurity:    Worry: Not on file    Inability: Not on file   Transportation needs:    Medical: Not on file    Non-medical: Not on file  Tobacco Use   Smoking status: Current Every Day Smoker    Packs/day: 2.00    Years: 30.00    Pack years: 60.00    Types: Cigarettes   Smokeless tobacco: Never Used  Substance and Sexual Activity   Alcohol use: No    Comment: quit 20 yrs ago   Drug use: No   Sexual activity: Yes  Lifestyle   Physical activity:    Days per week: Not on file    Minutes per session: Not on file   Stress: Not on file  Relationships   Social connections:    Talks on phone: Not on file    Gets together: Not on file    Attends religious service: Not on file    Active member of club or organization: Not on file    Attends meetings of clubs or organizations: Not on file    Relationship status: Not on file   Intimate partner violence:    Fear of current or ex partner: Not on file    Emotionally abused: Not on file    Physically abused: Not on file    Forced sexual activity: Not on file  Other Topics Concern   Not on file  Social History Narrative   Work or School: lawn care      Home Situation: lives with wife whom sees me as well       Spiritual Beliefs: believes in  God; does not go to church      Lifestyle: no CV exercise - but physically active at work; diet is poor       Current Outpatient Medications on File Prior to Visit  Medication Sig Dispense Refill   acetaminophen (TYLENOL ARTHRITIS PAIN) 650 MG CR tablet Take 1,300 mg by mouth 2 (two) times daily.      ACTEMRA 162 MG/0.9ML SOSY INJECT 162MG  (1 SYRINGE) UNDER THE SKIN EVERY WEEK 12 Syringe 0   Cholecalciferol (VITAMIN D3) 1000 units CAPS Take 1,000 Units by mouth daily.     cyclobenzaprine (FLEXERIL) 10 MG  tablet Take 1 tablet (10 mg total) by mouth 3 (three) times daily as needed for muscle spasms. (Patient taking differently: Take 10 mg by mouth at bedtime. ) 90 tablet 0   esomeprazole (NEXIUM) 20 MG capsule Take 20 mg by mouth daily before breakfast.     fluticasone (FLONASE) 50 MCG/ACT nasal spray Place 2 sprays into both nostrils daily as needed for allergies or rhinitis.     gabapentin (NEURONTIN) 300 MG capsule TAKE 1 CAPSULE BY MOUTH TWICE DAILY 270 capsule 0   methocarbamol (ROBAXIN) 500 MG tablet TAKE 1 TABLET BY MOUTH 3 TIMES DAILY, DONOT TAKE WITH CYCLOBENZAPRINE 90 tablet 0   rosuvastatin (CRESTOR) 20 MG tablet Take 20 mg by mouth daily.     sulfaSALAzine (AZULFIDINE) 500 MG EC tablet TAKE 2 TABLETS BY MOUTH TWICE DAILY 360 tablet 0   diltiazem (CARDIZEM) 30 MG tablet Take 30 mg by mouth daily as needed (Rapid heart rate).   0   metoprolol succinate (TOPROL-XL) 25 MG 24 hr tablet Take 25 mg by mouth daily.     metoprolol succinate (TOPROL-XL) 50 MG 24 hr tablet TAKE 1 TABLET BY MOUTH DAILY (Patient not taking: Reported on 08/03/2018) 30 tablet 2   No current facility-administered medications on file prior to visit.     Review of Systems  Constitution: Negative for chills, decreased appetite, malaise/fatigue and weight gain.  Cardiovascular: Negative for chest pain, dyspnea on exertion, leg swelling and syncope.  Respiratory: Negative for shortness of breath.     Endocrine: Negative for cold intolerance.  Hematologic/Lymphatic: Does not bruise/bleed easily.  Musculoskeletal: Positive for arthritis (rheumatoid arthritis multiple joints) and back pain. Negative for joint swelling.  Gastrointestinal: Negative for abdominal pain, anorexia and change in bowel habit.  Neurological: Negative for headaches and light-headedness.  Psychiatric/Behavioral: Negative for depression and substance abuse.  All other systems reviewed and are negative.     Objective  Blood pressure 110/73, height 6\' 3"  (1.905 m), weight 250 lb (113.4 kg). Body mass index is 31.25 kg/m.  Physical exam not performed or limited due to virtual visit. Please see exam details from prior visit is as below.     Physical Exam  Constitutional: He appears well-developed and well-nourished. No distress.  Mildly obese  HENT:  Head: Atraumatic.  Eyes: Conjunctivae are normal.  Neck: Neck supple. No JVD present. No thyromegaly present.  Cardiovascular: Normal rate, regular rhythm, normal heart sounds and intact distal pulses. Exam reveals no gallop.  No murmur heard. Pulmonary/Chest: Effort normal and breath sounds normal.  Abdominal: Soft. Bowel sounds are normal.  Musculoskeletal: Normal range of motion.        General: No edema.  Neurological: He is alert.  Skin: Skin is warm and dry.  Psychiatric: He has a normal mood and affect.   Radiology: No results found.  Laboratory examination:    CMP Latest Ref Rng & Units 04/27/2018 03/24/2018 03/16/2018  Glucose 70 - 99 mg/dL 94 80 -  BUN 6 - 23 mg/dL 16 11 -  Creatinine 0.40 - 1.50 mg/dL 1.04 1.00 -  Sodium 135 - 145 mEq/L 140 142 -  Potassium 3.5 - 5.1 mEq/L 4.8 5.5(H) 3.5  Chloride 96 - 112 mEq/L 104 102 -  CO2 19 - 32 mEq/L 30 27 -  Calcium 8.4 - 10.5 mg/dL 9.9 10.1 -  Total Protein 6.0 - 8.3 g/dL 7.0 - -  Total Bilirubin 0.2 - 1.2 mg/dL 0.6 - -  Alkaline Phos 39 - 117 U/L 77 - -  AST 0 - 37 U/L 25 - -  ALT 0 - 53 U/L 22 -  -   CBC Latest Ref Rng & Units 04/27/2018 03/24/2018 01/22/2018  WBC 4.0 - 10.5 K/uL 7.8 8.5 6.3  Hemoglobin 13.0 - 17.0 g/dL 15.5 15.2 15.7  Hematocrit 39.0 - 52.0 % 45.0 44.0 44.6  Platelets 150.0 - 400.0 K/uL 214.0 212 192   Lipid Panel     Component Value Date/Time   CHOL 110 04/27/2018 0911   TRIG 185.0 (H) 04/27/2018 0911   HDL 31.00 (L) 04/27/2018 0911   CHOLHDL 4 04/27/2018 0911   VLDL 37.0 04/27/2018 0911   LDLCALC 42 04/27/2018 0911   LDLDIRECT 58 03/16/2018 0613   HEMOGLOBIN A1C Lab Results  Component Value Date   HGBA1C 5.0 03/04/2017   TSH Recent Labs    04/27/18 0911  TSH 1.19    Cardiac Studies:    Event Monitor 30 days 03/02/2018: Paroxysmal episodes of symptomatic SVT with aberrancy, maximum V-rate 210 bpm. day 6 on 03/07/2018:4 beat run of NSVT and 9 beat run of NSVT. PVCs. Symptomatic with palpitations different from SVT. Occasional PVCs. Symptomatic with palpitations different from SVT: One episode of 4 beat run of ventricular tachycardia and 9 beats run of ventricular tachycardia noted at 05:50 p.m.   Coronary angiogram 03/18/2018: Normal coronary arteries.  Exercise sestamibi stress test 02/17/2018: 1. The resting electrocardiogram demonstrated normal sinus rhythm and incomplete RBBB, occasional PAC and PVC. During stress there were frequent PVC and also V-Bigeminy, which decreased as exercise progressed. There were frequent PAC as well. No ST-T changes of ischemia. Patient exercised on Bruce protocol for 6:30 minutes and achieved 7.74 METS. Stress test terminated due to dyspnea and 89% MPHR achieved (Target HR >85%). 2. Perfusion imaging study reveals antero-septal thinning without ischemia or scar. There is moderate sized mild ischemia in the inferior and lateral ischemia at the base and mid ventricle. LVEF calculated at 39% with inferior hypokinesis. Intermediate risk.  Echocardiogram 03/02/2018: Left ventricle cavity is normal in size. Mild  concentric hypertrophy of the left ventricle. Low normal decrease in global wall motion. Visual EF is 50-55%. Doppler evidence of grade I (impaired) diastolic dysfunction, normal LAP. Left ventricle regional wall motion findings: No wall motion abnormalities. Calculated EF 45%. Left atrial cavity is moderately dilated. No hemodynamically significant valvular abnormality. The aortic root is mildly dilated at 4.0 cm, probably normal for patient's body habitus. IVC is dilated with blunted respiratory response.   Assessment   History of radiofrequency ablation (RFA) procedure for cardiac arrhythmia- Right and Left atrial pathway  Mixed hyperlipidemia  Cigarette smoker  Rheumatoid arthritis involving multiple sites with positive rheumatoid factor ([place])  EKG 02/11/2018: Normal sinus rhythm at the rate of 78 bpm, normal axis, single PVC. Otherwise normal EKG.   Recommendations:   Patient who previously had exertional chest pain and frequent episodes of palpitations, symptoms are completely subsided since PSVT ablation in December 2020.  He does have hyperlipidemia and he has been taking Crestor and is tolerating this well.  Smoking cessation was discussed with the patient in detail. In view of underlying rheumatoid arthritis, high risk for future cardiovascular events was discussed with the patient.  Weight loss was also addressed.  As he remains stable from cardiac standpoint, I'll see him back on a p.r.n. basis.  He'll follow-up with Dr. Cathlean Cower for further management and evaluation including prescription refills.  Elevated triglycerides are related to his diet, LDL is at goal.  With regard  to aspirin there is no indication, advised him to discontinue this. He has normal coronary arteries by coronary angiography.  Adrian Prows, MD, [facility] 08/03/2018, [facility]. [place] Pager: [phone] Office: [phone] If no answer Cell [phone]

## 2018-08-04 NOTE — Progress Notes (Signed)
Virtual Visit via Telephone Note  I connected with Jeff Russell on 08/12/18 at  8:15 AM EDT by telephone and verified that I am speaking with the correct person using two identifiers.   I discussed the limitations, risks, security and privacy concerns of performing an evaluation and management service by telephone and the availability of in person appointments. I also discussed with the patient that there may be a patient responsible charge related to this service. The patient expressed understanding and agreed to proceed. This service was conducted via virtual visit. He was unable to be reached by Webex, but he was reached by telephone.   The patient was located at home. I was located in my office.  Consent was obtained prior to the virtual visit and is aware of possible charges through their insurance for this visit.  The patient is an established patient.  [name], MD conducted the virtual visit and [name], PA-C acted as scribe during the service.  Office staff helped with scheduling follow up visits after the service was conducted.    CC: Pain in both hands  History of Present Illness: Patient is a 53 year old male with a past medical history of seropositive rheumatoid arthritis and osteoarthritis.  He is on Actemra 162 mg sq weekly injections and SSZ 1000 mg BID.  He has no missed any doses recently. He is currently having pain in both hands and both feet.  He is having hand swelling.  He is able to make a complete fist. He would like a prednisone taper.   Review of Systems  Constitutional: Positive for malaise/fatigue. Negative for fever.  Eyes: Negative for photophobia, pain, discharge and redness.  Respiratory: Negative for cough, shortness of breath and wheezing.   Cardiovascular: Negative for chest pain and palpitations.  Gastrointestinal: Negative for blood in stool, constipation and diarrhea.  Genitourinary: Negative for dysuria.  Musculoskeletal: Positive for joint  pain. Negative for back pain, myalgias and neck pain.       +Morning stiffness +Joint swelling  Skin: Negative for rash.  Neurological: Negative for dizziness and headaches.  Psychiatric/Behavioral: Negative for depression. The patient is not nervous/anxious and does not have insomnia.       Observations/Objective: Physical Exam  Constitutional: He is oriented to person, place, and time.  Neurological: He is alert and oriented to person, place, and time.  Psychiatric: Mood, memory, affect and judgment normal.   Patient reports morning stiffness for 2 minutes.   Patient denies nocturnal pain.  Difficulty dressing/grooming: Denies Difficulty climbing stairs: Denies Difficulty getting out of chair: Reports Difficulty using hands for taps, buttons, cutlery, and/or writing: Denies  Assessment and Plan: Rheumatoid arthritis involving multiple sites with positive rheumatoid factor ([place]):  He is currently having a rheumatoid arthritis flare.  He is having increased pain in both hands and both feet.  He has joint swelling in both hands.  He is able to make a complete fist.  He is on Actemra 162 mg sq weekly injections and Sulfasalazine 500 mg 2 tablets by mouth twice daily.  He has no missed any doses.  He does not want to make any changes at this time. He requested a prednisone taper to be send to the pharmacy.  He will start taking 20 mg po daily and reduce by 5 mg every 4 days.  He was advised to notify us if he develops increased joint pain and swelling after completing the taper.  He will follow up in 3 months.  High risk medication use- Actemra 162 mg weekly and sulfasalazine 1000 mg twice daily.  Last TB gold negative on 04/15/2017.   Updated TB gold drawn on 08/10/18 is pending. Lipid panel checked on 04/27/18. He continues to have lipid panel checked on a regular basis by [name].  He is on Crestor managed by his PCP. CBC and CMP were drawn on 08/10/18.  He will be due to update labs in August  and every 3 months.   He was advised to hold Actemra if he develops an infection and to resume once the infection has cleared.  We discussed the importance of social distancing and following the standard precautions recommended by the CDC.    Primary osteoarthritis of both knees: He continues to have intermittent pain in both knee joints.  He has no joint swelling.  He has some difficulty getting up from a chair and climbing ladders.   Plantar fasciitis of left foot: Resolved.   Spondylolisthesis of lumbar region: He has limited ROM of the lumbar spine.  He has intermittent discomfort.     Osteopenia of multiple sites: He takes a vitamin D supplement on a daily basis.   Follow Up Instructions: He will follow up in 3 months.  A prednisone taper will be sent to the pharmacy.   I discussed the assessment and treatment plan with the patient. The patient was provided an opportunity to ask questions and all were answered. The patient agreed with the plan and demonstrated an understanding of the instructions.   The patient was advised to call back or seek an in-person evaluation if the symptoms worsen or if the condition fails to improve as anticipated.  I provided 25 minutes of non-face-to-face time during this encounter. [name], MD   Scribed by-  Ofilia Neas, PA-C

## 2018-08-06 ENCOUNTER — Other Ambulatory Visit: Payer: Self-pay | Admitting: Rheumatology

## 2018-08-06 DIAGNOSIS — Z79899 Other long term (current) drug therapy: Secondary | ICD-10-CM

## 2018-08-06 NOTE — Telephone Encounter (Signed)
Last Visit: 03/19/18 Next Visit: 08/12/18 Labs: 04/27/18 Stable  Patient's wife advised he is due to update labs. Will advise patient. Lab Orders released.   Okay to refill per [name]

## 2018-08-10 ENCOUNTER — Telehealth: Payer: Self-pay | Admitting: Rheumatology

## 2018-08-10 DIAGNOSIS — Z79899 Other long term (current) drug therapy: Secondary | ICD-10-CM

## 2018-08-10 DIAGNOSIS — Z9225 Personal history of immunosupression therapy: Secondary | ICD-10-CM | POA: Diagnosis not present

## 2018-08-10 NOTE — Telephone Encounter (Signed)
Patient at [facility] now for labs. Please release orders. °

## 2018-08-10 NOTE — Telephone Encounter (Signed)
Labs have been released for [facility].

## 2018-08-11 NOTE — Telephone Encounter (Signed)
stable °

## 2018-08-12 ENCOUNTER — Telehealth (INDEPENDENT_AMBULATORY_CARE_PROVIDER_SITE_OTHER): Payer: BLUE CROSS/BLUE SHIELD | Admitting: Rheumatology

## 2018-08-12 ENCOUNTER — Encounter: Payer: Self-pay | Admitting: Rheumatology

## 2018-08-12 ENCOUNTER — Other Ambulatory Visit: Payer: Self-pay

## 2018-08-12 DIAGNOSIS — R002 Palpitations: Secondary | ICD-10-CM

## 2018-08-12 DIAGNOSIS — F17200 Nicotine dependence, unspecified, uncomplicated: Secondary | ICD-10-CM

## 2018-08-12 DIAGNOSIS — M8589 Other specified disorders of bone density and structure, multiple sites: Secondary | ICD-10-CM

## 2018-08-12 DIAGNOSIS — Z8709 Personal history of other diseases of the respiratory system: Secondary | ICD-10-CM

## 2018-08-12 DIAGNOSIS — Z8614 Personal history of Methicillin resistant Staphylococcus aureus infection: Secondary | ICD-10-CM

## 2018-08-12 DIAGNOSIS — M722 Plantar fascial fibromatosis: Secondary | ICD-10-CM | POA: Diagnosis not present

## 2018-08-12 DIAGNOSIS — M4316 Spondylolisthesis, lumbar region: Secondary | ICD-10-CM

## 2018-08-12 DIAGNOSIS — Z79899 Other long term (current) drug therapy: Secondary | ICD-10-CM

## 2018-08-12 DIAGNOSIS — Z8781 Personal history of (healed) traumatic fracture: Secondary | ICD-10-CM

## 2018-08-12 DIAGNOSIS — M170 Bilateral primary osteoarthritis of knee: Secondary | ICD-10-CM | POA: Diagnosis not present

## 2018-08-12 DIAGNOSIS — M0579 Rheumatoid arthritis with rheumatoid factor of multiple sites without organ or systems involvement: Secondary | ICD-10-CM

## 2018-08-12 DIAGNOSIS — R918 Other nonspecific abnormal finding of lung field: Secondary | ICD-10-CM

## 2018-08-12 LAB — CBC WITH DIFFERENTIAL/PLATELET
Absolute Monocytes: 491 cells/uL (ref 200–950)
Basophils Absolute: 38 cells/uL (ref 0–200)
Basophils Relative: 0.7 %
Eosinophils Absolute: 200 cells/uL (ref 15–500)
Eosinophils Relative: 3.7 %
HCT: 43.5 % (ref 38.5–50.0)
Hemoglobin: 15 g/dL (ref 13.2–17.1)
Lymphs Abs: 2095 cells/uL (ref 850–3900)
MCH: 35 pg — ABNORMAL HIGH (ref 27.0–33.0)
MCHC: 34.5 g/dL (ref 32.0–36.0)
MCV: 101.4 fL — ABNORMAL HIGH (ref 80.0–100.0)
MPV: 10.7 fL (ref 7.5–12.5)
Monocytes Relative: 9.1 %
Neutro Abs: 2576 cells/uL (ref 1500–7800)
Neutrophils Relative %: 47.7 %
Platelets: 149 10*3/uL (ref 140–400)
RBC: 4.29 10*6/uL (ref 4.20–5.80)
RDW: 11.5 % (ref 11.0–15.0)
Total Lymphocyte: 38.8 %
WBC: 5.4 10*3/uL (ref 3.8–10.8)

## 2018-08-12 LAB — QUANTIFERON-TB GOLD PLUS
Mitogen-NIL: 8.56 IU/mL
NIL: 0.03 IU/mL
QuantiFERON-TB Gold Plus: NEGATIVE
TB1-NIL: 0.01 IU/mL
TB2-NIL: 0.03 IU/mL

## 2018-08-12 LAB — COMPLETE METABOLIC PANEL WITH GFR
AG Ratio: 2 (calc) (ref 1.0–2.5)
ALT: 33 U/L (ref 9–46)
AST: 33 U/L (ref 10–35)
Albumin: 4.3 g/dL (ref 3.6–5.1)
Alkaline phosphatase (APISO): 69 U/L (ref 35–144)
BUN: 18 mg/dL (ref 7–25)
CO2: 25 mmol/L (ref 20–32)
Calcium: 9.6 mg/dL (ref 8.6–10.3)
Chloride: 109 mmol/L (ref 98–110)
Creat: 0.95 mg/dL (ref 0.70–1.33)
GFR, Est African American: 106 mL/min/{1.73_m2} (ref >=60)
GFR, Est Non African American: 92 mL/min/{1.73_m2} (ref >=60)
Globulin: 2.2 g/dL (calc) (ref 1.9–3.7)
Glucose, Bld: 93 mg/dL (ref 65–139)
Potassium: 4.8 mmol/L (ref 3.5–5.3)
Sodium: 141 mmol/L (ref 135–146)
Total Bilirubin: 0.5 mg/dL (ref 0.2–1.2)
Total Protein: 6.5 g/dL (ref 6.1–8.1)

## 2018-08-12 MED ORDER — PREDNISONE 5 MG PO TABS: ORAL_TABLET | ORAL | 0 refills | Status: DC

## 2018-08-13 NOTE — Telephone Encounter (Signed)
Labs are stable.

## 2018-08-13 NOTE — Telephone Encounter (Signed)
TB gold negative

## 2018-08-17 ENCOUNTER — Telehealth: Payer: Self-pay | Admitting: Rheumatology

## 2018-08-17 MED ORDER — CYCLOBENZAPRINE HCL 10 MG PO TABS: 10.0000 mg | ORAL_TABLET | Freq: Every day | ORAL | 0 refills | Status: DC

## 2018-08-17 NOTE — Telephone Encounter (Signed)
Okay to give 30-day supply.

## 2018-08-17 NOTE — Telephone Encounter (Signed)
Last Visit: 08/12/18 Next Visit due August 2020. Message sent to the front to schedule patient   Patient is requesting a prescription for Flexeril. We have not prescribed this since Jan 2019.  Okay to refill Flexeril?

## 2018-08-17 NOTE — Telephone Encounter (Signed)
Patient left a voicemail requesting prescription refill of Flexeril.

## 2018-08-19 ENCOUNTER — Ambulatory Visit: Payer: BLUE CROSS/BLUE SHIELD | Admitting: Physician Assistant

## 2018-08-20 ENCOUNTER — Telehealth: Payer: Self-pay | Admitting: Rheumatology

## 2018-08-20 NOTE — Telephone Encounter (Signed)
LMOM to call and schedule 3 month follow-up appointment.

## 2018-08-20 NOTE — Telephone Encounter (Signed)
-----   Message from Carole Binning, LPN sent at 5/0/5397  1:58 PM EDT ----- Please schedule patient for a follow up visit in 3 months. Patient was seen for a telemedicine visit. Thanks!

## 2018-08-24 ENCOUNTER — Telehealth: Payer: Self-pay | Admitting: Rheumatology

## 2018-08-24 NOTE — Telephone Encounter (Signed)
I LMOM for patient to call, and schedule his next follow up appt with [name], PAC around 11/16/2018.

## 2018-08-24 NOTE — Telephone Encounter (Signed)
-----   Message from Carole Binning, LPN sent at 06/10/8597  1:58 PM EDT ----- Please schedule patient for a follow up visit in 3 months. Patient was seen for a telemedicine visit. Thanks!

## 2018-08-31 ENCOUNTER — Other Ambulatory Visit: Payer: Self-pay | Admitting: Rheumatology

## 2018-08-31 NOTE — Telephone Encounter (Signed)
Last Visit: 08/12/18 Next Visit: 12/08/18 Labs: 08/10/18 stable TB Gold: 08/10/18 neg   Okay to refill per [name]

## 2018-09-07 ENCOUNTER — Telehealth: Payer: Self-pay | Admitting: *Deleted

## 2018-09-07 ENCOUNTER — Other Ambulatory Visit: Payer: Self-pay | Admitting: Rheumatology

## 2018-09-07 NOTE — Telephone Encounter (Signed)
Please call in Actemra to [facility]. Patient checked at Pharmacy. Patient's contact [phone]

## 2018-09-07 NOTE — Telephone Encounter (Signed)
Patient advised prescription was sent to the pharmacy on 08/31/18. Advised showed on our end the prescription was received at the pharmacy. Spoke with the pharmacist Cristie Hem who said they did not receive the prescription. Gave verbal authorization for Actemra to be refilled.    Last Visit: 08/12/18 Next Visit: 12/08/18 Labs: 08/10/18 stable TB Gold: 08/10/18 neg   Okay to refill per [name]

## 2018-09-14 ENCOUNTER — Other Ambulatory Visit: Payer: Self-pay | Admitting: Rheumatology

## 2018-09-14 NOTE — Telephone Encounter (Signed)
Last Visit: 08/12/2018 Next Visit: 12/08/2018  Last fill: 08/17/2018 you approved a 30 day supply.   Okay to refill flexeril?

## 2018-09-14 NOTE — Telephone Encounter (Signed)
ok 

## 2018-09-20 DIAGNOSIS — B356 Tinea cruris: Secondary | ICD-10-CM | POA: Diagnosis not present

## 2018-09-20 DIAGNOSIS — B353 Tinea pedis: Secondary | ICD-10-CM | POA: Diagnosis not present

## 2018-09-20 DIAGNOSIS — B354 Tinea corporis: Secondary | ICD-10-CM | POA: Diagnosis not present

## 2018-09-20 DIAGNOSIS — B352 Tinea manuum: Secondary | ICD-10-CM | POA: Diagnosis not present

## 2018-09-21 IMAGING — RF DG C-ARM 61-120 MIN
1 series · 2 of 2 positions shown · non-contrast
Comparison: MRI 12/31/2015

CLINICAL DATA: Lumbar decompression and fusion.

EXAM:
DG C-ARM 61-120 MIN; LUMBAR SPINE - 2-3 VIEW

[Series 1: run · 2 of 2 slices shown]
[im 1/2]
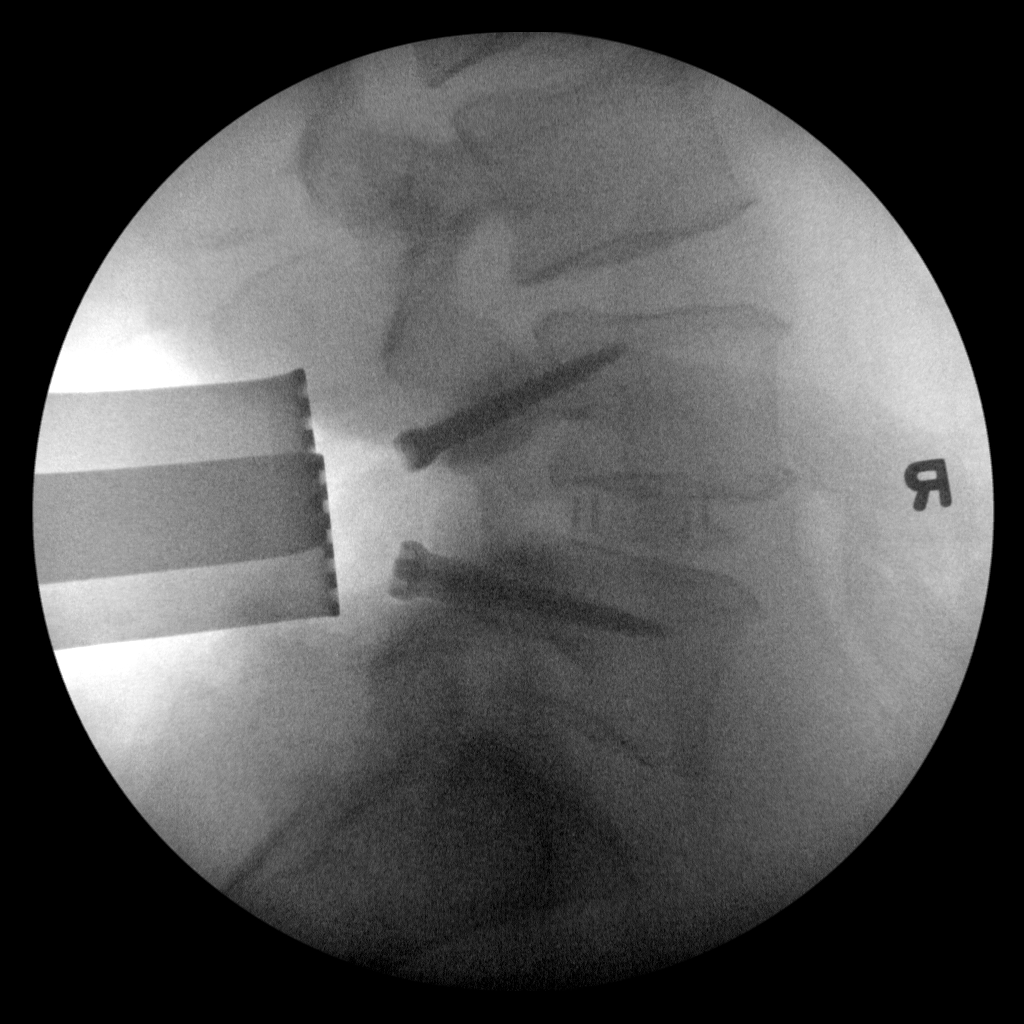
[im 2/2]
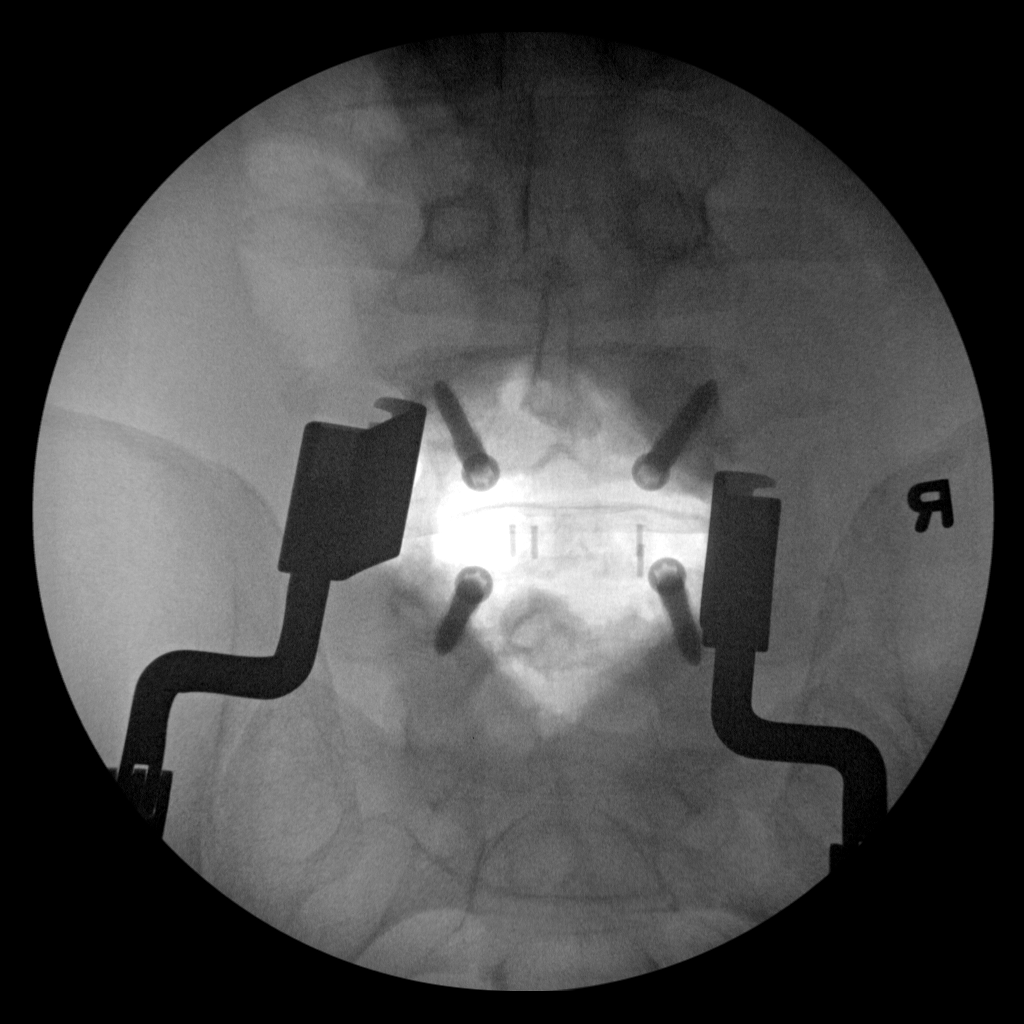

[2 of 2 positions shown; findings below may reference images not displayed]

FINDINGS: Two C-arm images show decompression, diskectomy and fusion at L4-5.
Interbody fusion material is in place. There are bilateral L4 and L5
pedicle screws. Posterior rods not yet in position.
IMPRESSION: PLIF in progress at L4-5.

## 2018-10-04 DIAGNOSIS — Z79899 Other long term (current) drug therapy: Secondary | ICD-10-CM | POA: Diagnosis not present

## 2018-10-07 ENCOUNTER — Other Ambulatory Visit: Payer: Self-pay | Admitting: Rheumatology

## 2018-10-11 NOTE — Telephone Encounter (Signed)
Last Visit: 08/12/2018 Next Visit: 12/08/2018  Okay to refill per [name]

## 2018-11-08 ENCOUNTER — Other Ambulatory Visit: Payer: Self-pay | Admitting: Rheumatology

## 2018-11-08 NOTE — Telephone Encounter (Signed)
Last Visit: 08/12/2018 telemedicine  Next Visit: 12/08/2018  Okay to refill per [name].

## 2018-11-12 ENCOUNTER — Other Ambulatory Visit: Payer: Self-pay | Admitting: Rheumatology

## 2018-11-12 NOTE — Telephone Encounter (Signed)
Last Visit: 08/12/18 Next Visit: 12/08/18 Labs: 08/10/18 stable  Patient's wife advised patient is due to update labs. She states she will advise patient and have him come Monday morning.   Okay to refill 30 day supply per [name]

## 2018-11-16 DIAGNOSIS — Z79899 Other long term (current) drug therapy: Secondary | ICD-10-CM | POA: Diagnosis not present

## 2018-11-18 LAB — COMPLETE METABOLIC PANEL WITH GFR
AG Ratio: 1.9 (calc) (ref 1.0–2.5)
ALT: 23 U/L (ref 9–46)
AST: 29 U/L (ref 10–35)
Albumin: 4.2 g/dL (ref 3.6–5.1)
Alkaline phosphatase (APISO): 60 U/L (ref 35–144)
BUN: 15 mg/dL (ref 7–25)
CO2: 27 mmol/L (ref 20–32)
Calcium: 9.5 mg/dL (ref 8.6–10.3)
Chloride: 106 mmol/L (ref 98–110)
Creat: 1.1 mg/dL (ref 0.70–1.33)
GFR, Est African American: 88 mL/min/{1.73_m2} (ref >=60)
GFR, Est Non African American: 76 mL/min/{1.73_m2} (ref >=60)
Globulin: 2.2 g/dL (calc) (ref 1.9–3.7)
Glucose, Bld: 87 mg/dL (ref 65–99)
Potassium: 4.5 mmol/L (ref 3.5–5.3)
Sodium: 139 mmol/L (ref 135–146)
Total Bilirubin: 0.7 mg/dL (ref 0.2–1.2)
Total Protein: 6.4 g/dL (ref 6.1–8.1)

## 2018-11-18 LAB — CBC WITH DIFFERENTIAL/PLATELET
Absolute Monocytes: 389 cells/uL (ref 200–950)
Basophils Absolute: 49 cells/uL (ref 0–200)
Basophils Relative: 0.9 %
Eosinophils Absolute: 227 cells/uL (ref 15–500)
Eosinophils Relative: 4.2 %
HCT: 41.3 % (ref 38.5–50.0)
Hemoglobin: 14.3 g/dL (ref 13.2–17.1)
Lymphs Abs: 1879 cells/uL (ref 850–3900)
MCH: 34.8 pg — ABNORMAL HIGH (ref 27.0–33.0)
MCHC: 34.6 g/dL (ref 32.0–36.0)
MCV: 100.5 fL — ABNORMAL HIGH (ref 80.0–100.0)
MPV: 9.9 fL (ref 7.5–12.5)
Monocytes Relative: 7.2 %
Neutro Abs: 2857 cells/uL (ref 1500–7800)
Neutrophils Relative %: 52.9 %
Platelets: 181 10*3/uL (ref 140–400)
RBC: 4.11 10*6/uL — ABNORMAL LOW (ref 4.20–5.80)
RDW: 12 % (ref 11.0–15.0)
Total Lymphocyte: 34.8 %
WBC: 5.4 10*3/uL (ref 3.8–10.8)

## 2018-11-18 LAB — QUANTIFERON-TB GOLD PLUS
Mitogen-NIL: >10 IU/mL
NIL: 0.03 IU/mL
QuantiFERON-TB Gold Plus: NEGATIVE
TB1-NIL: 0.01 IU/mL
TB2-NIL: 0.01 IU/mL

## 2018-11-19 NOTE — Telephone Encounter (Signed)
Labs are stable.

## 2018-11-24 NOTE — Progress Notes (Signed)
Office Visit Note  Patient: Jeff Russell             Date of Birth: 1965-04-21           MRN: [id number]             PCP: Biagio Borg, MD Referring: Biagio Borg, MD Visit Date: 12/08/2018 Occupation: @GUAROCC @  Subjective:  Right shoulder joint pain   History of Present Illness: Jeff Russell is a 53 y.o. male with history of seropositive rheumatoid arthritis and osteoarthritis.  He is on Actemra 162 mg sq weekly injections and Sulfasalazine 500 mg 2 tablets BID.  Reports that he has been having increased pain in the right shoulder joint for the past several months.  He denies any recent injuries or overuse activities.  He has been experiencing nocturnal pain.  He states that the pain is progressively been getting worse.  He continues to have pain in bilateral hands and notices swelling at times.  He uses both hands a lot at work on a daily basis.  He also experiences lower back pain especially by the end of the day.  He uses a heating pad which resolves his discomfort by morning.  He denies any other joint pain or joint swelling at this time.  He denies any recent infections.   Activities of Daily Living:  Patient reports morning stiffness for 1 hour.   Patient Reports nocturnal pain.  Difficulty dressing/grooming: Denies Difficulty climbing stairs: Denies Difficulty getting out of chair: Reports Difficulty using hands for taps, buttons, cutlery, and/or writing: Denies  Review of Systems  Constitutional: Positive for fatigue. Negative for night sweats.  HENT: Negative for mouth sores, mouth dryness and nose dryness.   Eyes: Negative for redness and dryness.  Respiratory: Negative for cough, hemoptysis, shortness of breath and difficulty breathing.   Cardiovascular: Negative for chest pain, palpitations, hypertension, irregular heartbeat and swelling in legs/feet.  Gastrointestinal: Negative for constipation and diarrhea.  Endocrine: Negative for increased urination.    Genitourinary: Negative for painful urination.  Musculoskeletal: Positive for arthralgias, joint pain, joint swelling and morning stiffness. Negative for myalgias, muscle weakness, muscle tenderness and myalgias.  Skin: Negative for color change, rash, hair loss, nodules/bumps, skin tightness, ulcers and sensitivity to sunlight.  Allergic/Immunologic: Negative for susceptible to infections.  Neurological: Negative for dizziness, fainting, memory loss, night sweats and weakness.  Hematological: Negative for swollen glands.  Psychiatric/Behavioral: Negative for depressed mood and sleep disturbance. The patient is not nervous/anxious.     PMFS History:  Patient Active Problem List   Diagnosis Date Noted   Mixed hyperlipidemia 08/03/2018   History of PSVT (paroxysmal supraventricular tachycardia) 08/03/2018   Cellulitis of right leg 06/16/2018   Preventative health care 04/27/2018   Vitamin D deficiency 04/27/2018   History of radiofrequency ablation (RFA) procedure for cardiac arrhythmia- Right and Left atrial pathway 04/06/2018   Chest pain on exertion 03/15/2018   Muscle weakness (generalized) 03/06/2017   Weakness 03/04/2017   Gait abnormality 03/04/2017   Low back pain without sciatica 03/04/2017   Weakness of both lower extremities 01/12/2017   Spondylolisthesis of lumbar region 07/16/2016   High risk medication use 07/14/2016   Osteoarthritis of lumbar spine 07/14/2016   Primary osteoarthritis of both knees 07/14/2016   Chondromalacia of patella 07/14/2016   Vertebral compression fracture ([place]) 07/14/2016   Osteopenia of multiple sites 07/14/2016   Plantar fasciitis of left foot 07/14/2016   COPD 0 / [place] smoking 02/28/2016  Multiple pulmonary nodules 11/21/2015   Multiple environmental allergies 05/16/2014   Cigarette smoker 05/16/2014   Rheumatoid arthritis ([place]) 05/01/2011    Past Medical History:  Diagnosis Date   Allergy    Arthritis     COPD (chronic obstructive pulmonary disease) (HCC)    per pt   GERD (gastroesophageal reflux disease)    History of MRSA infection    right leg per patient   History of radiofrequency ablation (RFA) procedure for cardiac arrhythmia- Right and Left atrial pathway 04/06/2018   MSSA (methicillin susceptible Staphylococcus aureus) infection 05/01/2011   Pneumonia    Rheumatoid nodules ([place]) 08/06/2014   right lung-per CT scan ordered by Dr Estanislado Pandy   Septic bursitis 05/01/2011   Shortness of breath    occ   SVT (supraventricular tachycardia) ([place])    a. 03/2018 s/p RFCA of 2 concealed accessory pathways (R postlat w/ AVRT @ 280 ms & L postsept w/ AVRT @ 256 ms).    Family History  Problem Relation Age of Onset   Arthritis Maternal Grandmother    Angina Maternal Grandmother    Colon cancer Paternal Grandfather        deceased   Prostate cancer Father    Hypercholesterolemia Mother    Heart disease Mother        palpitations, ?heart failure   Hypertension Mother    Other Mother        Heart enlargement,left ventricle problems   Congestive Heart Failure Paternal Grandmother    Esophageal cancer Neg Hx    Rectal cancer Neg Hx    Stomach cancer Neg Hx    Past Surgical History:  Procedure Laterality Date   BACK SURGERY     carpel tunnel Bilateral    CHOLECYSTECTOMY     COLONOSCOPY     EPIDURAL BLOCK INJECTION     EYE SURGERY Left    wire in eye   I&D EXTREMITY  03/24/2011   Procedure: IRRIGATION AND DEBRIDEMENT EXTREMITY;  Surgeon: Yvette Rack., MD;  Location: [facility];  Service: Orthopedics;  Laterality: Right;  Irrigation and debridement right knee abcess and Bursa,  application of wound vac   I&D EXTREMITY  03/28/2011   Procedure: IRRIGATION AND DEBRIDEMENT EXTREMITY;  Surgeon: Yvette Rack., MD;  Location: [facility];  Service: Orthopedics;  Laterality: Right;  I&D right knee with wound closure   LEFT HEART CATH AND CORONARY ANGIOGRAPHY N/A  03/16/2018   Procedure: LEFT HEART CATH AND CORONARY ANGIOGRAPHY;  Surgeon: Adrian Prows, MD;  Location: [facility];  Service: Cardiovascular;  Laterality: N/A;   MENISCUS REPAIR Right 2017   SVT ABLATION N/A 04/06/2018   Procedure: SVT ABLATION;  Surgeon: Evans Lance, MD;  Location: [facility];  Service: Cardiovascular;  Laterality: N/A;   Social History   Social History Narrative   Work or School: Art gallery manager Situation: lives with wife whom sees me as well       Spiritual Beliefs: believes in God; does not go to church      Lifestyle: no CV exercise - but physically active at work; diet is poor      Immunization History  Administered Date(s) Administered   Influenza,inj,Quad PF,6+ Mos 02/26/2016, 04/07/2018   Influenza-Unspecified 04/07/2014   Tdap 11/06/2011     Objective: Vital Signs: BP 121/75 (BP Location: Left Arm, Patient Position: Sitting, Cuff Size: Normal)    Pulse 100    Resp 15  Ht 6\' 3"  (1.905 m)    Wt 250 lb (113.4 kg)    BMI 31.25 kg/m    Physical Exam Vitals signs and nursing note reviewed.  Constitutional:      Appearance: He is well-developed.  HENT:     Head: Normocephalic and atraumatic.  Eyes:     Conjunctiva/sclera: Conjunctivae normal.     Pupils: Pupils are equal, round, and reactive to light.  Neck:     Musculoskeletal: Normal range of motion and neck supple.  Cardiovascular:     Rate and Rhythm: Normal rate and regular rhythm.     Heart sounds: Normal heart sounds.  Pulmonary:     Effort: Pulmonary effort is normal.     Breath sounds: Normal breath sounds.  Abdominal:     General: Bowel sounds are normal.     Palpations: Abdomen is soft.  Skin:    General: Skin is warm and dry.     Capillary Refill: Capillary refill takes less than 2 seconds.  Neurological:     Mental Status: He is alert and oriented to person, place, and time.  Psychiatric:        Behavior: Behavior normal.      Musculoskeletal  Exam: C-spine limited range of motion.  Thoracic kyphosis noted.  Limited range of motion of lumbar spine.  Right shoulder has full range of motion with discomfort.  Left shoulder full range of motion with no discomfort.  Evidence, restraints, CBC and PRBC.  He is good range of motion no synovitis.  He has synovial thickening of MCP joints but no synovitis noted.  He has PIP and DIP synovial thickening.  Hip joints have good range of motion with no discomfort.  Knee joints have good range of motion no warmth or effusion.  No tenderness or swelling of ankle joints.  CDAI Exam: CDAI Score: 1.2  Patient Global: 6 mm; Provider Global: 6 mm Swollen: 0 ; Tender: 0  Joint Exam   No joint exam has been documented for this visit   There is currently no information documented on the homunculus. Go to the Rheumatology activity and complete the homunculus joint exam.  Investigation: No additional findings.  Imaging: No results found.  Recent Labs: Lab Results  Component Value Date   WBC 5.4 11/16/2018   HGB 14.3 11/16/2018   PLT 181 11/16/2018   NA 139 11/16/2018   K 4.5 11/16/2018   CL 106 11/16/2018   CO2 27 11/16/2018   GLUCOSE 87 11/16/2018   BUN 15 11/16/2018   CREATININE 1.10 11/16/2018   BILITOT 0.7 11/16/2018   ALKPHOS 77 04/27/2018   AST 29 11/16/2018   ALT 23 11/16/2018   PROT 6.4 11/16/2018   ALBUMIN 4.7 04/27/2018   CALCIUM 9.5 11/16/2018   GFRAA 88 11/16/2018   QFTBGOLDPLUS NEGATIVE 11/16/2018    Speciality Comments: No specialty comments available.  Procedures:  No procedures performed Allergies: Doxycycline and Humira [adalimumab]   Assessment / Plan:     Visit Diagnoses: Rheumatoid arthritis involving multiple sites with positive rheumatoid factor ([place]): He has no active synovitis at this time.  He is not any recent rheumatoid arthritis flares.  He continues to inject Actemra 162 mg subcutaneously every week and takes sulfasalazine 500 mg 2 tablets twice daily.   He presents today with right shoulder joint pain that started several months ago.  He has not had any recent injuries or overuse activities.  X-rays of the right shoulder were obtained today.  He declined  a right shoulder subacromial cortisone injection today but will call us when he is ready for the injection.  He continues have chronic pain in bilateral hands and synovial thickening was noted but no active synovitis.  He will continue on the current treatment regimen.  He does not need any refills at this time.  He was advised to notify us if he develops increased joint pain or joint swelling.  He will follow-up in the office in 5 months.  High risk medication use -  Actemra 162 mg weekly sq injections and sulfasalazine 1000 mg twice daily.  CBC; CMP was within normal limits on 11/16/2018.  He will return for CBC and CMP in November and every 3 months to monitor for drug toxicity.  TB Gold was negative on 11/16/2018.  We will continue to monitor TB Gold on a yearly basis.  Chronic right shoulder pain -He presents today with right shoulder joint pain that started several months ago.  He denies any injuries or overuse activities recently.  He has not been lifting any heavy objects.  He has been experiencing nocturnal pain.  The pain has progressively been getting worse over the past several weeks.  He states at times the pain is a 9/10.  X-rays of the right shoulder were obtained today.  He reports that he has a very busy work week and does not want to have a cortisone junction today but will call us when he is ready for a subacromial cortisone injection.  He was given a handout of shoulder exercises to perform. plan: XR Shoulder Right  Primary osteoarthritis of both knees: He has good range of motion with no discomfort.  No warmth or effusion was noted.  Plantar fasciitis of left foot:  Resolved   Spondylolisthesis of lumbar region: Chronic pain.  He experiences increased low back pain and stiffness in the  evenings after working during the day.  He uses a heating pad on his lower back which resolves his discomfort.  He has no midline spinal tenderness on exam today.  Osteopenia of multiple sites: He has thoracic kyphosis on exam.  He has been taking vitamin D 1000 units by mouth daily.  History of vertebral fracture: No midline spinal tenderness.  Other medical conditions are listed as follows:  Multiple pulmonary nodules  Smoker  History of COPD  History of MRSA infection  Palpitations   Orders: Orders Placed This Encounter  Procedures   XR Shoulder Right   Meds ordered this encounter  Medications   cyclobenzaprine (FLEXERIL) 10 MG tablet    Sig: TAKE 1 TABLET BY MOUTH EACH NIGHT AT BEDTIME AS NEEDED    Dispense:  30 tablet    Refill:  0    Face-to-face time spent with patient was 30 minutes. Greater than 50% of time was spent in counseling and coordination of care.  Follow-Up Instructions: Return in about 5 months (around 05/10/2019) for Rheumatoid arthritis, Osteoarthritis.   Ofilia Neas, PA-C  Note - This record has been created using Dragon software.  Chart creation errors have been sought, but may not always  have been located. Such creation errors do not reflect on  the standard of medical care.

## 2018-12-08 ENCOUNTER — Other Ambulatory Visit: Payer: Self-pay

## 2018-12-08 ENCOUNTER — Ambulatory Visit (INDEPENDENT_AMBULATORY_CARE_PROVIDER_SITE_OTHER): Payer: BC Managed Care – PPO | Admitting: Physician Assistant

## 2018-12-08 ENCOUNTER — Encounter: Payer: Self-pay | Admitting: Physician Assistant

## 2018-12-08 ENCOUNTER — Ambulatory Visit: Payer: Self-pay

## 2018-12-08 VITALS — BP 121/75 | HR 100 | Resp 15 | Ht 75.0 in | Wt 250.0 lb

## 2018-12-08 DIAGNOSIS — R002 Palpitations: Secondary | ICD-10-CM

## 2018-12-08 DIAGNOSIS — M722 Plantar fascial fibromatosis: Secondary | ICD-10-CM | POA: Diagnosis not present

## 2018-12-08 DIAGNOSIS — Z8614 Personal history of Methicillin resistant Staphylococcus aureus infection: Secondary | ICD-10-CM

## 2018-12-08 DIAGNOSIS — R918 Other nonspecific abnormal finding of lung field: Secondary | ICD-10-CM

## 2018-12-08 DIAGNOSIS — M0579 Rheumatoid arthritis with rheumatoid factor of multiple sites without organ or systems involvement: Secondary | ICD-10-CM

## 2018-12-08 DIAGNOSIS — Z8709 Personal history of other diseases of the respiratory system: Secondary | ICD-10-CM

## 2018-12-08 DIAGNOSIS — G8929 Other chronic pain: Secondary | ICD-10-CM

## 2018-12-08 DIAGNOSIS — M4316 Spondylolisthesis, lumbar region: Secondary | ICD-10-CM

## 2018-12-08 DIAGNOSIS — M170 Bilateral primary osteoarthritis of knee: Secondary | ICD-10-CM | POA: Diagnosis not present

## 2018-12-08 DIAGNOSIS — F17200 Nicotine dependence, unspecified, uncomplicated: Secondary | ICD-10-CM

## 2018-12-08 DIAGNOSIS — M25511 Pain in right shoulder: Secondary | ICD-10-CM

## 2018-12-08 DIAGNOSIS — M8589 Other specified disorders of bone density and structure, multiple sites: Secondary | ICD-10-CM

## 2018-12-08 DIAGNOSIS — Z8781 Personal history of (healed) traumatic fracture: Secondary | ICD-10-CM

## 2018-12-08 DIAGNOSIS — Z79899 Other long term (current) drug therapy: Secondary | ICD-10-CM | POA: Diagnosis not present

## 2018-12-08 MED ORDER — CYCLOBENZAPRINE HCL 10 MG PO TABS: ORAL_TABLET | ORAL | 0 refills | Status: DC

## 2018-12-08 NOTE — Patient Instructions (Addendum)
Standing Labs We placed an order today for your standing lab work.    Please come back and get your standing labs in November and 3 months   We have open lab daily Monday through Thursday from 8:30-12:30 PM and 1:30-4:30 PM and Friday from 8:30-12:30 PM and 1:30 -4:00 PM at the office of Dr. [name].   You may experience shorter wait times on Monday and Friday afternoons. The office is located at [address] [place], [place], [place] [address] No appointment is necessary.   Labs are drawn by [facility].  You may receive a bill from [facility] for your lab work.  If you wish to have your labs drawn at another location, please call the office 24 hours in advance to send orders.  If you have any questions regarding directions or hours of operation,  please call [phone].   Just as a reminder please drink plenty of water prior to coming for your lab work. Thanks!   Shoulder Exercises Ask your health care provider which exercises are safe for you. Do exercises exactly as told by your health care provider and adjust them as directed. It is normal to feel mild stretching, pulling, tightness, or discomfort as you do these exercises. Stop right away if you feel sudden pain or your pain gets worse. Do not begin these exercises until told by your health care provider. Stretching exercises External rotation and abduction This exercise is sometimes called corner stretch. This exercise rotates your arm outward (external rotation) and moves your arm out from your body (abduction). 1. Stand in a doorway with one of your feet slightly in front of the other. This is called a staggered stance. If you cannot reach your forearms to the door frame, stand facing a corner of a room. 2. Choose one of the following positions as told by your health care provider: ? Place your hands and forearms on the door frame above your head. ? Place your hands and forearms on the door frame at the height of your  head. ? Place your hands on the door frame at the height of your elbows. 3. Slowly move your weight onto your front foot until you feel a stretch across your chest and in the front of your shoulders. Keep your head and chest upright and keep your abdominal muscles tight. 4. Hold for __________ seconds. 5. To release the stretch, shift your weight to your back foot. Repeat __________ times. Complete this exercise __________ times a day. Extension, standing 1. Stand and hold a broomstick, a cane, or a similar object behind your back. ? Your hands should be a little wider than shoulder width apart. ? Your palms should face away from your back. 2. Keeping your elbows straight and your shoulder muscles relaxed, move the stick away from your body until you feel a stretch in your shoulders (extension). ? Avoid shrugging your shoulders while you move the stick. Keep your shoulder blades tucked down toward the middle of your back. 3. Hold for __________ seconds. 4. Slowly return to the starting position. Repeat __________ times. Complete this exercise __________ times a day. Range-of-motion exercises Pendulum  1. Stand near a wall or a surface that you can hold onto for balance. 2. Bend at the waist and let your left / right arm hang straight down. Use your other arm to support you. Keep your back straight and do not lock your knees. 3. Relax your left / right arm and shoulder muscles, and move your  hips and your trunk so your left / right arm swings freely. Your arm should swing because of the motion of your body, not because you are using your arm or shoulder muscles. 4. Keep moving your hips and trunk so your arm swings in the following directions, as told by your health care provider: ? Side to side. ? Forward and backward. ? In clockwise and counterclockwise circles. 5. Continue each motion for __________ seconds, or for as long as told by your health care provider. 6. Slowly return to the  starting position. Repeat __________ times. Complete this exercise __________ times a day. Shoulder flexion, standing  1. Stand and hold a broomstick, a cane, or a similar object. Place your hands a little more than shoulder width apart on the object. Your left / right hand should be palm up, and your other hand should be palm down. 2. Keep your elbow straight and your shoulder muscles relaxed. Push the stick up with your healthy arm to raise your left / right arm in front of your body, and then over your head until you feel a stretch in your shoulder (flexion). ? Avoid shrugging your shoulder while you raise your arm. Keep your shoulder blade tucked down toward the middle of your back. 3. Hold for __________ seconds. 4. Slowly return to the starting position. Repeat __________ times. Complete this exercise __________ times a day. Shoulder abduction, standing 1. Stand and hold a broomstick, a cane, or a similar object. Place your hands a little more than shoulder width apart on the object. Your left / right hand should be palm up, and your other hand should be palm down. 2. Keep your elbow straight and your shoulder muscles relaxed. Push the object across your body toward your left / right side. Raise your left / right arm to the side of your body (abduction) until you feel a stretch in your shoulder. ? Do not raise your arm above shoulder height unless your health care provider tells you to do that. ? If directed, raise your arm over your head. ? Avoid shrugging your shoulder while you raise your arm. Keep your shoulder blade tucked down toward the middle of your back. 3. Hold for __________ seconds. 4. Slowly return to the starting position. Repeat __________ times. Complete this exercise __________ times a day. Internal rotation  1. Place your left / right hand behind your back, palm up. 2. Use your other hand to dangle an exercise band, a towel, or a similar object over your shoulder. Grasp  the band with your left / right hand so you are holding on to both ends. 3. Gently pull up on the band until you feel a stretch in the front of your left / right shoulder. The movement of your arm toward the center of your body is called internal rotation. ? Avoid shrugging your shoulder while you raise your arm. Keep your shoulder blade tucked down toward the middle of your back. 4. Hold for __________ seconds. 5. Release the stretch by letting go of the band and lowering your hands. Repeat __________ times. Complete this exercise __________ times a day. Strengthening exercises External rotation  1. Sit in a stable chair without armrests. 2. Secure an exercise band to a stable object at elbow height on your left / right side. 3. Place a soft object, such as a folded towel or a small pillow, between your left / right upper arm and your body to move your elbow about 4 inches (  10 cm) away from your side. 4. Hold the end of the exercise band so it is tight and there is no slack. 5. Keeping your elbow pressed against the soft object, slowly move your forearm out, away from your abdomen (external rotation). Keep your body steady so only your forearm moves. 6. Hold for __________ seconds. 7. Slowly return to the starting position. Repeat __________ times. Complete this exercise __________ times a day. Shoulder abduction  1. Sit in a stable chair without armrests, or stand up. 2. Hold a __________ weight in your left / right hand, or hold an exercise band with both hands. 3. Start with your arms straight down and your left / right palm facing in, toward your body. 4. Slowly lift your left / right hand out to your side (abduction). Do not lift your hand above shoulder height unless your health care provider tells you that this is safe. ? Keep your arms straight. ? Avoid shrugging your shoulder while you do this movement. Keep your shoulder blade tucked down toward the middle of your back. 5. Hold  for __________ seconds. 6. Slowly lower your arm, and return to the starting position. Repeat __________ times. Complete this exercise __________ times a day. Shoulder extension 1. Sit in a stable chair without armrests, or stand up. 2. Secure an exercise band to a stable object in front of you so it is at shoulder height. 3. Hold one end of the exercise band in each hand. Your palms should face each other. 4. Straighten your elbows and lift your hands up to shoulder height. 5. Step back, away from the secured end of the exercise band, until the band is tight and there is no slack. 6. Squeeze your shoulder blades together as you pull your hands down to the sides of your thighs (extension). Stop when your hands are straight down by your sides. Do not let your hands go behind your body. 7. Hold for __________ seconds. 8. Slowly return to the starting position. Repeat __________ times. Complete this exercise __________ times a day. Shoulder row 1. Sit in a stable chair without armrests, or stand up. 2. Secure an exercise band to a stable object in front of you so it is at waist height. 3. Hold one end of the exercise band in each hand. Position your palms so that your thumbs are facing the ceiling (neutral position). 4. Bend each of your elbows to a 90-degree angle (right angle) and keep your upper arms at your sides. 5. Step back until the band is tight and there is no slack. 6. Slowly pull your elbows back behind you. 7. Hold for __________ seconds. 8. Slowly return to the starting position. Repeat __________ times. Complete this exercise __________ times a day. Shoulder press-ups  1. Sit in a stable chair that has armrests. Sit upright, with your feet flat on the floor. 2. Put your hands on the armrests so your elbows are bent and your fingers are pointing forward. Your hands should be about even with the sides of your body. 3. Push down on the armrests and use your arms to lift yourself  off the chair. Straighten your elbows and lift yourself up as much as you comfortably can. ? Move your shoulder blades down, and avoid letting your shoulders move up toward your ears. ? Keep your feet on the ground. As you get stronger, your feet should support less of your body weight as you lift yourself up. 4. Hold for __________ seconds.  5. Slowly lower yourself back into the chair. Repeat __________ times. Complete this exercise __________ times a day. Wall push-ups  1. Stand so you are facing a stable wall. Your feet should be about one arm-length away from the wall. 2. Lean forward and place your palms on the wall at shoulder height. 3. Keep your feet flat on the floor as you bend your elbows and lean forward toward the wall. 4. Hold for __________ seconds. 5. Straighten your elbows to push yourself back to the starting position. Repeat __________ times. Complete this exercise __________ times a day. This information is not intended to replace advice given to you by your health care provider. Make sure you discuss any questions you have with your health care provider. Document Released: 02/05/2005 Document Revised: 07/16/2018 Document Reviewed: 04/23/2018 Elsevier Patient Education  2020 [employer].

## 2018-12-10 ENCOUNTER — Other Ambulatory Visit: Payer: Self-pay | Admitting: *Deleted

## 2018-12-10 MED ORDER — ACTEMRA 162 MG/0.9ML ~~LOC~~ SOSY: PREFILLED_SYRINGE | SUBCUTANEOUS | 0 refills | Status: DC

## 2018-12-10 NOTE — Telephone Encounter (Signed)
Last Visit: 12/08/18 Next Visit: 05/13/19 Labs: 11/16/18 stable TB Gold: 11/16/18 Neg   Okay to refill per [name]

## 2018-12-14 ENCOUNTER — Other Ambulatory Visit: Payer: Self-pay | Admitting: Rheumatology

## 2018-12-15 NOTE — Telephone Encounter (Signed)
Last Visit: 12/08/18 Next Visit: 05/13/19 Labs: 11/16/18 stable  Okay to refill per [name]

## 2018-12-21 DIAGNOSIS — B354 Tinea corporis: Secondary | ICD-10-CM | POA: Diagnosis not present

## 2018-12-23 ENCOUNTER — Ambulatory Visit (INDEPENDENT_AMBULATORY_CARE_PROVIDER_SITE_OTHER): Payer: BC Managed Care – PPO | Admitting: Rheumatology

## 2018-12-23 ENCOUNTER — Encounter: Payer: Self-pay | Admitting: Physician Assistant

## 2018-12-23 ENCOUNTER — Other Ambulatory Visit: Payer: Self-pay

## 2018-12-23 VITALS — BP 122/75 | HR 78 | Resp 14 | Ht 75.0 in | Wt 252.0 lb

## 2018-12-23 DIAGNOSIS — Z79899 Other long term (current) drug therapy: Secondary | ICD-10-CM | POA: Diagnosis not present

## 2018-12-23 DIAGNOSIS — M7022 Olecranon bursitis, left elbow: Secondary | ICD-10-CM | POA: Diagnosis not present

## 2018-12-23 DIAGNOSIS — M8589 Other specified disorders of bone density and structure, multiple sites: Secondary | ICD-10-CM

## 2018-12-23 DIAGNOSIS — R918 Other nonspecific abnormal finding of lung field: Secondary | ICD-10-CM

## 2018-12-23 DIAGNOSIS — F17200 Nicotine dependence, unspecified, uncomplicated: Secondary | ICD-10-CM

## 2018-12-23 DIAGNOSIS — M0579 Rheumatoid arthritis with rheumatoid factor of multiple sites without organ or systems involvement: Secondary | ICD-10-CM | POA: Diagnosis not present

## 2018-12-23 DIAGNOSIS — Z8709 Personal history of other diseases of the respiratory system: Secondary | ICD-10-CM

## 2018-12-23 DIAGNOSIS — M170 Bilateral primary osteoarthritis of knee: Secondary | ICD-10-CM | POA: Diagnosis not present

## 2018-12-23 DIAGNOSIS — Z8614 Personal history of Methicillin resistant Staphylococcus aureus infection: Secondary | ICD-10-CM

## 2018-12-23 DIAGNOSIS — M47816 Spondylosis without myelopathy or radiculopathy, lumbar region: Secondary | ICD-10-CM

## 2018-12-23 DIAGNOSIS — Z8781 Personal history of (healed) traumatic fracture: Secondary | ICD-10-CM

## 2018-12-23 NOTE — Progress Notes (Signed)
Office Visit Note  Patient: Jeff Russell             Date of Birth: Jul 20, 1965           MRN: [id number]             PCP: Biagio Borg, MD Referring: Biagio Borg, MD Visit Date: 12/23/2018 Occupation: @GUAROCC @  Subjective:  Other (left elbow soreness )   History of Present Illness: Evelio Netti Hovland is a 53 y.o. male with seropositive rheumatoid arthritis, osteoarthritis and disc disease.  He was seen today on urgent basis due to left elbow swelling.  He states he drove about 8 hours approximately 3 weeks ago.  He has noticed pain and swelling in his left elbow for the last 1 week.  He states the pain is not as bad as the swelling .  He has been taking Actemra and sulfasalazine on a regular basis.  He missed only 1 dose of Actemra about a month ago.  He is noticed some swelling in his hands as well.  Activities of Daily Living:  Patient reports morning stiffness for 30 minutes.   Patient Reports nocturnal pain.  Difficulty dressing/grooming: Denies Difficulty climbing stairs: Reports Difficulty getting out of chair: Reports Difficulty using hands for taps, buttons, cutlery, and/or writing: Denies  Review of Systems  Constitutional: Negative for fatigue.  HENT: Negative for mouth sores, mouth dryness and nose dryness.   Eyes: Negative for itching and dryness.  Respiratory: Negative for shortness of breath, wheezing and difficulty breathing.   Cardiovascular: Negative for chest pain and palpitations.  Gastrointestinal: Negative for blood in stool, constipation and diarrhea.  Endocrine: Negative for increased urination.  Genitourinary: Negative for difficulty urinating and painful urination.  Musculoskeletal: Positive for arthralgias, joint pain, joint swelling and morning stiffness.  Skin: Negative for rash.  Allergic/Immunologic: Negative for susceptible to infections.  Neurological: Negative for dizziness, numbness, headaches, memory loss and weakness.  Hematological:  Negative for swollen glands.  Psychiatric/Behavioral: Negative for confusion.    PMFS History:  Patient Active Problem List   Diagnosis Date Noted  . Mixed hyperlipidemia 08/03/2018  . History of PSVT (paroxysmal supraventricular tachycardia) 08/03/2018  . Cellulitis of right leg 06/16/2018  . Preventative health care 04/27/2018  . Vitamin D deficiency 04/27/2018  . History of radiofrequency ablation (RFA) procedure for cardiac arrhythmia- Right and Left atrial pathway 04/06/2018  . Chest pain on exertion 03/15/2018  . Muscle weakness (generalized) 03/06/2017  . Weakness 03/04/2017  . Gait abnormality 03/04/2017  . Low back pain without sciatica 03/04/2017  . Weakness of both lower extremities 01/12/2017  . Spondylolisthesis of lumbar region 07/16/2016  . High risk medication use 07/14/2016  . 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 Primary osteoarthritis of both knees 07/14/2016  . 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 Vertebral compression fracture ([place]) 07/14/2016  . Osteopenia of multiple sites 07/14/2016  . Plantar fasciitis of left foot 07/14/2016  . COPD 0 / [place] smoking 02/28/2016  . Multiple pulmonary nodules 11/21/2015  . Multiple environmental allergies 05/16/2014  . Cigarette smoker 05/16/2014  . Rheumatoid arthritis ([place]) 05/01/2011    Past Medical History:  Diagnosis Date  . Allergy   . Arthritis   . COPD (chronic obstructive pulmonary disease) ([place])    per pt  . GERD (gastroesophageal reflux disease)   . History of MRSA infection    right leg per patient  . History of radiofrequency ablation (RFA) procedure for cardiac arrhythmia- Right and  Left atrial pathway 04/06/2018  . MSSA (methicillin susceptible Staphylococcus aureus) infection 05/01/2011  . Pneumonia   . Rheumatoid nodules ([place]) 08/06/2014   right lung-per CT scan ordered by Dr Estanislado Pandy  . Septic bursitis 05/01/2011  . Shortness of breath    occ  . SVT (supraventricular tachycardia) ([place])     a. 03/2018 s/p RFCA of 2 concealed accessory pathways (R postlat w/ AVRT @ 280 ms & L postsept w/ AVRT @ 256 ms).    Family History  Problem Relation Age of Onset  . Arthritis Maternal Grandmother   . Angina Maternal Grandmother   . Colon cancer Paternal Grandfather        deceased  . Prostate cancer Father   . Hypercholesterolemia Mother   . Heart disease Mother        palpitations, ?heart failure  . Hypertension Mother   . Other Mother        Heart enlargement,left ventricle problems  . Congestive Heart Failure Paternal Grandmother   . Esophageal cancer Neg Hx   . Rectal cancer Neg Hx   . Stomach cancer Neg Hx    Past Surgical History:  Procedure Laterality Date  . BACK SURGERY    . carpel tunnel Bilateral   . CHOLECYSTECTOMY    . COLONOSCOPY    . EPIDURAL BLOCK INJECTION    . EYE SURGERY Left    wire in eye  . I&D EXTREMITY  03/24/2011   Procedure: IRRIGATION AND DEBRIDEMENT EXTREMITY;  Surgeon: Yvette Rack., MD;  Location: [place];  Service: Orthopedics;  Laterality: Right;  Irrigation and debridement right knee abcess and Bursa,  application of wound vac  . I&D EXTREMITY  03/28/2011   Procedure: IRRIGATION AND DEBRIDEMENT EXTREMITY;  Surgeon: Yvette Rack., MD;  Location: [place];  Service: Orthopedics;  Laterality: Right;  I&D right knee with wound closure  . LEFT HEART CATH AND CORONARY ANGIOGRAPHY N/A 03/16/2018   Procedure: LEFT HEART CATH AND CORONARY ANGIOGRAPHY;  Surgeon: Adrian Prows, MD;  Location: [facility];  Service: Cardiovascular;  Laterality: N/A;  . MENISCUS REPAIR Right 2017  . SVT ABLATION N/A 04/06/2018   Procedure: SVT ABLATION;  Surgeon: Evans Lance, MD;  Location: [facility];  Service: Cardiovascular;  Laterality: N/A;   Social History   Social History Narrative   Work or School: Art gallery manager Situation: lives with wife whom sees me as well       Spiritual Beliefs: believes in God; does not go to church       Lifestyle: no CV exercise - but physically active at work; diet is poor      Immunization History  Administered Date(s) Administered  . Influenza,inj,Quad PF,6+ Mos 02/26/2016, 04/07/2018  . Influenza-Unspecified 04/07/2014  . Tdap 11/06/2011     Objective: Vital Signs: BP 122/75 (BP Location: Right Arm, Patient Position: Sitting, Cuff Size: Normal)   Pulse 78   Resp 14   Ht 6\' 3"  (1.905 m)   Wt 252 lb (114.3 kg)   BMI 31.50 kg/m    Physical Exam Vitals signs and nursing note reviewed.  Constitutional:      Appearance: He is well-developed.  HENT:     Head: Normocephalic and atraumatic.  Eyes:     Conjunctiva/sclera: Conjunctivae normal.     Pupils: Pupils are equal, round, and reactive to light.  Neck:     Musculoskeletal: Normal range of motion and neck supple.  Cardiovascular:     Rate and Rhythm: Normal rate and regular rhythm.     Heart sounds: Normal heart sounds.  Pulmonary:     Effort: Pulmonary effort is normal.     Breath sounds: Normal breath sounds.  Abdominal:     General: Bowel sounds are normal.     Palpations: Abdomen is soft.  Skin:    General: Skin is warm and dry.     Capillary Refill: Capillary refill takes less than 2 seconds.  Neurological:     Mental Status: He is alert and oriented to person, place, and time.  Psychiatric:        Behavior: Behavior normal.      Musculoskeletal Exam: C-spine was in good range of motion.  Shoulder joints with good range of motion.  He has thickening of skin over bilateral elbows and olecranon bursitis left elbow.  He has thickening of MCP joints but no synovitis was noted.  Knee joints and ankle joints with good range of motion.  CDAI Exam: CDAI Score: 2.6  Patient Global: 3 mm; Provider Global: 3 mm Swollen: 1 ; Tender: 1  Joint Exam      Right  Left  Elbow     Swollen Tender     Investigation: No additional findings.  Imaging: Xr Shoulder Right  Result Date: 12/08/2018 No glenohumeral joint  space narrowing was noted.  No acromioclavicular joint space narrowing was noted.  Some spurring was noted at the acromioclavicular joint.  No chondrocalcinosis was noted. Impression: Mild acromioclavicular arthritis   Recent Labs: Lab Results  Component Value Date   WBC 5.4 11/16/2018   HGB 14.3 11/16/2018   PLT 181 11/16/2018   NA 139 11/16/2018   K 4.5 11/16/2018   CL 106 11/16/2018   CO2 27 11/16/2018   GLUCOSE 87 11/16/2018   BUN 15 11/16/2018   CREATININE 1.10 11/16/2018   BILITOT 0.7 11/16/2018   ALKPHOS 77 04/27/2018   AST 29 11/16/2018   ALT 23 11/16/2018   PROT 6.4 11/16/2018   ALBUMIN 4.7 04/27/2018   CALCIUM 9.5 11/16/2018   GFRAA 88 11/16/2018   QFTBGOLDPLUS NEGATIVE 11/16/2018    Speciality Comments: No specialty comments available.  Procedures:  Medium Joint Inj: L olecranon bursa on 12/23/2018 2:39 PM Indications: pain Details: 27 G 1.5 in needle, posterior approach Medications: 1 mL lidocaine 1 % Aspirate: 7 mL Outcome: tolerated well, no immediate complications Procedure, treatment alternatives, risks and benefits explained, specific risks discussed. Consent was given by the patient. Immediately prior to procedure a time out was called to verify the correct patient, procedure, equipment, support staff and site/side marked as required. Patient was prepped and draped in the usual sterile fashion.     Allergies: Doxycycline and Humira [adalimumab]   Assessment / Plan:     Visit Diagnoses: Olecranon bursitis, left elbowpatient developed olecranon bursitis after driving for 8 hours.  He also missed 1 dose of Actemra about a month ago.  Have advised him not to lean on his elbows for long distance.  After different treatment options were discussed left elbow bursitis was aspirated.  The synovial fluid was sent for cell count crystals and culture.  I decided not to inject at this point until we have culture results back.  He has history of MRSA in the past.   Rheumatoid arthritis involving multiple sites with positive rheumatoid factor (HCC)-he complains of discomfort and pain in his joints.  No synovitis was noted over MCPs.  He does  have synovial thickening and incomplete fist formation.  High risk medication use-Actemra 162mg  every 7 days and sulfasalazine 500 mg 2 tablets twice daily. Last TB gold negative on 11/16/2018 and will monitor yearly.  Last lipid panel within normal limits on 04/27/2018 and will monitor yearly.  Most recent CBC/CMP within normal limits on 11/16/2018.  Primary osteoarthritis of both knees-chronic pain  Spondylosis of lumbar region without myelopathy or radiculopathy-chronic pain  Other medical problems are listed as follows:  Osteopenia of multiple sites  History of vertebral fracture  Multiple pulmonary nodules  History of COPD  Smoker  History of MRSA infection  Orders: No orders of the defined types were placed in this encounter.  No orders of the defined types were placed in this encounter.    Follow-Up Instructions: Return in about 3 months (around 03/24/2019) for Rheumatoid arthritis, Osteoarthritis.   [name], MD  Note - This record has been created using Editor, commissioning.  Chart creation errors have been sought, but may not always  have been located. Such creation errors do not reflect on  the standard of medical care.

## 2018-12-27 NOTE — Progress Notes (Signed)
Synovial fluid is negative for infection.  If he has recurrence of olecranon bursitis we can inject with cortisone.  If his symptoms have not improved then there is no need for the visit.

## 2018-12-29 LAB — ANAEROBIC AND AEROBIC CULTURE
AER RESULT:: NO GROWTH
MICRO NUMBER:: 897178
MICRO NUMBER:: 897179
SPECIMEN QUALITY:: ADEQUATE
SPECIMEN QUALITY:: ADEQUATE

## 2018-12-29 LAB — SYNOVIAL CELL COUNT + DIFF, W/ CRYSTALS
Basophils, %: 0 %
Eosinophils-Synovial: 0 % (ref 0–2)
Lymphocytes-Synovial Fld: 20 % (ref 0–74)
Monocyte/Macrophage: 10 % (ref 0–69)
Neutrophil, Synovial: 70 % — ABNORMAL HIGH (ref 0–24)
Synoviocytes, %: 0 % (ref 0–15)
WBC, Synovial: 1322 cells/uL — ABNORMAL HIGH (ref <150)

## 2019-01-07 ENCOUNTER — Other Ambulatory Visit: Payer: Self-pay | Admitting: Rheumatology

## 2019-01-07 ENCOUNTER — Other Ambulatory Visit: Payer: Self-pay | Admitting: Physician Assistant

## 2019-01-07 NOTE — Telephone Encounter (Signed)
Last Visit: 12/23/18 Next Visit: 04/06/19  Okay to refill per [name]

## 2019-01-19 ENCOUNTER — Other Ambulatory Visit: Payer: Self-pay | Admitting: Rheumatology

## 2019-01-19 NOTE — Telephone Encounter (Signed)
Last Visit: 12/23/18 Next Visit: 04/06/19  Okay to refill per [name]

## 2019-02-07 ENCOUNTER — Other Ambulatory Visit: Payer: Self-pay | Admitting: Rheumatology

## 2019-02-07 NOTE — Telephone Encounter (Signed)
Last Visit: 12/23/18 Next Visit: 04/06/19  Okay to refill per [name]

## 2019-03-02 ENCOUNTER — Other Ambulatory Visit: Payer: Self-pay | Admitting: *Deleted

## 2019-03-02 ENCOUNTER — Telehealth: Payer: Self-pay | Admitting: *Deleted

## 2019-03-02 DIAGNOSIS — Z79899 Other long term (current) drug therapy: Secondary | ICD-10-CM

## 2019-03-02 DIAGNOSIS — M2550 Pain in unspecified joint: Secondary | ICD-10-CM

## 2019-03-02 MED ORDER — PREDNISONE 5 MG PO TABS: ORAL_TABLET | ORAL | 0 refills | Status: AC

## 2019-03-02 NOTE — Telephone Encounter (Signed)
Ok to send in prednisone taper starting at 20 mg tapering by 5 mg every 4 days.

## 2019-03-02 NOTE — Telephone Encounter (Signed)
Prescription sent to the pharmacy, patient is aware.  

## 2019-03-02 NOTE — Addendum Note (Signed)
Addended by: Francis Gaines C on: 03/02/2019 12:09 PM   Modules accepted: Orders

## 2019-03-02 NOTE — Telephone Encounter (Signed)
Patient states the pain/swelling in his left hand started at "the beginning of the week." per patient, he has not missed any doses of actemra or SSZ. Patient is requesting a prednisone taper. Please advise.

## 2019-03-02 NOTE — Telephone Encounter (Signed)
Patient having flare, swelling in left hand, [facility].

## 2019-03-03 LAB — CMP14+EGFR
ALT: 26 IU/L (ref 0–44)
AST: 29 IU/L (ref 0–40)
Albumin/Globulin Ratio: 2.1 (ref 1.2–2.2)
Albumin: 4.7 g/dL (ref 3.8–4.9)
Alkaline Phosphatase: 88 IU/L (ref 39–117)
BUN/Creatinine Ratio: 8 — ABNORMAL LOW (ref 9–20)
BUN: 8 mg/dL (ref 6–24)
Bilirubin Total: 0.3 mg/dL (ref 0.0–1.2)
CO2: 24 mmol/L (ref 20–29)
Calcium: 9.9 mg/dL (ref 8.7–10.2)
Chloride: 103 mmol/L (ref 96–106)
Creatinine, Ser: 1.05 mg/dL (ref 0.76–1.27)
GFR calc Af Amer: 93 mL/min/{1.73_m2} (ref >59)
GFR calc non Af Amer: 81 mL/min/{1.73_m2} (ref >59)
Globulin, Total: 2.2 g/dL (ref 1.5–4.5)
Glucose: 99 mg/dL (ref 65–99)
Potassium: 4.3 mmol/L (ref 3.5–5.2)
Sodium: 142 mmol/L (ref 134–144)
Total Protein: 6.9 g/dL (ref 6.0–8.5)

## 2019-03-03 LAB — CBC WITH DIFFERENTIAL/PLATELET
Basophils Absolute: 0.1 10*3/uL (ref 0.0–0.2)
Basos: 1 %
EOS (ABSOLUTE): 0.2 10*3/uL (ref 0.0–0.4)
Eos: 4 %
Hematocrit: 47.5 % (ref 37.5–51.0)
Hemoglobin: 16.1 g/dL (ref 13.0–17.7)
Immature Grans (Abs): 0 10*3/uL (ref 0.0–0.1)
Immature Granulocytes: 1 %
Lymphocytes Absolute: 2 10*3/uL (ref 0.7–3.1)
Lymphs: 38 %
MCH: 34.5 pg — ABNORMAL HIGH (ref 26.6–33.0)
MCHC: 33.9 g/dL (ref 31.5–35.7)
MCV: 102 fL — ABNORMAL HIGH (ref 79–97)
Monocytes Absolute: 0.5 10*3/uL (ref 0.1–0.9)
Monocytes: 9 %
Neutrophils Absolute: 2.5 10*3/uL (ref 1.4–7.0)
Neutrophils: 47 %
Platelets: 194 10*3/uL (ref 150–450)
RBC: 4.67 x10E6/uL (ref 4.14–5.80)
RDW: 11.8 % (ref 11.6–15.4)
WBC: 5.3 10*3/uL (ref 3.4–10.8)

## 2019-03-07 ENCOUNTER — Other Ambulatory Visit: Payer: Self-pay | Admitting: Rheumatology

## 2019-03-07 NOTE — Progress Notes (Signed)
Labs are stable.

## 2019-03-07 NOTE — Telephone Encounter (Signed)
TB Gold 11/16/2018 negative

## 2019-03-07 NOTE — Telephone Encounter (Signed)
Ok to refill 

## 2019-03-07 NOTE — Telephone Encounter (Signed)
Notes recorded by [name], MD on 03/07/2019 at 10:13 AM EST  Labs are stable.  Last RF 12/10/2018 Last appt 12/23/2018 Next appt 04/06/2019

## 2019-03-08 ENCOUNTER — Telehealth: Payer: Self-pay | Admitting: Rheumatology

## 2019-03-08 NOTE — Telephone Encounter (Signed)
Patient request a refill on Actemra sent to [facility].

## 2019-03-08 NOTE — Telephone Encounter (Signed)
Advised patient actemra was sent to [facility] on 03/07/2019. Patient verbalized understanding.

## 2019-03-10 ENCOUNTER — Other Ambulatory Visit: Payer: Self-pay | Admitting: Rheumatology

## 2019-03-10 NOTE — Telephone Encounter (Signed)
Last Visit: 12/23/2018 Next Visit: 04/06/2019 Labs: 03/02/2019 stable  Okay to refill per [name].

## 2019-03-15 ENCOUNTER — Other Ambulatory Visit: Payer: Self-pay

## 2019-03-15 DIAGNOSIS — Z20822 Contact with and (suspected) exposure to covid-19: Secondary | ICD-10-CM

## 2019-03-16 LAB — NOVEL CORONAVIRUS, NAA: SARS-CoV-2, NAA: NOT DETECTED

## 2019-03-23 DIAGNOSIS — B356 Tinea cruris: Secondary | ICD-10-CM | POA: Diagnosis not present

## 2019-03-23 DIAGNOSIS — B353 Tinea pedis: Secondary | ICD-10-CM | POA: Diagnosis not present

## 2019-03-25 ENCOUNTER — Ambulatory Visit: Payer: BC Managed Care – PPO | Attending: Internal Medicine

## 2019-03-25 ENCOUNTER — Other Ambulatory Visit: Payer: BC Managed Care – PPO

## 2019-03-25 DIAGNOSIS — Z20828 Contact with and (suspected) exposure to other viral communicable diseases: Secondary | ICD-10-CM | POA: Diagnosis not present

## 2019-03-25 DIAGNOSIS — Z20822 Contact with and (suspected) exposure to covid-19: Secondary | ICD-10-CM

## 2019-03-26 LAB — NOVEL CORONAVIRUS, NAA: SARS-CoV-2, NAA: NOT DETECTED

## 2019-03-30 ENCOUNTER — Other Ambulatory Visit: Payer: Self-pay | Admitting: Physician Assistant

## 2019-03-30 NOTE — Telephone Encounter (Signed)
Last Visit: 12/23/2018 Next Visit: 05/13/2019 Labs: 03/02/2019 labs are stable.  TB Gold: 11/16/2018 negative   Okay to refill per [name].

## 2019-04-04 ENCOUNTER — Other Ambulatory Visit: Payer: Self-pay | Admitting: Rheumatology

## 2019-04-04 NOTE — Telephone Encounter (Signed)
Last Visit:12/23/2018  Next Visit: 05/13/2019  Okay to refill per [name].

## 2019-04-06 ENCOUNTER — Ambulatory Visit: Payer: BC Managed Care – PPO | Admitting: Physician Assistant

## 2019-04-19 ENCOUNTER — Other Ambulatory Visit: Payer: Self-pay | Admitting: Rheumatology

## 2019-04-20 MED ORDER — METHOCARBAMOL 500 MG PO TABS: ORAL_TABLET | ORAL | 0 refills | Status: AC

## 2019-04-20 NOTE — Telephone Encounter (Signed)
Last Visit: 12/23/2018 Next Visit: 05/13/2019  Okay to refill Methocarbamol per [name]. To soon to refill Flexeril.

## 2019-05-02 ENCOUNTER — Other Ambulatory Visit: Payer: Self-pay | Admitting: Rheumatology

## 2019-05-02 NOTE — Telephone Encounter (Signed)
Last Visit:12/23/2018  Next Visit: 05/13/2019 Labs: 03/02/19 stable  Tb Gold: 11/30/18 Neg   Okay to refill per [name]

## 2019-05-03 ENCOUNTER — Encounter: Payer: BLUE CROSS/BLUE SHIELD | Admitting: Internal Medicine

## 2019-05-03 ENCOUNTER — Other Ambulatory Visit: Payer: Self-pay

## 2019-05-03 ENCOUNTER — Telehealth: Payer: Self-pay | Admitting: Rheumatology

## 2019-05-03 DIAGNOSIS — M0579 Rheumatoid arthritis with rheumatoid factor of multiple sites without organ or systems involvement: Secondary | ICD-10-CM

## 2019-05-03 DIAGNOSIS — M8589 Other specified disorders of bone density and structure, multiple sites: Secondary | ICD-10-CM

## 2019-05-03 DIAGNOSIS — M47816 Spondylosis without myelopathy or radiculopathy, lumbar region: Secondary | ICD-10-CM

## 2019-05-03 NOTE — Telephone Encounter (Signed)
Patient has had  a change in insurance. He has to go to [facility] now for treatment. Patient is not happy with this, but unable to do anything different. Patient is requesting a referral to [facility] in [facility]. Fax# [phone], Phone# [phone]. Please call to advise.

## 2019-05-03 NOTE — Telephone Encounter (Signed)
Referral placed and patient advised.

## 2019-05-13 ENCOUNTER — Ambulatory Visit: Payer: BLUE CROSS/BLUE SHIELD | Admitting: Rheumatology

## 2019-06-08 ENCOUNTER — Emergency Department (HOSPITAL_COMMUNITY)
Admission: EM | Admit: 2019-06-08 | Discharge: 2019-06-08 | Disposition: A | Discharge status: Home / Self Care | Payer: BLUE CROSS/BLUE SHIELD | Attending: Emergency Medicine | Admitting: Emergency Medicine

## 2019-06-08 ENCOUNTER — Emergency Department (HOSPITAL_COMMUNITY): Payer: BLUE CROSS/BLUE SHIELD

## 2019-06-08 ENCOUNTER — Encounter (HOSPITAL_COMMUNITY): Payer: Self-pay

## 2019-06-08 ENCOUNTER — Other Ambulatory Visit: Payer: Self-pay

## 2019-06-08 DIAGNOSIS — J449 Chronic obstructive pulmonary disease, unspecified: Secondary | ICD-10-CM | POA: Diagnosis not present

## 2019-06-08 DIAGNOSIS — Z79899 Other long term (current) drug therapy: Secondary | ICD-10-CM | POA: Diagnosis not present

## 2019-06-08 DIAGNOSIS — I4891 Unspecified atrial fibrillation: Secondary | ICD-10-CM | POA: Diagnosis not present

## 2019-06-08 LAB — CBC
HCT: 41.9 % (ref 39.0–52.0)
Hemoglobin: 14.4 g/dL (ref 13.0–17.0)
MCH: 35.1 pg — ABNORMAL HIGH (ref 26.0–34.0)
MCHC: 34.4 g/dL (ref 30.0–36.0)
MCV: 102.2 fL — ABNORMAL HIGH (ref 80.0–100.0)
Platelets: 222 10*3/uL (ref 150–400)
RBC: 4.1 MIL/uL — ABNORMAL LOW (ref 4.22–5.81)
RDW: 12.1 % (ref 11.5–15.5)
WBC: 6.6 10*3/uL (ref 4.0–10.5)
nRBC: 0 % (ref 0.0–0.2)

## 2019-06-08 LAB — TSH: TSH: 0.972 u[IU]/mL (ref 0.350–4.500)

## 2019-06-08 LAB — BASIC METABOLIC PANEL
Anion gap: 10 (ref 5–15)
BUN: 13 mg/dL (ref 6–20)
CO2: 25 mmol/L (ref 22–32)
Calcium: 9.4 mg/dL (ref 8.9–10.3)
Chloride: 104 mmol/L (ref 98–111)
Creatinine, Ser: 1 mg/dL (ref 0.61–1.24)
GFR calc Af Amer: >60 mL/min (ref >60)
GFR calc non Af Amer: >60 mL/min (ref >60)
Glucose, Bld: 102 mg/dL — ABNORMAL HIGH (ref 70–99)
Potassium: 3.7 mmol/L (ref 3.5–5.1)
Sodium: 139 mmol/L (ref 135–145)

## 2019-06-08 LAB — CBG MONITORING, ED: Glucose-Capillary: 94 mg/dL (ref 70–99)

## 2019-06-08 LAB — TROPONIN I (HIGH SENSITIVITY)
Troponin I (High Sensitivity): 15 ng/L (ref <18)
Troponin I (High Sensitivity): 57 ng/L — ABNORMAL HIGH (ref <18)

## 2019-06-08 MED ORDER — DILTIAZEM HCL 25 MG/5ML IV SOLN: 20.0000 mg | Freq: Once | INTRAVENOUS | Status: DC

## 2019-06-08 NOTE — ED Triage Notes (Signed)
Per [facility]: Pt from home, chest pain about 20 minutes, center of chest down left arm. Pt with no symptoms when EMS arrived. Pt was in a fib RVR when EMS arrived. Pt took 30 mg Cardizem tablet prior to EMS arrival. Pt feels no symptoms anymore. PCP suggested a heart monitor yesterday. Pt states that he was smoking a cigarette when this started. Pt does have rheumatoid arthritis and some swelling in his legs. Supposed to see  Pulmonology.

## 2019-06-08 NOTE — Discharge Instructions (Signed)
Please read and follow all provided instructions.  Your diagnoses today include:  1. Atrial fibrillation with RVR ([place])     Tests performed today include:  An EKG of your heart  A chest x-ray  Cardiac enzymes - a blood test for heart muscle damage, was going up but we think this was due to stress on your heart from your fast heart rate and not heart attack  Blood counts and electrolytes  Vital signs. See below for your results today.   Medications prescribed:  Please start taking a baby aspirin a day.  Follow-up instructions: Please follow-up with your primary care provider as soon as you can for further evaluation of your symptoms.  Call your doctor tomorrow and let them know that you had atrial fibrillation.  Return instructions:  SEEK IMMEDIATE MEDICAL ATTENTION IF:  You have severe chest pain, especially if the pain is crushing or pressure-like and spreads to the arms, back, neck, or jaw, or if you have sweating, nausea (feeling sick to your stomach), or shortness of breath. THIS IS AN EMERGENCY. Don't wait to see if the pain will go away. Get medical help at once. Call 911 or 0 (operator). DO NOT drive yourself to the hospital.   Your chest pain gets worse and does not go away with rest.   You have an attack of chest pain lasting longer than usual, despite rest and treatment with the medications your caregiver has prescribed.   You wake from sleep with chest pain or shortness of breath.  You feel dizzy or faint.  You have chest pain not typical of your usual pain for which you originally saw your caregiver.   You have any other emergent concerns regarding your health.  Additional Information: Chest pain comes from many different causes. Your caregiver has diagnosed you as having chest pain that is not specific for one problem, but does not require admission.  You are at low risk for an acute heart condition or other serious illness.   Your vital signs today were: BP  123/81 (BP Location: Right Arm)   Pulse 75   Temp 98 F (36.7 C) (Oral)   Resp 17   Ht 6\' 3"  (1.905 m)   Wt 119.7 kg   SpO2 98%   BMI 33.00 kg/m  If your blood pressure (BP) was elevated above 135/85 this visit, please have this repeated by your doctor within one month. --------------

## 2019-06-08 NOTE — ED Provider Notes (Signed)
[facility] EMERGENCY DEPARTMENT Provider Note   CSN: [id number] Arrival date & time: 06/08/19  1724     History Chief Complaint  Patient presents with  . Atrial Fibrillation    Jeff Russell is a 54 y.o. male.  Patient with history of SVT status post radiofrequency ablation done in 2019, normal coronary catheterization in 03/2018, COPD --presents to the emergency department today after episode of atrial fibrillation.  Patient called EMS from home after he developed chest pain in the middle of the chest with radiation to the left arm.  This was associated with diaphoresis.  No vomiting.  Pain started at rest.  He did take 30 mg of Cardizem that he had leftover from when he had SVT.  He states that he had never taken this before.  Patient with recent visit to pulmonology for lung nodules on 3/1 and saw his PCP yesterday on 3/2.  He declines Zio patch for similar symptoms at this time.  Patient reports having episodes of "heart stopping" several weeks ago, however he declined heart monitor because he had not had any symptoms over the past several weeks.  Patient denies having any associated lightheadedness or syncope, chest pain, shortness of breath with these episodes.  Denies anticoagulation.        Past Medical History:  Diagnosis Date  . Allergy   . Arthritis   . COPD (chronic obstructive pulmonary disease) ([place])    per pt  . GERD (gastroesophageal reflux disease)   . History of MRSA infection    right leg per patient  . History of radiofrequency ablation (RFA) procedure for cardiac arrhythmia- Right and Left atrial pathway 04/06/2018  . MSSA (methicillin susceptible Staphylococcus aureus) infection 05/01/2011  . Pneumonia   . Rheumatoid nodules ([place]) 08/06/2014   right lung-per CT scan ordered by Dr Estanislado Pandy  . Septic bursitis 05/01/2011  . Shortness of breath    occ  . SVT (supraventricular tachycardia) ([place])    a. 03/2018 s/p RFCA of 2 concealed accessory  pathways (R postlat w/ AVRT @ 280 ms & L postsept w/ AVRT @ 256 ms).    Patient Active Problem List   Diagnosis Date Noted  . Mixed hyperlipidemia 08/03/2018  . History of PSVT (paroxysmal supraventricular tachycardia) 08/03/2018  . Cellulitis of right leg 06/16/2018  . Preventative health care 04/27/2018  . Vitamin D deficiency 04/27/2018  . History of radiofrequency ablation (RFA) procedure for cardiac arrhythmia- Right and Left atrial pathway 04/06/2018  . Chest pain on exertion 03/15/2018  . Muscle weakness (generalized) 03/06/2017  . Weakness 03/04/2017  . Gait abnormality 03/04/2017  . Low back pain without sciatica 03/04/2017  . Weakness of both lower extremities 01/12/2017  . Spondylolisthesis of lumbar region 07/16/2016  . High risk medication use 07/14/2016  . 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 Primary osteoarthritis of both knees 07/14/2016  . 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 Vertebral compression fracture ([place]) 07/14/2016  . Osteopenia of multiple sites 07/14/2016  . Plantar fasciitis of left foot 07/14/2016  . COPD 0 / [place] smoking 02/28/2016  . Multiple pulmonary nodules 11/21/2015  . Multiple environmental allergies 05/16/2014  . Cigarette smoker 05/16/2014  . Rheumatoid arthritis ([place]) 05/01/2011    Past Surgical History:  Procedure Laterality Date  . BACK SURGERY    . carpel tunnel Bilateral   . CHOLECYSTECTOMY    . COLONOSCOPY    . EPIDURAL BLOCK INJECTION    . EYE SURGERY Left  wire in eye  . I & D EXTREMITY  03/24/2011   Procedure: IRRIGATION AND DEBRIDEMENT EXTREMITY;  Surgeon: Yvette Rack., MD;  Location: [facility];  Service: Orthopedics;  Laterality: Right;  Irrigation and debridement right knee abcess and Bursa,  application of wound vac  . I & D EXTREMITY  03/28/2011   Procedure: IRRIGATION AND DEBRIDEMENT EXTREMITY;  Surgeon: Yvette Rack., MD;  Location: [facility];  Service: Orthopedics;  Laterality: Right;  I&D right knee with  wound closure  . LEFT HEART CATH AND CORONARY ANGIOGRAPHY N/A 03/16/2018   Procedure: LEFT HEART CATH AND CORONARY ANGIOGRAPHY;  Surgeon: Adrian Prows, MD;  Location: [facility];  Service: Cardiovascular;  Laterality: N/A;  . MENISCUS REPAIR Right 2017  . SVT ABLATION N/A 04/06/2018   Procedure: SVT ABLATION;  Surgeon: Evans Lance, MD;  Location: [facility];  Service: Cardiovascular;  Laterality: N/A;       Family History  Problem Relation Age of Onset  . Arthritis Maternal Grandmother   . Angina Maternal Grandmother   . Colon cancer Paternal Grandfather        deceased  . Prostate cancer Father   . Hypercholesterolemia Mother   . Heart disease Mother        palpitations, ?heart failure  . Hypertension Mother   . Other Mother        Heart enlargement,left ventricle problems  . Congestive Heart Failure Paternal Grandmother   . Esophageal cancer Neg Hx   . Rectal cancer Neg Hx   . Stomach cancer Neg Hx     Social History   Tobacco Use  . Smoking status: Current Every Day Smoker    Packs/day: 2.00    Years: 30.00    Pack years: 60.00    Types: Cigarettes  . Smokeless tobacco: Never Used  Substance Use Topics  . Alcohol use: No    Comment: quit 20 yrs ago  . Drug use: No    Home Medications Prior to Admission medications   Medication Sig Start Date End Date Taking? Authorizing Provider  acetaminophen (TYLENOL ARTHRITIS PAIN) 650 MG CR tablet Take 1,300 mg by mouth 2 (two) times daily.     [provider]  ACTEMRA 162 MG/0.9ML SOSY INJECT 162MG  (1SYRINGE) UNDER THE SKIN EVERY WEEK 05/02/19   [name], MD  Cholecalciferol (VITAMIN D3) 1000 units CAPS Take 1,000 Units by mouth daily.    [provider]  cyclobenzaprine (FLEXERIL) 10 MG tablet TAKE 1 TABLET BY MOUTH AT BEDTIME AS NEEDED 04/04/19   [name], MD  esomeprazole (NEXIUM) 20 MG capsule Take 20 mg by mouth daily before breakfast.    [provider]    fluticasone (FLONASE) 50 MCG/ACT nasal spray Place 2 sprays into both nostrils daily as needed for allergies or rhinitis.    [provider]  gabapentin (NEURONTIN) 300 MG capsule TAKE 1 CAPSULE BY MOUTH TWICE DAILY 04/04/19   [name], MD  methocarbamol (ROBAXIN) 500 MG tablet TAKE 1 TABLET BY MOUTH THREE TIMES DAILY- DO NOT TAKE WITH CYCLOBENZAPRINE 04/20/19   [name], MD  predniSONE (DELTASONE) 5 MG tablet Take 4 tabs po qd x 4 days, 3  tabs po qd x 4 days, 2  tabs po qd x 4 days, 1  tab po qd x 4 days 03/02/19   Ofilia Neas, PA-C  sulfaSALAzine (AZULFIDINE) 500 MG EC tablet TAKE 2 TABLETS BY MOUTH TWICE DAILY 03/10/19   Deveshwar,  Abel Presto, MD    Allergies    Doxycycline and Humira [adalimumab]  Review of Systems   Review of Systems  Constitutional: Positive for diaphoresis. Negative for fever.  Eyes: Negative for redness.  Respiratory: Negative for cough and shortness of breath.   Cardiovascular: Positive for chest pain and palpitations. Negative for leg swelling.  Gastrointestinal: Negative for abdominal pain, nausea and vomiting.  Genitourinary: Negative for dysuria.  Musculoskeletal: Negative for back pain and neck pain.  Skin: Negative for rash.  Neurological: Negative for syncope and light-headedness.  Psychiatric/Behavioral: The patient is not nervous/anxious.     Physical Exam Updated Vital Signs BP 123/81 (BP Location: Right Arm)   Pulse 75   Temp 98 F (36.7 C) (Oral)   Resp 17   Ht 6\' 3"  (1.905 m)   Wt 119.7 kg   SpO2 98%   BMI 33.00 kg/m   Physical Exam Vitals and nursing note reviewed.  Constitutional:      Appearance: He is well-developed. He is not diaphoretic.  HENT:     Head: Normocephalic and atraumatic.     Mouth/Throat:     Mouth: Mucous membranes are not dry.  Eyes:     Conjunctiva/sclera: Conjunctivae normal.  Neck:     Vascular: Normal carotid pulses. No carotid bruit or JVD.     Trachea: Trachea normal. No  tracheal deviation.  Cardiovascular:     Rate and Rhythm: Normal rate. Rhythm irregular.     Pulses: No decreased pulses.     Heart sounds: Normal heart sounds, S1 normal and S2 normal. Heart sounds not distant. No murmur.     Comments: 90-100 Pulmonary:     Effort: Pulmonary effort is normal. No respiratory distress.     Breath sounds: Normal breath sounds. No wheezing.  Chest:     Chest wall: No tenderness.  Abdominal:     General: Bowel sounds are normal.     Palpations: Abdomen is soft.     Tenderness: There is no abdominal tenderness. There is no guarding or rebound.  Musculoskeletal:     Cervical back: Normal range of motion and neck supple. No muscular tenderness.  Skin:    General: Skin is warm and dry.     Coloration: Skin is not pale.  Neurological:     Mental Status: He is alert.     ED Results / Procedures / Treatments   Labs (all labs ordered are listed, but only abnormal results are displayed) Labs Reviewed  BASIC METABOLIC PANEL - Abnormal; Notable for the following components:      Result Value   Glucose, Bld 102 (*)    All other components within normal limits  CBC - Abnormal; Notable for the following components:   RBC 4.10 (*)    MCV 102.2 (*)    MCH 35.1 (*)    All other components within normal limits  TROPONIN I (HIGH SENSITIVITY) - Abnormal; Notable for the following components:   Troponin I (High Sensitivity) 57 (*)    All other components within normal limits  TSH  CBG MONITORING, ED  TROPONIN I (HIGH SENSITIVITY)    EKG EKG Interpretation  Date/Time:  Wednesday June 08 2019 19:07:27 EST Ventricular Rate:  78 PR Interval:  122 QRS Duration: 92 QT Interval:  348 QTC Calculation: 396 R Axis:   65 Text Interpretation: Normal sinus rhythm Cannot rule out Anterior infarct , age undetermined Abnormal ECG Confirmed by Veryl Speak [phone]) on 06/08/2019 7:19:37 PM   Radiology DG  Chest Portable 1 View  Result Date: 06/08/2019 CLINICAL DATA:   Chest pain EXAM: PORTABLE CHEST 1 VIEW COMPARISON:  05/19/2019 FINDINGS: The heart size and mediastinal contours are within normal limits. Both lungs are clear. No pleural effusion or pneumothorax. The visualized skeletal structures are unremarkable. IMPRESSION: No acute process in the chest. Electronically Signed   By: Macy Mis M.D.   On: 06/08/2019 18:04    Procedures Procedures (including critical care time)  Medications Ordered in ED Medications - No data to display  ED Course  I have reviewed the triage vital signs and the nursing notes.  Pertinent labs & imaging results that were available during my care of the patient were reviewed by me and considered in my medical decision making (see chart for details).  RN reported elevated heart rate to 170.  Patient seen and examined.  When entering room patient with atrial fibrillation 90-110.  Within several minutes, patient converted to normal sinus rhythm on the monitor in the 80s.  Awaiting lab work-up.  Vital signs reviewed and are as follows: BP 123/81 (BP Location: Right Arm)   Pulse 75   Temp 98 F (36.7 C) (Oral)   Resp 17   Ht 6\' 3"  (1.905 m)   Wt 119.7 kg   SpO2 98%   BMI 33.00 kg/m   9:06 PM patient has been stable during ED stay.  Troponin 19 --> 57.  This was likely due to elevated heart rate due to atrial fibrillation with RVR.  Patient discussed with and seen by Dr. Stark Jock.  In setting of documented elevated heart rates and completely normal coronary catheterization 15 months ago, do not suspect ACS.  Patient is currently symptom free.  He will be discharged home on 81 mg of aspirin.  Encourage PCP follow-up tomorrow for consideration of cardiology referral and Holter monitor.  Patient states that he is unable to see a cardiologist here at [facility] currently due to insurance and cannot get back in and see [name].  Encourage patient to take baby aspirin.  Patient was counseled to return with severe chest pain, especially  if the pain is crushing or pressure-like and spreads to the arms, back, neck, or jaw, or if they have sweating, nausea, or shortness of breath with the pain. They were encouraged to call 911 with these symptoms.   They were also told to return if their chest pain gets worse and does not go away with rest, they have an attack of chest pain lasting longer than usual despite rest and treatment with the medications their caregiver has prescribed, if they wake from sleep with chest pain or shortness of breath, if they feel dizzy or faint, if they have chest pain not typical of their usual pain, or if they have any other emergent concerns regarding their health.  The patient verbalized understanding and agreed.   CHA2DS2-VASc score equals 0.    MDM Rules/Calculators/A&P                      Patient with palpitations over the past 1 to 2 months, recently improved, but worsened today.  Patient was found to have rapid atrial fibrillation confirmed on EKG.  Patient took home Cardizem which was left over from SVT episodes several years ago.  By the time the patient arrived in the emergency department and was evaluated, heart rate was improved and patient spontaneously converted and remained in normal sinus rhythm.  Patient has a troponin leak, not  unexpected for recent rapid rate.  It is very reassuring that patient had a completely normal cardiac catheterization just over a year ago.  His chest pain symptoms were associated with his A. fib with RVR.  Do not suspect ACS, dissection at this time.  Patient has good primary care follow-up and seems reliable.  He was just there yesterday.  Encouraged him to contact his doctor for cardiology referral.  Ambulatory referral to atrial fibrillation clinic made, however it is unclear if patient will be able to follow-up given his current insurance situation.  Strict return instructions for chest pain as above.  Patient seems reliable to return if symptoms change or  worsen.   Final Clinical Impression(s) / ED Diagnoses Final diagnoses:  Atrial fibrillation with RVR [facility])    Rx / DC Orders ED Discharge Orders         Ordered    Amb referral to AFIB Clinic     06/08/19 1749           Carlisle Cater, Hershal Coria 06/08/19 2147    Veryl Speak, MD 06/09/19 1515

## 2019-06-08 NOTE — ED Notes (Signed)
Josh PA at bedside   

## 2019-06-16 ENCOUNTER — Other Ambulatory Visit: Payer: Self-pay | Admitting: Rheumatology

## 2019-06-27 IMAGING — XA DG FLUORO GUIDE LUMBAR PUNCTURE
1 series · 1 of 1 positions shown · non-contrast
Comparison: none

CLINICAL DATA: Weakness.  Gait disturbance.  Back pain.  Ataxia.

[Series 1: ortho standard · 1 of 1 slices shown]
[im 1/1]
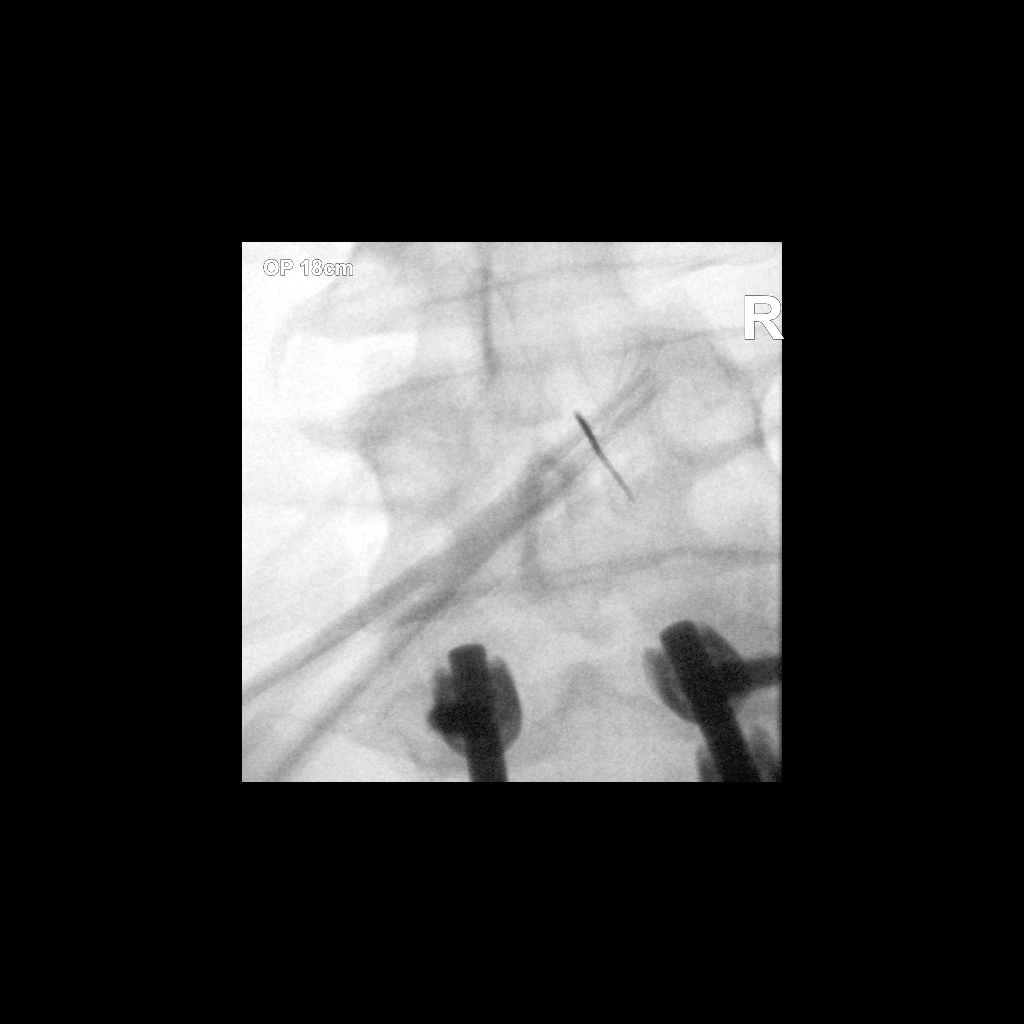

[1 of 1 positions shown; findings below may reference images not displayed]

EXAM:
DIAGNOSTIC LUMBAR PUNCTURE UNDER FLUOROSCOPIC GUIDANCE

FLUOROSCOPY TIME:  Fluoroscopy Time: 0 minutes 25 seconds.
micro gray meter squared

PROCEDURE:
Informed consent was obtained from the patient prior to the
procedure, including potential complications of headache, allergy,
and pain. With the patient prone, the lower back was prepped with
Betadine. 1% Lidocaine was used for local anesthesia. Lumbar
puncture was performed at the right L2-3 level using a 6 inch 20
gauge needle with return of clear CSF with an opening pressure of 18
cm water, measured in the lateral decubitus position. Eleven ml of
CSF were obtained for laboratory studies. The patient tolerated the
procedure well and there were no apparent complications.
IMPRESSION: Lumbar puncture on the right at L2-3.

## 2019-09-19 ENCOUNTER — Telehealth: Payer: Self-pay | Admitting: Rheumatology

## 2019-09-19 NOTE — Telephone Encounter (Signed)
Katie from [facility] left a voicemail stating patient's most recent prior authorization from [employer] was submitted and denied for Actemra and the denial letter was faxed to [name] by mistake.  It should have been faxed to patient's new Rheumatologist.  If the office [facility] has a copy of the denial letter, please fax to [phone]   If you have any questions, please call back at [phone]

## 2019-09-19 NOTE — Telephone Encounter (Signed)
Copy of letter was shredded as patient is no longer a patient in our office.
